# Patient Record
Sex: Female | Born: 1980 | Race: Black or African American | Hispanic: No | Marital: Single | Smoking: Current every day smoker
Health system: Southern US, Community
[De-identification: ages and names within clinical notes are randomized; demographics above are authoritative.]

## PROBLEM LIST (undated history)

## (undated) DIAGNOSIS — I2699 Other pulmonary embolism without acute cor pulmonale: Secondary | ICD-10-CM

## (undated) DIAGNOSIS — E119 Type 2 diabetes mellitus without complications: Secondary | ICD-10-CM

## (undated) DIAGNOSIS — I82409 Acute embolism and thrombosis of unspecified deep veins of unspecified lower extremity: Secondary | ICD-10-CM

## (undated) DIAGNOSIS — G4730 Sleep apnea, unspecified: Secondary | ICD-10-CM

## (undated) DIAGNOSIS — J45909 Unspecified asthma, uncomplicated: Secondary | ICD-10-CM

## (undated) DIAGNOSIS — E079 Disorder of thyroid, unspecified: Secondary | ICD-10-CM

## (undated) HISTORY — PX: OTHER SURGICAL HISTORY: SHX169

## (undated) HISTORY — PX: IVC FILTER PLACEMENT (ARMC HX): HXRAD1551

---

## 2016-01-05 ENCOUNTER — Emergency Department (HOSPITAL_COMMUNITY): Payer: Medicaid - Out of State

## 2016-01-05 ENCOUNTER — Encounter (HOSPITAL_COMMUNITY): Payer: Self-pay | Admitting: Emergency Medicine

## 2016-01-05 ENCOUNTER — Observation Stay (HOSPITAL_COMMUNITY)
Admission: EM | Admit: 2016-01-05 | Discharge: 2016-01-06 | Disposition: A | Discharge status: Home / Self Care | Payer: Medicaid - Out of State | Attending: Internal Medicine | Admitting: Internal Medicine

## 2016-01-05 DIAGNOSIS — Z6841 Body Mass Index (BMI) 40.0 and over, adult: Secondary | ICD-10-CM | POA: Insufficient documentation

## 2016-01-05 DIAGNOSIS — G4730 Sleep apnea, unspecified: Secondary | ICD-10-CM | POA: Diagnosis not present

## 2016-01-05 DIAGNOSIS — I2699 Other pulmonary embolism without acute cor pulmonale: Secondary | ICD-10-CM | POA: Diagnosis present

## 2016-01-05 DIAGNOSIS — J45909 Unspecified asthma, uncomplicated: Secondary | ICD-10-CM | POA: Diagnosis not present

## 2016-01-05 DIAGNOSIS — Z9114 Patient's other noncompliance with medication regimen: Secondary | ICD-10-CM | POA: Diagnosis not present

## 2016-01-05 DIAGNOSIS — M7989 Other specified soft tissue disorders: Secondary | ICD-10-CM | POA: Diagnosis not present

## 2016-01-05 DIAGNOSIS — M79662 Pain in left lower leg: Secondary | ICD-10-CM | POA: Insufficient documentation

## 2016-01-05 DIAGNOSIS — Z86711 Personal history of pulmonary embolism: Secondary | ICD-10-CM | POA: Diagnosis not present

## 2016-01-05 DIAGNOSIS — Z86718 Personal history of other venous thrombosis and embolism: Secondary | ICD-10-CM | POA: Insufficient documentation

## 2016-01-05 DIAGNOSIS — E119 Type 2 diabetes mellitus without complications: Secondary | ICD-10-CM | POA: Diagnosis not present

## 2016-01-05 DIAGNOSIS — Z7901 Long term (current) use of anticoagulants: Secondary | ICD-10-CM | POA: Diagnosis not present

## 2016-01-05 DIAGNOSIS — F17210 Nicotine dependence, cigarettes, uncomplicated: Secondary | ICD-10-CM | POA: Insufficient documentation

## 2016-01-05 HISTORY — DX: Acute embolism and thrombosis of unspecified deep veins of unspecified lower extremity: I82.409

## 2016-01-05 HISTORY — DX: Other pulmonary embolism without acute cor pulmonale: I26.99

## 2016-01-05 HISTORY — DX: Type 2 diabetes mellitus without complications: E11.9

## 2016-01-05 HISTORY — DX: Unspecified asthma, uncomplicated: J45.909

## 2016-01-05 HISTORY — DX: Sleep apnea, unspecified: G47.30

## 2016-01-05 HISTORY — DX: Disorder of thyroid, unspecified: E07.9

## 2016-01-05 LAB — BASIC METABOLIC PANEL
Anion gap: 7 (ref 5–15)
BUN: 13 mg/dL (ref 6–20)
CO2: 29 mmol/L (ref 22–32)
Calcium: 8.9 mg/dL (ref 8.9–10.3)
Chloride: 106 mmol/L (ref 101–111)
Creatinine, Ser: 1 mg/dL (ref 0.44–1.00)
GFR calc Af Amer: >60 mL/min (ref >60)
GFR calc non Af Amer: >60 mL/min (ref >60)
Glucose, Bld: 91 mg/dL (ref 65–99)
Potassium: 4.2 mmol/L (ref 3.5–5.1)
Sodium: 142 mmol/L (ref 135–145)

## 2016-01-05 LAB — GLUCOSE, CAPILLARY: Glucose-Capillary: 96 mg/dL (ref 65–99)

## 2016-01-05 LAB — PROTIME-INR
INR: 1.45 (ref 0.00–1.49)
Prothrombin Time: 17.7 seconds — ABNORMAL HIGH (ref 11.6–15.2)

## 2016-01-05 LAB — CBC WITH DIFFERENTIAL/PLATELET
Basophils Absolute: 0 10*3/uL (ref 0.0–0.1)
Basophils Relative: 0 %
Eosinophils Absolute: 0.1 10*3/uL (ref 0.0–0.7)
Eosinophils Relative: 2 %
HCT: 35.9 % — ABNORMAL LOW (ref 36.0–46.0)
Hemoglobin: 11.4 g/dL — ABNORMAL LOW (ref 12.0–15.0)
Lymphocytes Relative: 30 %
Lymphs Abs: 1.6 10*3/uL (ref 0.7–4.0)
MCH: 25.2 pg — ABNORMAL LOW (ref 26.0–34.0)
MCHC: 31.8 g/dL (ref 30.0–36.0)
MCV: 79.2 fL (ref 78.0–100.0)
Monocytes Absolute: 0.2 10*3/uL (ref 0.1–1.0)
Monocytes Relative: 4 %
Neutro Abs: 3.5 10*3/uL (ref 1.7–7.7)
Neutrophils Relative %: 64 %
Platelets: 362 10*3/uL (ref 150–400)
RBC: 4.53 MIL/uL (ref 3.87–5.11)
RDW: 16.1 % — ABNORMAL HIGH (ref 11.5–15.5)
WBC: 5.4 10*3/uL (ref 4.0–10.5)

## 2016-01-05 LAB — D-DIMER, QUANTITATIVE: D-Dimer, Quant: 1.3 ug/mL-FEU — ABNORMAL HIGH (ref 0.00–0.50)

## 2016-01-05 LAB — HCG, SERUM, QUALITATIVE: Preg, Serum: NEGATIVE

## 2016-01-05 MED ORDER — ENOXAPARIN SODIUM 150 MG/ML ~~LOC~~ SOLN
1.0000 mg/kg | Freq: Two times a day (BID) | SUBCUTANEOUS | Status: DC
  Filled 2016-01-05 (×5): qty 0.86

## 2016-01-05 MED ORDER — MORPHINE SULFATE (PF) 2 MG/ML IV SOLN
2.0000 mg | INTRAVENOUS | Status: DC | PRN
  Administered 2016-01-05: 2 mg via INTRAVENOUS
  Filled 2016-01-05: qty 1

## 2016-01-05 MED ORDER — ONDANSETRON HCL 4 MG PO TABS: 4.0000 mg | ORAL_TABLET | Freq: Four times a day (QID) | ORAL | Status: DC | PRN

## 2016-01-05 MED ORDER — SODIUM CHLORIDE 0.9 % IV SOLN
INTRAVENOUS | Status: DC
  Administered 2016-01-06: 05:00:00 via INTRAVENOUS

## 2016-01-05 MED ORDER — INSULIN ASPART 100 UNIT/ML ~~LOC~~ SOLN
0.0000 [IU] | Freq: Three times a day (TID) | SUBCUTANEOUS | Status: DC
  Administered 2016-01-06: 1 [IU] via SUBCUTANEOUS

## 2016-01-05 MED ORDER — HYDROMORPHONE HCL 1 MG/ML IJ SOLN
2.0000 mg | INTRAMUSCULAR | Status: DC | PRN
  Administered 2016-01-05 – 2016-01-06 (×4): 2 mg via INTRAVENOUS
  Filled 2016-01-05 (×4): qty 2

## 2016-01-05 MED ORDER — ONDANSETRON HCL 4 MG/2ML IJ SOLN: 4.0000 mg | Freq: Four times a day (QID) | INTRAMUSCULAR | Status: DC | PRN

## 2016-01-05 MED ORDER — ONDANSETRON HCL 4 MG/2ML IJ SOLN
4.0000 mg | Freq: Once | INTRAMUSCULAR | Status: AC
  Administered 2016-01-05: 4 mg via INTRAVENOUS
  Filled 2016-01-05: qty 2

## 2016-01-05 MED ORDER — ENOXAPARIN SODIUM 150 MG/ML ~~LOC~~ SOLN
1.0000 mg/kg | Freq: Once | SUBCUTANEOUS | Status: AC
  Administered 2016-01-05: 130 mg via SUBCUTANEOUS
  Filled 2016-01-05: qty 1

## 2016-01-05 MED ORDER — IOHEXOL 350 MG/ML SOLN
100.0000 mL | Freq: Once | INTRAVENOUS | Status: AC | PRN
  Administered 2016-01-05: 100 mL via INTRAVENOUS

## 2016-01-05 MED ORDER — FENTANYL CITRATE (PF) 100 MCG/2ML IJ SOLN
50.0000 ug | INTRAMUSCULAR | Status: DC | PRN
  Administered 2016-01-05: 50 ug via INTRAVENOUS
  Filled 2016-01-05: qty 2

## 2016-01-05 NOTE — ED Notes (Addendum)
Pt reports LT calf pain and edema x 1 week. Pt also reports edema to LT foot. Pt also reports mild SOB. Pt reports chest pain earlier in the week, but reports none today. Pt hx of PE and DVT. Pt on Coumadin.

## 2016-01-05 NOTE — ED Provider Notes (Signed)
CSN: [id number]     Arrival date & time 01/05/16  1708 History   First MD Initiated Contact with Patient 01/05/16 1721     Chief Complaint  Patient presents with  . Leg Pain     HPI  She presents for valuation of leg pain and swelling, chest pain difficult to breathing with exertion. Reports that years ago in [place] she had a pulmonary embolus. She states she thinks this was anywhere from 3-5 years ago but is uncertain. Also was uncertain why, but required IVC filter, which she still has.  Has been on Coumadin since that time. However, she states she frequently forgets to take it on average 3-4 times per week does take her Coumadin. For about a week she's had pain and swelling in her left calf and now shortness of breath and chest pain for the last 2 days, although not today. States she had an ultrasound done by a physician at an urgent care or Hospital in [place] on Thursday and was told that it was negative. She stated "is still getting worse and started having trouble breathing so I came here".  Past Medical History  Diagnosis Date  . Asthma   . DVT (deep venous thrombosis) (HCC)   . Pulmonary emboli (HCC)   . Diabetes mellitus without complication (HCC)   . Thyroid disease   . Sleep apnea    Past Surgical History  Procedure Laterality Date  . Ivc filter placement ([facility] hx)     No family history on file. Social History  Substance Use Topics  . Smoking status: Current Every Day Smoker -- 1.50 packs/day    Types: Cigarettes  . Smokeless tobacco: None  . Alcohol Use: No   OB History    No data available     Review of Systems  Constitutional: Negative for fever, chills, diaphoresis, appetite change and fatigue.  HENT: Negative for mouth sores, sore throat and trouble swallowing.   Eyes: Negative for visual disturbance.  Respiratory: Positive for shortness of breath. Negative for cough, chest tightness and wheezing.   Cardiovascular: Positive for chest pain and leg  swelling.  Gastrointestinal: Negative for nausea, vomiting, abdominal pain, diarrhea and abdominal distention.  Endocrine: Negative for polydipsia, polyphagia and polyuria.  Genitourinary: Negative for dysuria, frequency and hematuria.  Musculoskeletal: Negative for gait problem.  Skin: Negative for color change, pallor and rash.  Neurological: Negative for dizziness, syncope, light-headedness and headaches.  Hematological: Does not bruise/bleed easily.  Psychiatric/Behavioral: Negative for behavioral problems and confusion.      Allergies  Review of patient's allergies indicates no known allergies.  Home Medications   Prior to Admission medications   Medication Sig Start Date End Date Taking? Authorizing Provider  acetaminophen (TYLENOL) 500 MG tablet Take 1,000 mg by mouth every 6 (six) hours as needed for mild pain.   Yes Historical Provider, MD  VENTOLIN HFA 108 (90 Base) MCG/ACT inhaler Inhale 2 puffs into the lungs every 4 (four) hours. 10/29/15  Yes Historical Provider, MD  warfarin (COUMADIN) 10 MG tablet Take 10 mg by mouth daily. 08/26/15  Yes Historical Provider, MD   BP 105/77 mmHg  Pulse 97  Temp(Src) 98.2 F (36.8 C) (Oral)  Resp 20  Ht 5\' 5"  (1.651 m)  Wt 286 lb (129.729 kg)  BMI 47.59 kg/m2  SpO2 93%  LMP 12/29/2015 Physical Exam  Constitutional: She is oriented to person, place, and time. She appears well-developed and well-nourished. No distress.  HENT:  Head: Normocephalic.  Eyes: Conjunctivae are normal. Pupils are equal, round, and reactive to light. No scleral icterus.  Neck: Normal range of motion. Neck supple. No thyromegaly present.  Cardiovascular: Normal rate and regular rhythm.  Exam reveals no gallop and no friction rub.   No murmur heard. Lungs clear. She is not tachypneic or tachycardic.  Pulmonary/Chest: Effort normal and breath sounds normal. No respiratory distress. She has no wheezes. She has no rales.  Abdominal: Soft. Bowel sounds are  normal. She exhibits no distension. There is no tenderness. There is no rebound.  Musculoskeletal: Normal range of motion.  Neurological: She is alert and oriented to person, place, and time.  Skin: Skin is warm and dry. No rash noted.  Increased circumference of left lower leg from the knee and inferiorly. Erythematous. Not warm. Well-perfused.  Psychiatric: She has a normal mood and affect. Her behavior is normal.    ED Course  Procedures (including critical care time) Labs Review Labs Reviewed  CBC WITH DIFFERENTIAL/PLATELET - Abnormal; Notable for the following:    Hemoglobin 11.4 (*)    HCT 35.9 (*)    MCH 25.2 (*)    RDW 16.1 (*)    All other components within normal limits  PROTIME-INR - Abnormal; Notable for the following:    Prothrombin Time 17.7 (*)    All other components within normal limits  D-DIMER, QUANTITATIVE (NOT AT [facility]) - Abnormal; Notable for the following:    D-Dimer, Quant 1.30 (*)    All other components within normal limits  BASIC METABOLIC PANEL  HCG, SERUM, QUALITATIVE    Imaging Review Ct Angio Chest Pe W/cm &/or Wo Cm  01/05/2016  CLINICAL DATA:  Left calf pain and swelling for 1 week. Chest pain and shortness of breath. EXAM: CT ANGIOGRAPHY CHEST WITH CONTRAST TECHNIQUE: Multidetector CT imaging of the chest was performed using the standard protocol during bolus administration of intravenous contrast. Multiplanar CT image reconstructions and MIPs were obtained to evaluate the vascular anatomy. CONTRAST:  OMNIPAQUE IOHEXOL 350 MG/ML SOLN COMPARISON:  None. FINDINGS: Mediastinum/Nodes: No breast masses, supraclavicular or axillary adenopathy. Small scattered lymph nodes are noted bilaterally. The heart is normal in size. No pericardial effusion. The aorta is normal in caliber. No dissection. The branch vessels are patent. Suboptimal opacification of the pulmonary arteries but there are bilateral filling defects consistent with pulmonary embolism  involving second and third order vessels. Small scattered mediastinal and hilar lymph nodes but no mass or adenopathy. Lungs/Pleura: No acute pulmonary findings. No worrisome pulmonary lesions. No pleural effusion. Upper abdomen: The upper abdomen is unremarkable. Musculoskeletal: No significant bony findings. Review of the MIP images confirms the above findings. IMPRESSION: 1. Suboptimal examination but definite findings for bilateral pulmonary emboli. 2. Normal thoracic aorta. 3. No acute pulmonary findings. Electronically Signed   By: Rudie Meyer M.D.   On: 01/05/2016 19:39   I have personally reviewed and evaluated these images and lab results as part of my medical decision-making.   EKG Interpretation   Date/Time:  Sunday Jan 05 2016 17:32:31 EDT Ventricular Rate:  94 PR Interval:  153 QRS Duration: 88 QT Interval:  337 QTC Calculation: 421 R Axis:   55 Text Interpretation:  Sinus rhythm Confirmed by Fayrene Fearing  MD, Jillaine Waren ([id number]) on  01/05/2016 5:39:15 PM      MDM   Final diagnoses:  Other acute pulmonary embolism without acute cor pulmonale (HCC)    Patient is uncertain about her previous embolus. She thinks it was anywhere from  3-5 years ago in [place] urgent. Today on repeat CT does have multiple small segmental pulmonary emboli. Clinically, radiographically, no sign of right heart strain. Hearing is stable. Not hypoxemic or tachycardic.  I discussed the case with Dr. Sharl Ma. Patient given Lovenox. Will be admitted.    Rolland Porter, MD 01/05/16 2032

## 2016-01-05 NOTE — H&P (Signed)
TRH H&P   Patient Demographics:    Donna Douglas, is a 35 y.o. female  MRN: [id number]   DOB - 15-Sep-1980  Admit Date - 01/05/2016  Outpatient Primary MD for the patient is PROVIDER NOT IN SYSTEM  Referring MD/NP/PA:  Dr. Fayrene Fearing Outpatient Specialists: cardiology  Patient coming from: home  Chief Complaint  Patient presents with  . Leg Pain      HPI:    Donna Douglas  is a 35 y.o. female, with history of DVT, embolism, diabetes mellitus who came to the hospital for worsening leg pain and swelling. Patient has a history of pulmonary embolism, DVT, status post IVC filter placed in 2008. She takes Coumadin at home, and says that she has been noncompliant with taking medications. Patient was seen at [facility] 2 days ago, where she underwent ultrasound of the lower extremity which was negative for DVT. She continues to have cramping pain in the left lower extremity, so today she came to the hospital. She also has developed shortness of breath on exertion.  Patient started taking birth control pills norethindrone a month ago, and was told that it is a low risk for developing DVT.  In the ED CTA chest was done which showed bilateral pulmonary embolism. She denies nausea vomiting or diarrhea. Denies chest pain.      Review of systems:    In addition to the HPI above,  No Fever-chills, No Headache, No changes with Vision or hearing, No problems swallowing food or Liquids,  No Abdominal pain, No Nausea or Vommitting, Bowel movements are regular, No Blood in stool or Urine, No dysuria, No new skin rashes or bruises, No new joints pains-aches,  No new weakness, tingling, numbness in any extremity, No recent weight gain or loss, No polyuria, polydypsia or polyphagia, No significant Mental Stressors.  A full 10 point Review of Systems was done, except as stated above, all other Review of  Systems were negative.   With Past History of the following :    Past Medical History  Diagnosis Date  . Asthma   . DVT (deep venous thrombosis) (HCC)   . Pulmonary emboli (HCC)   . Diabetes mellitus without complication (HCC)   . Thyroid disease   . Sleep apnea       Past Surgical History  Procedure Laterality Date  . Ivc filter placement ([facility] hx)        Social History:     Social History  Substance Use Topics  . Smoking status: Current Every Day Smoker -- 1.50 packs/day    Types: Cigarettes  . Smokeless tobacco: Not on file  . Alcohol Use: No     Lives - At home  Mobility - no limitation of mobility     Family History :   No family history of heart problems, no stroke, no history of blood clots in family   Home Medications:   Prior to Admission medications   Medication Sig Start Date End Date Taking? Authorizing Provider  acetaminophen (  TYLENOL) 500 MG tablet Take 1,000 mg by mouth every 6 (six) hours as needed for mild pain.   Yes Historical Provider, MD  VENTOLIN HFA 108 (90 Base) MCG/ACT inhaler Inhale 2 puffs into the lungs every 4 (four) hours. 10/29/15  Yes Historical Provider, MD  warfarin (COUMADIN) 10 MG tablet Take 10 mg by mouth daily. 08/26/15  Yes Historical Provider, MD     Allergies:    No Known Allergies   Physical Exam:   Vitals  Blood pressure 125/84, pulse 94, temperature 98.2 F (36.8 C), temperature source Oral, resp. rate 20, height  (1.651 m), weight 129.729 kg (286 lb), last menstrual period 12/29/2015, SpO2 98 %.   1. General Obese female lying in bed in NAD, cooperative with exam  2. Normal affect and insight, Not Suicidal or Homicidal, Awake Alert, Oriented X 3.  3. No F.N deficits, ALL C.Nerves Intact, Strength 5/5 all 4 extremities, Sensation intact all 4 extremities, Plantars down going.  4. Ears and Eyes appear Normal, Conjunctivae clear, PERRLA. Moist Oral Mucosa.  5. Supple Neck, No JVD, No cervical  lymphadenopathy appriciated, No Carotid Bruits.  6. Symmetrical Chest wall movement, Good air movement bilaterally, CTAB. Bilateral edema of the lower extremities, left more than right. Homans sign positive  7. RRR, No Gallops, Rubs or Murmurs, No Parasternal Heave.  8. Positive Bowel Sounds, Abdomen Soft, No tenderness, No organomegaly appriciated,No rebound -guarding or rigidity.  9.  No Cyanosis, Normal Skin Turgor, No Skin Rash or Bruise.  10. Good muscle tone,  joints appear normal , no effusions, Normal ROM.      Data Review:    CBC  Recent Labs Lab 01/05/16 1742  WBC 5.4  HGB 11.4*  HCT 35.9*  PLT 362  MCV 79.2  MCH 25.2*  MCHC 31.8  RDW 16.1*  LYMPHSABS 1.6  MONOABS 0.2  EOSABS 0.1  BASOSABS 0.0   ------------------------------------------------------------------------------------------------------------------  Chemistries   Recent Labs Lab 01/05/16 1742  NA 142  K 4.2  CL 106  CO2 29  GLUCOSE 91  BUN 13  CREATININE 1.00  CALCIUM 8.9   ------------------------------------------------------------------------------------------------------------------ estimated creatinine clearance is 106.7 mL/min (by C-G formula based on Cr of 1). ------------------------------------------------------------------------------------------------------------------ No results for input(s): TSH, T4TOTAL, T3FREE, THYROIDAB in the last 72 hours.  Invalid input(s): FREET3  Coagulation profile  Recent Labs Lab 01/05/16 1742  INR 1.45   -------------------------------------------------------------------------------------------------------------------  Recent Labs  01/05/16 1742  DDIMER 1.30*   -------------------------------------------------------------------------------------------------------------------  Cardiac Enzymes No results for input(s): CKMB, TROPONINI, MYOGLOBIN in the last 168 hours.  Invalid input(s):  CK ------------------------------------------------------------------------------------------------------------------ No results found for: BNP   ---------------------------------------------------------------------------------------------------------------  Urinalysis No results found for: COLORURINE, APPEARANCEUR, LABSPEC, PHURINE, GLUCOSEU, HGBUR, BILIRUBINUR, KETONESUR, PROTEINUR, UROBILINOGEN, NITRITE, LEUKOCYTESUR  ----------------------------------------------------------------------------------------------------------------   Imaging Results:    Ct Angio Chest Pe W/cm &/or Wo Cm  01/05/2016  CLINICAL DATA:  Left calf pain and swelling for 1 week. Chest pain and shortness of breath. EXAM: CT ANGIOGRAPHY CHEST WITH CONTRAST TECHNIQUE: Multidetector CT imaging of the chest was performed using the standard protocol during bolus administration of intravenous contrast. Multiplanar CT image reconstructions and MIPs were obtained to evaluate the vascular anatomy. CONTRAST:  OMNIPAQUE IOHEXOL 350 MG/ML SOLN COMPARISON:  None. FINDINGS: Mediastinum/Nodes: No breast masses, supraclavicular or axillary adenopathy. Small scattered lymph nodes are noted bilaterally. The heart is normal in size. No pericardial effusion. The aorta is normal in caliber. No dissection. The branch vessels are patent. Suboptimal opacification of the pulmonary  arteries but there are bilateral filling defects consistent with pulmonary embolism involving second and third order vessels. Small scattered mediastinal and hilar lymph nodes but no mass or adenopathy. Lungs/Pleura: No acute pulmonary findings. No worrisome pulmonary lesions. No pleural effusion. Upper abdomen: The upper abdomen is unremarkable. Musculoskeletal: No significant bony findings. Review of the MIP images confirms the above findings. IMPRESSION: 1. Suboptimal examination but definite findings for bilateral pulmonary emboli. 2. Normal thoracic aorta. 3. No  acute pulmonary findings. Electronically Signed   By: Rudie MeyerP.  Gallerani M.D.   On: 01/05/2016 19:39    My personal review of EKG: Rhythm NSR, Rate  *94 /min, QTc 421, no Acute ST changes   Assessment & Plan:    Active Problems:   Pulmonary emboli (HCC)   Pulmonary embolism (HCC)     1. Pulmonary embolism- CT angiogram of the chest showed findings for bilateral pulmonary embolism, will admit the patient and start Lovenox full dose. Hold Coumadin at this time. Patient might need a NOAC at the time of discharge. Also recommended to stop taking Norethindrone birth control pills.  2. Left lower extremity swelling- we'll obtain bilateral venous duplex in a.m. to rule out DVT. Patient already has IVC filter in place. Continue Lovenox as above. 3. Diabetes mellitus- patient was taking Januvia sometime ago, she is currently not taking medication as she has no PCP. Will check hemoglobin A1c and start sliding scale insulin with NovoLog.   DVT Prophylaxis-   Lovenox   AM Labs Ordered, also please review Full Orders  Family Communication: No family present at bedside  Code Status Full code  Admission status: *Observation  Time spent in minutes : 60 minutes   Tanina Barb S M.D on 01/05/2016 at 8:59 PM  Between 7am to 7pm - Pager - [phone]. After 7pm go to [email or web address] - password [email or web address]  [facility] - Office  [phone]

## 2016-01-06 ENCOUNTER — Observation Stay (HOSPITAL_COMMUNITY): Payer: Medicaid - Out of State

## 2016-01-06 ENCOUNTER — Other Ambulatory Visit (HOSPITAL_COMMUNITY): Payer: Medicaid - Out of State

## 2016-01-06 ENCOUNTER — Encounter (HOSPITAL_COMMUNITY): Payer: Self-pay | Admitting: *Deleted

## 2016-01-06 DIAGNOSIS — I2609 Other pulmonary embolism with acute cor pulmonale: Secondary | ICD-10-CM

## 2016-01-06 LAB — GLUCOSE, CAPILLARY
Glucose-Capillary: 107 mg/dL — ABNORMAL HIGH (ref 65–99)
Glucose-Capillary: 131 mg/dL — ABNORMAL HIGH (ref 65–99)
Glucose-Capillary: 145 mg/dL — ABNORMAL HIGH (ref 65–99)
Glucose-Capillary: 83 mg/dL (ref 65–99)

## 2016-01-06 LAB — COMPREHENSIVE METABOLIC PANEL
ALT: 13 U/L — ABNORMAL LOW (ref 14–54)
AST: 11 U/L — ABNORMAL LOW (ref 15–41)
Albumin: 3.3 g/dL — ABNORMAL LOW (ref 3.5–5.0)
Alkaline Phosphatase: 86 U/L (ref 38–126)
Anion gap: 6 (ref 5–15)
BUN: 11 mg/dL (ref 6–20)
CO2: 28 mmol/L (ref 22–32)
Calcium: 8.6 mg/dL — ABNORMAL LOW (ref 8.9–10.3)
Chloride: 104 mmol/L (ref 101–111)
Creatinine, Ser: 0.86 mg/dL (ref 0.44–1.00)
GFR calc Af Amer: >60 mL/min (ref >60)
GFR calc non Af Amer: >60 mL/min (ref >60)
Glucose, Bld: 126 mg/dL — ABNORMAL HIGH (ref 65–99)
Potassium: 4.1 mmol/L (ref 3.5–5.1)
Sodium: 138 mmol/L (ref 135–145)
Total Bilirubin: 0.2 mg/dL — ABNORMAL LOW (ref 0.3–1.2)
Total Protein: 6.7 g/dL (ref 6.5–8.1)

## 2016-01-06 LAB — CBC
HCT: 34.7 % — ABNORMAL LOW (ref 36.0–46.0)
Hemoglobin: 11.1 g/dL — ABNORMAL LOW (ref 12.0–15.0)
MCH: 25.7 pg — ABNORMAL LOW (ref 26.0–34.0)
MCHC: 32 g/dL (ref 30.0–36.0)
MCV: 80.3 fL (ref 78.0–100.0)
Platelets: 332 10*3/uL (ref 150–400)
RBC: 4.32 MIL/uL (ref 3.87–5.11)
RDW: 16.2 % — ABNORMAL HIGH (ref 11.5–15.5)
WBC: 5.7 10*3/uL (ref 4.0–10.5)

## 2016-01-06 MED ORDER — APIXABAN 5 MG PO TABS: 5.0000 mg | ORAL_TABLET | Freq: Two times a day (BID) | ORAL | Status: DC

## 2016-01-06 MED ORDER — DIPHENHYDRAMINE HCL 25 MG PO CAPS
25.0000 mg | ORAL_CAPSULE | Freq: Once | ORAL | Status: AC
  Administered 2016-01-06: 25 mg via ORAL
  Filled 2016-01-06: qty 1

## 2016-01-06 MED ORDER — APIXABAN 5 MG PO TABS: 10.0000 mg | ORAL_TABLET | Freq: Two times a day (BID) | ORAL | Status: DC

## 2016-01-06 MED ORDER — APIXABAN 5 MG PO TABS
10.0000 mg | ORAL_TABLET | Freq: Two times a day (BID) | ORAL | Status: DC
  Administered 2016-01-06: 10 mg via ORAL
  Filled 2016-01-06: qty 2

## 2016-01-06 MED ORDER — IBUPROFEN 100 MG/5ML PO SUSP
400.0000 mg | Freq: Three times a day (TID) | ORAL | Status: DC | PRN
  Administered 2016-01-06: 400 mg via ORAL
  Filled 2016-01-06: qty 20

## 2016-01-06 NOTE — Progress Notes (Signed)
ANTICOAGULATION CONSULT NOTE - Initial Consult  [facility]  No Known Allergies  Patient Measurements: Height: 5\' 5"  (165.1 cm) Weight: 286 lb (129.729 kg) IBW/kg (Calculated) : 57  Vital Signs: Temp: 97.9 F (36.6 C) (05/08 0516) Temp Source: Oral (05/08 0516) BP: 116/72 mmHg (05/08 0516) Pulse Rate: 80 (05/08 0516)  Labs:  Recent Labs  01/05/16 1742 01/06/16 0507  HGB 11.4* 11.1*  HCT 35.9* 34.7*  PLT 362 332  LABPROT 17.7*  --   INR 1.45  --   CREATININE 1.00 0.86   Estimated Creatinine Clearance: 124.1 mL/min (by C-G formula based on Cr of 0.86).  Medical History: Past Medical History  Diagnosis Date  . Asthma   . DVT (deep venous thrombosis) (HCC)   . Pulmonary emboli (HCC)   . Diabetes mellitus without complication (HCC)   . Thyroid disease   . Sleep apnea    Medications:  Prescriptions prior to admission  Medication Sig Dispense Refill Last Dose  . acetaminophen (TYLENOL) 500 MG tablet Take 1,000 mg by mouth every 6 (six) hours as needed for mild pain.   01/05/2016  . VENTOLIN HFA 108 (90 Base) MCG/ACT inhaler Inhale 2 puffs into the lungs every 4 (four) hours.  0 01/05/2016  . warfarin (COUMADIN) 10 MG tablet Take 10 mg by mouth daily.   01/04/2016 at 1930   Assessment: 35yo female with h/o DVT, diabetes, PE who presented for worsening leg pain and swelling.  Pt was started on Lovenox.  Pt was reportedly on Coumadin PTA.  INR was 1.45 yesterday when checked.  CBC appears stable.  Pt received last dose of Lovenox last pm.  Now asked to switch pt to Eliquis for treatment of PE.  Goal of Therapy:  Full dose anticoagulation for PE Monitor platelets by anticoagulation protocol: Yes   Plan:  Eliquis 10mg  PO BID x 7 days then 5mg  PO BID thereafter Provide education Monitor for s/sx of bleeding complications  Valrie HartHall, Chelsye Suhre A 01/06/2016,11:40 AM

## 2016-01-06 NOTE — Discharge Instructions (Signed)
Information on my medicine - ELIQUIS (apixaban)  This medication education was reviewed with me or my healthcare representative as part of my discharge preparation.  The pharmacist that spoke with me during my hospital stay was:  Wayland DenisHall, Donaven Criswell A, [facility] Center[facility]PH  Why was Eliquis prescribed for you? Eliquis was prescribed to treat blood clots that may have been found in the veins of your legs (deep vein thrombosis) or in your lungs (pulmonary embolism) and to reduce the risk of them occurring again.  What do You need to know about Eliquis ? The starting dose is 10 mg (two 5 mg tablets) taken TWICE daily for the FIRST SEVEN (7) DAYS, then on  01/13/16  the dose is reduced to ONE 5 mg tablet taken TWICE daily.  Eliquis may be taken with or without food.   Try to take the dose about the same time in the morning and in the evening. If you have difficulty swallowing the tablet whole please discuss with your pharmacist how to take the medication safely.  Take Eliquis exactly as prescribed and DO NOT stop taking Eliquis without talking to the doctor who prescribed the medication.  Stopping may increase your risk of developing a new blood clot.  Refill your prescription before you run out.  After discharge, you should have regular check-up appointments with your healthcare provider that is prescribing your Eliquis.    What do you do if you miss a dose? If a dose of ELIQUIS is not taken at the scheduled time, take it as soon as possible on the same day and twice-daily administration should be resumed. The dose should not be doubled to make up for a missed dose.  Important Safety Information A possible side effect of Eliquis is bleeding. You should call your healthcare provider right away if you experience any of the following: ? Bleeding from an injury or your nose that does not stop. ? Unusual colored urine (red or dark brown) or unusual colored stools (red or black). ? Unusual bruising for unknown  reasons. ? A serious fall or if you hit your head (even if there is no bleeding).  Some medicines may interact with Eliquis and might increase your risk of bleeding or clotting while on Eliquis. To help avoid this, consult your healthcare provider or pharmacist prior to using any new prescription or non-prescription medications, including herbals, vitamins, non-steroidal anti-inflammatory drugs (NSAIDs) and supplements.  This website has more information on Eliquis (apixaban): http://[email or web address].com/eliquis/home

## 2016-01-06 NOTE — Progress Notes (Signed)
Donna Douglas discharged Home per MD order.  Discharge instructions reviewed and discussed with the patient, all questions and concerns answered. Copy of instructions and scripts given to patient.    Medication List    STOP taking these medications        warfarin 10 MG tablet  Commonly known as:  COUMADIN      TAKE these medications        acetaminophen 500 MG tablet  Commonly known as:  TYLENOL  Take 1,000 mg by mouth every 6 (six) hours as needed for mild pain.     apixaban 5 MG Tabs tablet  Commonly known as:  ELIQUIS  Take 2 tablets (10 mg total) by mouth 2 (two) times daily.  Notes to Patient:  Take 10 mg twice a day for 7 days then start 5mg  twice a day     apixaban 5 MG Tabs tablet  Commonly known as:  ELIQUIS  Take 1 tablet (5 mg total) by mouth 2 (two) times daily.  Start taking on:  01/13/2016     VENTOLIN HFA 108 (90 Base) MCG/ACT inhaler  Generic drug:  albuterol  Inhale 2 puffs into the lungs every 4 (four) hours.        Patients skin is clean, dry and intact, no evidence of skin break down. IV site discontinued and catheter remains intact. Site without signs and symptoms of complications. Dressing and pressure applied.  Patient escorted to car by NT in a wheelchair,  no distress noted upon discharge.  Rica KoyanagiBonnie M Parke Jandreau 01/06/2016 7:29 PM

## 2016-01-06 NOTE — Discharge Summary (Signed)
Physician Discharge Summary  Donna Douglas RUE:[id number]:[id number] DOB: 30-Oct-1980 DOA: 01/05/2016  PCP: PROVIDER NOT IN SYSTEM  Admit date: 01/05/2016 Discharge date: 01/06/2016  Time spent: 45 minutes  Recommendations for Outpatient Follow-up:  -We'll be discharged home today. -Advised to follow-up with primary care provider in 2 weeks.   Discharge Diagnoses:  Active Problems:   Pulmonary emboli (HCC)   Pulmonary embolism (HCC)   Discharge Condition: Stable and improved  Filed Weights   01/05/16 1718  Weight: 129.729 kg (286 lb)    History of present illness:  As per Dr. Sharl MaLama on 5/7: Donna Ihaiffany Billinger is a 35 y.o. female, with history of DVT, embolism, diabetes mellitus who came to the hospital for worsening leg pain and swelling. Patient has a history of pulmonary embolism, DVT, status post IVC filter placed in 2008. She takes Coumadin at home, and says that she has been noncompliant with taking medications. Patient was seen [facility] ago, where she underwent ultrasound of the lower extremity which was negative for DVT. She continues to have cramping pain in the left lower extremity, so today she came to the hospital. She also has developed shortness of breath on exertion.  Patient started taking birth control pills norethindrone a month ago, and was told that it is a low risk for developing DVT.  In the ED CTA chest was done which showed bilateral pulmonary embolism. She denies nausea vomiting or diarrhea. Denies chest pain.   Hospital Course:   Acute pulmonary embolism -This is provoked as she has recently started birth control pills and on top of that is a heavy smoker. She also has a prior history of DVT. -I have advised her to immediately discontinue use of birth control pills and that she is no longer a candidate for any hormonal type of birth control, have also advised on smoking cessation to reduce her risk of thromboembolism.  -She has been started on  Eliquis for treatment of her PE. Given her prior history of DVT I do believe that she would be a lifelong anticoagulation candidate although I would at least treat her for 1 year.  Morbid obesity -Noted  Procedures:  None   Consultations:  None  Discharge Instructions  Discharge Instructions    Diet - low sodium heart healthy    Complete by:  As directed      Increase activity slowly    Complete by:  As directed             Medication List    STOP taking these medications        warfarin 10 MG tablet  Commonly known as:  COUMADIN      TAKE these medications        acetaminophen 500 MG tablet  Commonly known as:  TYLENOL  Take 1,000 mg by mouth every 6 (six) hours as needed for mild pain.     apixaban 5 MG Tabs tablet  Commonly known as:  ELIQUIS  Take 2 tablets (10 mg total) by mouth 2 (two) times daily.     apixaban 5 MG Tabs tablet  Commonly known as:  ELIQUIS  Take 1 tablet (5 mg total) by mouth 2 (two) times daily.  Start taking on:  01/13/2016     VENTOLIN HFA 108 (90 Base) MCG/ACT inhaler  Generic drug:  albuterol  Inhale 2 puffs into the lungs every 4 (four) hours.       No Known Allergies  Follow-up Information    Schedule an appointment as soon as possible for a visit in 2 weeks to follow up.   Why:  with your regular physician       The results of significant diagnostics from this hospitalization (including imaging, microbiology, ancillary and laboratory) are listed below for reference.    Significant Diagnostic Studies: Ct Angio Chest Pe W/cm &/or Wo Cm  01/05/2016  CLINICAL DATA:  Left calf pain and swelling for 1 week. Chest pain and shortness of breath. EXAM: CT ANGIOGRAPHY CHEST WITH CONTRAST TECHNIQUE: Multidetector CT imaging of the chest was performed using the standard protocol during bolus administration of intravenous contrast. Multiplanar CT image reconstructions and MIPs were obtained to evaluate the vascular anatomy.  CONTRAST:  OMNIPAQUE IOHEXOL 350 MG/ML SOLN COMPARISON:  None. FINDINGS: Mediastinum/Nodes: No breast masses, supraclavicular or axillary adenopathy. Small scattered lymph nodes are noted bilaterally. The heart is normal in size. No pericardial effusion. The aorta is normal in caliber. No dissection. The branch vessels are patent. Suboptimal opacification of the pulmonary arteries but there are bilateral filling defects consistent with pulmonary embolism involving second and third order vessels. Small scattered mediastinal and hilar lymph nodes but no mass or adenopathy. Lungs/Pleura: No acute pulmonary findings. No worrisome pulmonary lesions. No pleural effusion. Upper abdomen: The upper abdomen is unremarkable. Musculoskeletal: No significant bony findings. Review of the MIP images confirms the above findings. IMPRESSION: 1. Suboptimal examination but definite findings for bilateral pulmonary emboli. 2. Normal thoracic aorta. 3. No acute pulmonary findings. Electronically Signed   By: Rudie Meyer M.D.   On: 01/05/2016 19:39   US Venous Img Lower Bilateral  01/06/2016  CLINICAL DATA:  Leg swelling for 1 week EXAM: BILATERAL LOWER EXTREMITY VENOUS DUPLEX ULTRASOUND TECHNIQUE: Doppler venous assessment of the bilateral lower extremity deep venous system was performed, including characterization of spectral flow, compressibility, and phasicity. COMPARISON:  None. FINDINGS: There is complete compressibility of the bilateral common femoral, femoral, and popliteal veins. Doppler analysis demonstrates respiratory phasicity and augmentation of flow upon calf compression. No evidence of calf vein thrombosis. IMPRESSION: No evidence of lower extremity DVT. Electronically Signed   By: Jolaine Click M.D.   On: 01/06/2016 13:42    Microbiology: No results found for this or any previous visit (from the past 240 hour(s)).   Labs: Basic Metabolic Panel:  Recent Labs Lab 01/05/16 1742 01/06/16 0507  NA 142  138  K 4.2 4.1  CL 106 104  CO2 29 28  GLUCOSE 91 126*  BUN 13 11  CREATININE 1.00 0.86  CALCIUM 8.9 8.6*   Liver Function Tests:  Recent Labs Lab 01/06/16 0507  AST 11*  ALT 13*  ALKPHOS 86  BILITOT 0.2*  PROT 6.7  ALBUMIN 3.3*   No results for input(s): LIPASE, AMYLASE in the last 168 hours. No results for input(s): AMMONIA in the last 168 hours. CBC:  Recent Labs Lab 01/05/16 1742 01/06/16 0507  WBC 5.4 5.7  NEUTROABS 3.5  --   HGB 11.4* 11.1*  HCT 35.9* 34.7*  MCV 79.2 80.3  PLT 362 332   Cardiac Enzymes: No results for input(s): CKTOTAL, CKMB, CKMBINDEX, TROPONINI in the last 168 hours. BNP: BNP (last 3 results) No results for input(s): BNP in the last 8760 hours.  ProBNP (last 3 results) No results for input(s): PROBNP in the last 8760 hours.  CBG:  Recent Labs Lab 01/05/16 2246 01/06/16 0748 01/06/16 1111 01/06/16 1114  GLUCAP 96 107* 145* 131*  SignedChaya Jan  [facility] Pager: [phone] 01/06/2016, 3:42 PM

## 2016-01-07 LAB — HEMOGLOBIN A1C
Hgb A1c MFr Bld: 6.7 % — ABNORMAL HIGH (ref 4.8–5.6)
Mean Plasma Glucose: 146 mg/dL

## 2016-03-07 ENCOUNTER — Emergency Department (HOSPITAL_COMMUNITY): Payer: Medicaid - Out of State

## 2016-03-07 ENCOUNTER — Encounter (HOSPITAL_COMMUNITY): Payer: Self-pay | Admitting: *Deleted

## 2016-03-07 ENCOUNTER — Emergency Department (HOSPITAL_COMMUNITY)
Admission: EM | Admit: 2016-03-07 | Discharge: 2016-03-07 | Disposition: A | Discharge status: Home / Self Care | Payer: Medicaid - Out of State | Attending: Emergency Medicine | Admitting: Emergency Medicine

## 2016-03-07 DIAGNOSIS — Z7901 Long term (current) use of anticoagulants: Secondary | ICD-10-CM | POA: Diagnosis not present

## 2016-03-07 DIAGNOSIS — F17210 Nicotine dependence, cigarettes, uncomplicated: Secondary | ICD-10-CM | POA: Insufficient documentation

## 2016-03-07 DIAGNOSIS — M79671 Pain in right foot: Secondary | ICD-10-CM | POA: Insufficient documentation

## 2016-03-07 DIAGNOSIS — E119 Type 2 diabetes mellitus without complications: Secondary | ICD-10-CM | POA: Insufficient documentation

## 2016-03-07 DIAGNOSIS — Z79899 Other long term (current) drug therapy: Secondary | ICD-10-CM | POA: Diagnosis not present

## 2016-03-07 DIAGNOSIS — J45909 Unspecified asthma, uncomplicated: Secondary | ICD-10-CM | POA: Insufficient documentation

## 2016-03-07 MED ORDER — KETOROLAC TROMETHAMINE 60 MG/2ML IM SOLN: 60.0000 mg | Freq: Once | INTRAMUSCULAR | Status: DC

## 2016-03-07 MED ORDER — OXYCODONE-ACETAMINOPHEN 7.5-325 MG PO TABS: 1.0000 | ORAL_TABLET | ORAL | Status: DC | PRN

## 2016-03-07 MED ORDER — OXYCODONE-ACETAMINOPHEN 5-325 MG PO TABS
2.0000 | ORAL_TABLET | Freq: Once | ORAL | Status: AC
  Administered 2016-03-07: 2 via ORAL
  Filled 2016-03-07: qty 2

## 2016-03-07 NOTE — Discharge Instructions (Signed)
Use crutches as needed. Stop taking ibuprofen - you should not take any NSAID's while on the blood thinner. Apply ice several times a day.  Plantar Fasciitis Plantar fasciitis is a painful foot condition that affects the heel. It occurs when the band of tissue that connects the toes to the heel bone (plantar fascia) becomes irritated. This can happen after exercising too much or doing other repetitive activities (overuse injury). The pain from plantar fasciitis can range from mild irritation to severe pain that makes it difficult for you to walk or move. The pain is usually worse in the morning or after you have been sitting or lying down for a while. CAUSES This condition may be caused by:  Standing for long periods of time.  Wearing shoes that do not fit.  Doing high-impact activities, including running, aerobics, and ballet.  Being overweight.  Having an abnormal way of walking (gait).  Having tight calf muscles.  Having high arches in your feet.  Starting a new athletic activity. SYMPTOMS The main symptom of this condition is heel pain. Other symptoms include:  Pain that gets worse after activity or exercise.  Pain that is worse in the morning or after resting.  Pain that goes away after you walk for a few minutes. DIAGNOSIS This condition may be diagnosed based on your signs and symptoms. Your health care provider will also do a physical exam to check for:  A tender area on the bottom of your foot.  A high arch in your foot.  Pain when you move your foot.  Difficulty moving your foot. You may also need to have imaging studies to confirm the diagnosis. These can include:  X-rays.  Ultrasound.  MRI. TREATMENT  Treatment for plantar fasciitis depends on the severity of the condition. Your treatment may include:  Rest, ice, and over-the-counter pain medicines to manage your pain.  Exercises to stretch your calves and your plantar fascia.  A splint that holds  your foot in a stretched, upward position while you sleep (night splint).  Physical therapy to relieve symptoms and prevent problems in the future.  Cortisone injections to relieve severe pain.  Extracorporeal shock wave therapy (ESWT) to stimulate damaged plantar fascia with electrical impulses. It is often used as a last resort before surgery.  Surgery, if other treatments have not worked after 12 months. HOME CARE INSTRUCTIONS  Take medicines only as directed by your health care provider.  Avoid activities that cause pain.  Roll the bottom of your foot over a bag of ice or a bottle of cold water. Do this for 20 minutes, 3-4 times a day.  Perform simple stretches as directed by your health care provider.  Try wearing athletic shoes with air-sole or gel-sole cushions or soft shoe inserts.  Wear a night splint while sleeping, if directed by your health care provider.  Keep all follow-up appointments with your health care provider. PREVENTION   Do not perform exercises or activities that cause heel pain.  Consider finding low-impact activities if you continue to have problems.  Lose weight if you need to. The best way to prevent plantar fasciitis is to avoid the activities that aggravate your plantar fascia. SEEK MEDICAL CARE IF:  Your symptoms do not go away after treatment with home care measures.  Your pain gets worse.  Your pain affects your ability to move or do your daily activities.   This information is not intended to replace advice given to you by your health care  provider. Make sure you discuss any questions you have with your health care provider.   Document Released: 05/12/2001 Document Revised: 05/08/2015 Document Reviewed: 06/27/2014 Elsevier Interactive Patient Education 2016 Elsevier Inc.  Acetaminophen; Oxycodone tablets What is this medicine? ACETAMINOPHEN; OXYCODONE (a set a MEE noe fen; ox i KOE done) is a pain reliever. It is used to treat moderate  to severe pain. This medicine may be used for other purposes; ask your health care provider or pharmacist if you have questions. What should I tell my health care provider before I take this medicine? They need to know if you have any of these conditions: -brain tumor -Crohn's disease, inflammatory bowel disease, or ulcerative colitis -drug abuse or addiction -head injury -heart or circulation problems -if you often drink alcohol -kidney disease or problems going to the bathroom -liver disease -lung disease, asthma, or breathing problems -an unusual or allergic reaction to acetaminophen, oxycodone, other opioid analgesics, other medicines, foods, dyes, or preservatives -pregnant or trying to get pregnant -breast-feeding How should I use this medicine? Take this medicine by mouth with a full glass of water. Follow the directions on the prescription label. You can take it with or without food. If it upsets your stomach, take it with food. Take your medicine at regular intervals. Do not take it more often than directed. Talk to your pediatrician regarding the use of this medicine in children. Special care may be needed. Patients over 49 years old may have a stronger reaction and need a smaller dose. Overdosage: If you think you have taken too much of this medicine contact a poison control center or emergency room at once. NOTE: This medicine is only for you. Do not share this medicine with others. What if I miss a dose? If you miss a dose, take it as soon as you can. If it is almost time for your next dose, take only that dose. Do not take double or extra doses. What may interact with this medicine? -alcohol -antihistamines -barbiturates like amobarbital, butalbital, butabarbital, methohexital, pentobarbital, phenobarbital, thiopental, and secobarbital -benztropine -drugs for bladder problems like solifenacin, trospium, oxybutynin, tolterodine, hyoscyamine, and methscopolamine -drugs for  breathing problems like ipratropium and tiotropium -drugs for certain stomach or intestine problems like propantheline, homatropine methylbromide, glycopyrrolate, atropine, belladonna, and dicyclomine -general anesthetics like etomidate, ketamine, nitrous oxide, propofol, desflurane, enflurane, halothane, isoflurane, and sevoflurane -medicines for depression, anxiety, or psychotic disturbances -medicines for sleep -muscle relaxants -naltrexone -narcotic medicines (opiates) for pain -phenothiazines like perphenazine, thioridazine, chlorpromazine, mesoridazine, fluphenazine, prochlorperazine, promazine, and trifluoperazine -scopolamine -tramadol -trihexyphenidyl This list may not describe all possible interactions. Give your health care provider a list of all the medicines, herbs, non-prescription drugs, or dietary supplements you use. Also tell them if you smoke, drink alcohol, or use illegal drugs. Some items may interact with your medicine. What should I watch for while using this medicine? Tell your doctor or health care professional if your pain does not go away, if it gets worse, or if you have new or a different type of pain. You may develop tolerance to the medicine. Tolerance means that you will need a higher dose of the medication for pain relief. Tolerance is normal and is expected if you take this medicine for a long time. Do not suddenly stop taking your medicine because you may develop a severe reaction. Your body becomes used to the medicine. This does NOT mean you are addicted. Addiction is a behavior related to getting and using a drug for a  non-medical reason. If you have pain, you have a medical reason to take pain medicine. Your doctor will tell you how much medicine to take. If your doctor wants you to stop the medicine, the dose will be slowly lowered over time to avoid any side effects. You may get drowsy or dizzy. Do not drive, use machinery, or do anything that needs mental  alertness until you know how this medicine affects you. Do not stand or sit up quickly, especially if you are an older patient. This reduces the risk of dizzy or fainting spells. Alcohol may interfere with the effect of this medicine. Avoid alcoholic drinks. There are different types of narcotic medicines (opiates) for pain. If you take more than one type at the same time, you may have more side effects. Give your health care provider a list of all medicines you use. Your doctor will tell you how much medicine to take. Do not take more medicine than directed. Call emergency for help if you have problems breathing. The medicine will cause constipation. Try to have a bowel movement at least every 2 to 3 days. If you do not have a bowel movement for 3 days, call your doctor or health care professional. Do not take Tylenol (acetaminophen) or medicines that have acetaminophen with this medicine. Too much acetaminophen can be very dangerous. Many nonprescription medicines contain acetaminophen. Always read the labels carefully to avoid taking more acetaminophen. What side effects may I notice from receiving this medicine? Side effects that you should report to your doctor or health care professional as soon as possible: -allergic reactions like skin rash, itching or hives, swelling of the face, lips, or tongue -breathing difficulties, wheezing -confusion -light headedness or fainting spells -severe stomach pain -unusually weak or tired -yellowing of the skin or the whites of the eyes Side effects that usually do not require medical attention (report to your doctor or health care professional if they continue or are bothersome): -dizziness -drowsiness -nausea -vomiting This list may not describe all possible side effects. Call your doctor for medical advice about side effects. You may report side effects to FDA at 1-800-FDA-1088. Where should I keep my medicine? Keep out of the reach of children. This  medicine can be abused. Keep your medicine in a safe place to protect it from theft. Do not share this medicine with anyone. Selling or giving away this medicine is dangerous and against the law. This medicine may cause accidental overdose and death if it taken by other adults, children, or pets. Mix any unused medicine with a substance like cat litter or coffee grounds. Then throw the medicine away in a sealed container like a sealed bag or a coffee can with a lid. Do not use the medicine after the expiration date. Store at room temperature between 20 and 25 degrees C (68 and 77 degrees F). NOTE: This sheet is a summary. It may not cover all possible information. If you have questions about this medicine, talk to your doctor, pharmacist, or health care provider.    2016, Elsevier/Gold Standard. (2014-07-18 15:18:46)

## 2016-03-07 NOTE — ED Provider Notes (Signed)
CSN: [id number]     Arrival date & time 03/07/16  0021 History   First MD Initiated Contact with Patient 03/07/16 0122     Chief Complaint  Patient presents with  . Foot Pain     (Consider location/radiation/quality/duration/timing/severity/associated sxs/prior Treatment) Patient is a 35 y.o. female presenting with lower extremity pain. The history is provided by the patient.  Foot Pain  She woke up 2 days ago with pain in her left heel which radiated to the left midfoot. Pain is severe and she rates at 10/10. Is worse with trying to bear weight but nothing makes it better. She denies any trauma or unusual level of activity. [facility] [place] and was told she had a stress fracture and was prescribed ibuprofen and Lortab 5 mg. She is taking these with no relief.  Past Medical History  Diagnosis Date  . Asthma   . DVT (deep venous thrombosis) (HCC)   . Pulmonary emboli (HCC)   . Diabetes mellitus without complication (HCC)   . Thyroid disease   . Sleep apnea    Past Surgical History  Procedure Laterality Date  . Ivc filter placement ([facility] hx)     History reviewed. No pertinent family history. Social History  Substance Use Topics  . Smoking status: Current Every Day Smoker -- 1.50 packs/day    Types: Cigarettes  . Smokeless tobacco: None  . Alcohol Use: No   OB History    No data available     Review of Systems  All other systems reviewed and are negative.     Allergies  Review of patient's allergies indicates no known allergies.  Home Medications   Prior to Admission medications   Medication Sig Start Date End Date Taking? Authorizing Provider  acetaminophen (TYLENOL) 500 MG tablet Take 1,000 mg by mouth every 6 (six) hours as needed for mild pain.    Historical Provider, MD  apixaban (ELIQUIS) 5 MG TABS tablet Take 2 tablets (10 mg total) by mouth 2 (two) times daily. 01/06/16   Henderson CloudEstela Y Hernandez Acosta, MD  apixaban  (ELIQUIS) 5 MG TABS tablet Take 1 tablet (5 mg total) by mouth 2 (two) times daily. 01/13/16   Henderson CloudEstela Y Hernandez Acosta, MD  VENTOLIN HFA 108 [phone](90 Base) MCG/ACT inhaler Inhale 2 puffs into the lungs every 4 (four) hours. 10/29/15   Historical Provider, MD   BP 112/82 mmHg  Pulse 95  Temp(Src) 98 F (36.7 C) (Oral)  Resp 20  Ht 5\' 5"  (1.651 m)  Wt 285 lb (129.275 kg)  BMI 47.43 kg/m2  SpO2 96%  LMP 02/08/2016 Physical Exam  Nursing note and vitals reviewed.  35 year old female, resting comfortably and in no acute distress. Vital signs are normal96. Oxygen saturation is 96%, which is normal. Head is normocephalic and atraumatic. PERRLA, EOMI. Oropharynx is clear. Neck is nontender and supple without adenopathy or JVD. Back is nontender and there is no CVA tenderness. Lungs are clear without rales, wheezes, or rhonchi. Chest is nontender. Heart has regular rate and rhythm without murmur. Abdomen is soft, flat, nontender without masses or hepatosplenomegaly and peristalsis is normoactive. Extremities have 1+ edema, full range of motion is present. There is mild tenderness to palpation over the left heel and there is some pain when tension is applied to the plantar fascia. Skin is warm and dry without rash. Neurologic: Mental status is normal, cranial nerves are intact, there are no motor or sensory deficits.  ED Course  Procedures (including critical care time)  Imaging Review Dg Foot Complete Left  03/07/2016  CLINICAL DATA:  LEFT hindfoot pain, no injury. History of deep vein thrombosis and diabetes. EXAM: LEFT FOOT - COMPLETE 3+ VIEW COMPARISON:  None. FINDINGS: There is no evidence of fracture or dislocation. Small plantar calcaneal spur. There is no evidence of arthropathy or other focal bone abnormality. Generalized soft tissue swelling without subcutaneous gas or radiopaque foreign bodies. IMPRESSION: Soft tissue swelling without acute osseous process. Electronically Signed   By:  Awilda Metro M.D.   On: 03/07/2016 02:16   I have personally reviewed and evaluated these images and lab results as part of my medical decision-making.   MDM   Final diagnoses:  Right foot pain  Anticoagulant long-term use    Left foot pain suspicious for plantar fasciitis. Unfortunately, I cannot view x-rays from the hospital she went to yesterday. X-rays were obtained of the left foot. She'll be given 2 tablets of oxycodone-acetaminophen.Old records are reviewed and she was hospitalized with pulmonary embolus in 2 months ago and is currently anticoagulated on apixaban, so NSAIDs are contraindicated.  X-rays are unremarkable. She had good relief of pain with higher dose of oxycodone-acetaminophen. She is advised to stop taking ibuprofen and is given a prescription fo oxycodone-acetaminophen 7.5-325. Referred to orthopedics for follow-up. Recommended ice. Given crutches to use as needed.  Dione Booze, MD 03/07/16 [phone]

## 2016-03-07 NOTE — ED Notes (Signed)
Pt reports waking up on Thursday with pain to her left heel/foot. Pt reports difficulty walking. Pt also reports [facility] and was given ibuprofen and Lortab and the pt states these meds are not helping.

## 2016-03-07 NOTE — ED Notes (Signed)
Pt reports [facility] and told has a "stress fracture", was given a brace. Pt not wearing brace, states she doesn't know how to use it, unaware of where the fracture is.

## 2016-03-09 ENCOUNTER — Emergency Department (HOSPITAL_COMMUNITY)
Admission: EM | Admit: 2016-03-09 | Discharge: 2016-03-10 | Disposition: A | Discharge status: Home / Self Care | Payer: Medicaid - Out of State | Attending: Dermatology | Admitting: Dermatology

## 2016-03-09 ENCOUNTER — Encounter (HOSPITAL_COMMUNITY): Payer: Self-pay | Admitting: Emergency Medicine

## 2016-03-09 DIAGNOSIS — F17210 Nicotine dependence, cigarettes, uncomplicated: Secondary | ICD-10-CM | POA: Insufficient documentation

## 2016-03-09 DIAGNOSIS — Z79899 Other long term (current) drug therapy: Secondary | ICD-10-CM | POA: Diagnosis not present

## 2016-03-09 DIAGNOSIS — R2242 Localized swelling, mass and lump, left lower limb: Secondary | ICD-10-CM | POA: Insufficient documentation

## 2016-03-09 DIAGNOSIS — Z5321 Procedure and treatment not carried out due to patient leaving prior to being seen by health care provider: Secondary | ICD-10-CM | POA: Insufficient documentation

## 2016-03-09 DIAGNOSIS — J45909 Unspecified asthma, uncomplicated: Secondary | ICD-10-CM | POA: Diagnosis not present

## 2016-03-09 DIAGNOSIS — E119 Type 2 diabetes mellitus without complications: Secondary | ICD-10-CM | POA: Diagnosis not present

## 2016-03-09 NOTE — ED Notes (Signed)
Pt with L. Foot swelling and pain where she states she "cannot even walk". Pt seen here Saturday for same and released.

## 2016-03-10 NOTE — ED Notes (Signed)
Per registration pt left facility. 

## 2016-03-14 ENCOUNTER — Emergency Department (HOSPITAL_COMMUNITY): Payer: Medicaid - Out of State

## 2016-03-14 ENCOUNTER — Encounter (HOSPITAL_COMMUNITY): Payer: Self-pay

## 2016-03-14 ENCOUNTER — Emergency Department (HOSPITAL_COMMUNITY)
Admission: EM | Admit: 2016-03-14 | Discharge: 2016-03-14 | Disposition: A | Discharge status: Home / Self Care | Payer: Medicaid - Out of State | Attending: Emergency Medicine | Admitting: Emergency Medicine

## 2016-03-14 ENCOUNTER — Other Ambulatory Visit (HOSPITAL_COMMUNITY): Payer: Self-pay | Admitting: Emergency Medicine

## 2016-03-14 ENCOUNTER — Telehealth: Payer: Self-pay | Admitting: *Deleted

## 2016-03-14 ENCOUNTER — Ambulatory Visit (HOSPITAL_COMMUNITY)
Admission: RE | Admit: 2016-03-14 | Discharge: 2016-03-14 | Disposition: A | Discharge status: Home / Self Care | Payer: Medicaid - Out of State | Source: Ambulatory Visit | Attending: Emergency Medicine | Admitting: Emergency Medicine

## 2016-03-14 DIAGNOSIS — Z791 Long term (current) use of non-steroidal anti-inflammatories (NSAID): Secondary | ICD-10-CM | POA: Diagnosis not present

## 2016-03-14 DIAGNOSIS — M79605 Pain in left leg: Secondary | ICD-10-CM | POA: Diagnosis present

## 2016-03-14 DIAGNOSIS — E119 Type 2 diabetes mellitus without complications: Secondary | ICD-10-CM | POA: Diagnosis not present

## 2016-03-14 DIAGNOSIS — I82442 Acute embolism and thrombosis of left tibial vein: Secondary | ICD-10-CM | POA: Insufficient documentation

## 2016-03-14 DIAGNOSIS — I82432 Acute embolism and thrombosis of left popliteal vein: Secondary | ICD-10-CM | POA: Insufficient documentation

## 2016-03-14 DIAGNOSIS — I808 Phlebitis and thrombophlebitis of other sites: Secondary | ICD-10-CM | POA: Insufficient documentation

## 2016-03-14 DIAGNOSIS — Z792 Long term (current) use of antibiotics: Secondary | ICD-10-CM | POA: Diagnosis not present

## 2016-03-14 DIAGNOSIS — F17210 Nicotine dependence, cigarettes, uncomplicated: Secondary | ICD-10-CM | POA: Diagnosis not present

## 2016-03-14 DIAGNOSIS — Z79899 Other long term (current) drug therapy: Secondary | ICD-10-CM | POA: Diagnosis not present

## 2016-03-14 DIAGNOSIS — J45909 Unspecified asthma, uncomplicated: Secondary | ICD-10-CM | POA: Insufficient documentation

## 2016-03-14 DIAGNOSIS — M7989 Other specified soft tissue disorders: Secondary | ICD-10-CM | POA: Diagnosis not present

## 2016-03-14 DIAGNOSIS — M79672 Pain in left foot: Secondary | ICD-10-CM | POA: Diagnosis present

## 2016-03-14 MED ORDER — OXYCODONE-ACETAMINOPHEN 5-325 MG PO TABS
2.0000 | ORAL_TABLET | Freq: Once | ORAL | Status: AC
  Administered 2016-03-14: 2 via ORAL
  Filled 2016-03-14: qty 2

## 2016-03-14 MED ORDER — KETOROLAC TROMETHAMINE 60 MG/2ML IM SOLN
60.0000 mg | Freq: Once | INTRAMUSCULAR | Status: AC
  Administered 2016-03-14: 60 mg via INTRAMUSCULAR
  Filled 2016-03-14: qty 2

## 2016-03-14 MED ORDER — IBUPROFEN 800 MG PO TABS: 800.0000 mg | ORAL_TABLET | Freq: Three times a day (TID) | ORAL | Status: DC

## 2016-03-14 MED ORDER — ENOXAPARIN SODIUM 150 MG/ML ~~LOC~~ SOLN
1.0000 mg/kg | Freq: Once | SUBCUTANEOUS | Status: AC
  Administered 2016-03-14: 130 mg via SUBCUTANEOUS
  Filled 2016-03-14: qty 1

## 2016-03-14 NOTE — ED Provider Notes (Addendum)
Returns for US LLE; results as below. Pt has hx IVC filter. Pt also endorses non-compliance with both eliquis and coumadin "for a while." Believes the LD of either was "a week or more ago."  Pt wishes to re-start on eliquis. Will rx. Pt strongly encouraged to take her meds as prescribed and f/u with her PMD for good continuity of care and control of her chronic medical conditions. Dx and testing d/w pt and family.  Questions answered.  Verb understanding, agreeable to d/c home with outpt f/u.   [place] Venous Img Lower Unilateral Left 03/14/2016  CLINICAL DATA:  35 year old female with a history of left leg pain EXAM: LEFT LOWER EXTREMITY VENOUS DOPPLER ULTRASOUND TECHNIQUE: Gray-scale sonography with graded compression, as well as color Doppler and duplex ultrasound were performed to evaluate the lower extremity deep venous systems from the level of the common femoral vein and including the common femoral, femoral, profunda femoral, popliteal and calf veins including the posterior tibial, peroneal and gastrocnemius veins when visible. The superficial great saphenous vein was also interrogated. Spectral Doppler was utilized to evaluate flow at rest and with distal augmentation maneuvers in the common femoral, femoral and popliteal veins. COMPARISON:  None. FINDINGS: Contralateral Common Femoral Vein: Respiratory phasicity is normal and symmetric with the symptomatic side. No evidence of thrombus. Normal compressibility. Common Femoral Vein: No evidence of thrombus. Normal compressibility, respiratory phasicity and response to augmentation. Saphenofemoral Junction: No evidence of thrombus. Normal compressibility and flow on color Doppler imaging. Profunda Femoral Vein: No evidence of thrombus. Normal compressibility and flow on color Doppler imaging. Femoral Vein: No evidence of thrombus. Normal compressibility, respiratory phasicity and response to augmentation. Popliteal Vein: Occlusive thrombus of the right  popliteal vein extending distally into the posterior tibial vein. Calf Veins: No evidence of thrombus. Normal compressibility and flow on color Doppler imaging. Superficial Great Saphenous Vein: Superficial thrombophlebitis of the distal great saphenous vein at the ankle Other Findings:  None. IMPRESSION: Sonographic survey of the left lower extremity is positive for DVT of the left popliteal vein, which extends distally to the posterior tibial vein. Superficial thrombophlebitis involving the great saphenous vein at the ankle These results will be called to the ordering clinician or representative by the Radiologist Assistant, and communication documented in the PACS or zVision Dashboard. Signed, Yvone NeuJaime S. Loreta AveWagner, DO [facility] Electronically Signed   By: Gilmer MorJaime  Wagner D.O.   On: 03/14/2016 12:09      Samuel JesterKathleen Bentli Llorente, DO 03/14/16 1245

## 2016-03-14 NOTE — ED Provider Notes (Signed)
CSN: [id number]     Arrival date & time 03/14/16  0139 History   First MD Initiated Contact with Patient 03/14/16 0247     Chief Complaint  Patient presents with  . Foot Pain     (Consider location/radiation/quality/duration/timing/severity/associated sxs/prior Treatment) Patient is a 35 y.o. female presenting with lower extremity pain.  Foot Pain The current episode started more than 1 week ago. The problem occurs constantly. Pertinent negatives include no chest pain and no headaches. Nothing aggravates the symptoms. Nothing relieves the symptoms. She has tried nothing for the symptoms.    Past Medical History  Diagnosis Date  . Asthma   . DVT (deep venous thrombosis) (HCC)   . Pulmonary emboli (HCC)   . Diabetes mellitus without complication (HCC)   . Thyroid disease   . Sleep apnea    Past Surgical History  Procedure Laterality Date  . Ivc filter placement ([facility] hx)     No family history on file. Social History  Substance Use Topics  . Smoking status: Current Every Day Smoker -- 1.50 packs/day    Types: Cigarettes  . Smokeless tobacco: None  . Alcohol Use: No   OB History    No data available     Review of Systems  Cardiovascular: Negative for chest pain.  Musculoskeletal: Negative for back pain.       Left foot pain and swelling  Neurological: Negative for headaches.  All other systems reviewed and are negative.     Allergies  Review of patient's allergies indicates no known allergies.  Home Medications   Prior to Admission medications   Medication Sig Start Date End Date Taking? Authorizing Provider  acetaminophen (TYLENOL) 500 MG tablet Take 1,000 mg by mouth every 6 (six) hours as needed for mild pain.   Yes Historical Provider, MD  VENTOLIN HFA 108 (90 Base) MCG/ACT inhaler Inhale 2 puffs into the lungs every 4 (four) hours. 10/29/15  Yes Historical Provider, MD  warfarin (COUMADIN) 10 MG tablet Take 10 mg by mouth daily.   Yes Historical Provider, MD   apixaban (ELIQUIS) 5 MG TABS tablet Take 2 tablets (10 mg total) by mouth 2 (two) times daily. 01/06/16   Henderson CloudEstela Y Hernandez Acosta, MD  apixaban (ELIQUIS) 5 MG TABS tablet Take 1 tablet (5 mg total) by mouth 2 (two) times daily. 01/13/16   Henderson CloudEstela Y Hernandez Acosta, MD  ibuprofen (ADVIL,MOTRIN) 800 MG tablet Take 1 tablet (800 mg total) by mouth 3 (three) times daily. 03/14/16   Marily MemosJason Abimael Zeiter, MD  oxyCODONE-acetaminophen (PERCOCET) 7.5-325 MG tablet Take 1 tablet by mouth every 4 (four) hours as needed for severe pain. 03/07/16   Dione Boozeavid Glick, MD   BP 95/84 mmHg  Pulse 94  Temp(Src) 97.9 F (36.6 C) (Oral)  Resp 18  Ht 5\' 5"  (1.651 m)  Wt 285 lb (129.275 kg)  BMI 47.43 kg/m2  SpO2 97%  LMP 03/13/2016 Physical Exam  Constitutional: She is oriented to person, place, and time. She appears well-developed and well-nourished.  HENT:  Head: Normocephalic and atraumatic.  Neck: Normal range of motion.  Cardiovascular: Normal rate and regular rhythm.   Pulmonary/Chest: No stridor. No respiratory distress.  Abdominal: She exhibits no distension.  Musculoskeletal: She exhibits edema (left foot). She exhibits no tenderness.  Neurological: She is alert and oriented to person, place, and time.  Skin: Skin is warm and dry.  Nursing note and vitals reviewed.   ED Course  Procedures (including critical care time) Labs Review Labs Reviewed -  No data to display  Imaging Review Dg Foot Complete Left  03/14/2016  CLINICAL DATA:  Left foot pain and swelling for 2 days. No injury. History of diabetes and DVT EXAM: LEFT FOOT - COMPLETE 3+ VIEW COMPARISON:  03/07/2016 FINDINGS: Dorsal soft tissue swelling over the left foot similar to prior study. No radiopaque soft tissue foreign bodies or soft tissue gas collections. No focal bone lesion or bone sclerosis. No bone erosion. No evidence of osteomyelitis. No evidence of acute fracture or subluxation. Small plantar calcaneal spur. IMPRESSION: Soft tissue  swelling over the dorsum of the left foot. No acute bony abnormalities. No evidence of osteomyelitis Electronically Signed   By: Burman Nieves M.D.   On: 03/14/2016 03:44   I have personally reviewed and evaluated these images and lab results as part of my medical decision-making.   EKG Interpretation None      MDM   Final diagnoses:  Foot swelling    Chronic foot pain. No e/o healing fx on xr, doubt stress fx (had been told this in the past).  No redness, induration or other e/o cellulitis. H/o dvt, so will check [place] tomorrow AM.   If negative will need pcp follow up for possible MRI to r/o ligamentous injury.   lovenox given.   [place] scheduled.   New Prescriptions: Discharge Medication List as of 03/14/2016  3:49 AM    START taking these medications   Details  ibuprofen (ADVIL,MOTRIN) 800 MG tablet Take 1 tablet (800 mg total) by mouth 3 (three) times daily., Starting 03/14/2016, Until Discontinued, Print         I have personally and contemperaneously reviewed labs and imaging and used in my decision making as above.   A medical screening exam was performed and I feel the patient has had an appropriate workup for their chief complaint at this time and likelihood of emergent condition existing is low and thus workup can continue on an outpatient basis.. Their vital signs are stable. They have been counseled on decision, discharge, follow up and which symptoms necessitate immediate return to the emergency department.  They verbally stated understanding and agreement with plan and discharged in stable condition.      Marily Memos, MD 03/14/16 [phone]

## 2016-03-14 NOTE — ED Notes (Signed)
Pt back from xray, assisted to bathroom by family member

## 2016-03-14 NOTE — ED Notes (Signed)
Pt reports pain to left foot x 1 week, denies injury

## 2016-03-23 ENCOUNTER — Inpatient Hospital Stay (HOSPITAL_COMMUNITY)
Admission: EM | Admit: 2016-03-23 | Discharge: 2016-03-24 | DRG: 299 | Disposition: A | Discharge status: Home / Self Care | Payer: Medicaid - Out of State | Attending: Internal Medicine | Admitting: Internal Medicine

## 2016-03-23 ENCOUNTER — Emergency Department (HOSPITAL_COMMUNITY): Payer: Medicaid - Out of State

## 2016-03-23 ENCOUNTER — Encounter (HOSPITAL_COMMUNITY): Payer: Self-pay | Admitting: Emergency Medicine

## 2016-03-23 DIAGNOSIS — G4730 Sleep apnea, unspecified: Secondary | ICD-10-CM | POA: Diagnosis present

## 2016-03-23 DIAGNOSIS — F17210 Nicotine dependence, cigarettes, uncomplicated: Secondary | ICD-10-CM | POA: Diagnosis present

## 2016-03-23 DIAGNOSIS — E079 Disorder of thyroid, unspecified: Secondary | ICD-10-CM | POA: Diagnosis not present

## 2016-03-23 DIAGNOSIS — I2699 Other pulmonary embolism without acute cor pulmonale: Secondary | ICD-10-CM | POA: Diagnosis present

## 2016-03-23 DIAGNOSIS — Z86711 Personal history of pulmonary embolism: Secondary | ICD-10-CM | POA: Diagnosis not present

## 2016-03-23 DIAGNOSIS — E119 Type 2 diabetes mellitus without complications: Secondary | ICD-10-CM

## 2016-03-23 DIAGNOSIS — Z91120 Patient's intentional underdosing of medication regimen due to financial hardship: Secondary | ICD-10-CM

## 2016-03-23 DIAGNOSIS — Z6841 Body Mass Index (BMI) 40.0 and over, adult: Secondary | ICD-10-CM | POA: Diagnosis not present

## 2016-03-23 DIAGNOSIS — M79605 Pain in left leg: Secondary | ICD-10-CM | POA: Diagnosis not present

## 2016-03-23 DIAGNOSIS — Z86718 Personal history of other venous thrombosis and embolism: Secondary | ICD-10-CM | POA: Diagnosis not present

## 2016-03-23 DIAGNOSIS — E039 Hypothyroidism, unspecified: Secondary | ICD-10-CM | POA: Diagnosis present

## 2016-03-23 DIAGNOSIS — I82402 Acute embolism and thrombosis of unspecified deep veins of left lower extremity: Secondary | ICD-10-CM | POA: Diagnosis present

## 2016-03-23 DIAGNOSIS — J45909 Unspecified asthma, uncomplicated: Secondary | ICD-10-CM | POA: Diagnosis present

## 2016-03-23 DIAGNOSIS — I8002 Phlebitis and thrombophlebitis of superficial vessels of left lower extremity: Secondary | ICD-10-CM

## 2016-03-23 DIAGNOSIS — E0800 Diabetes mellitus due to underlying condition with hyperosmolarity without nonketotic hyperglycemic-hyperosmolar coma (NKHHC): Secondary | ICD-10-CM | POA: Diagnosis not present

## 2016-03-23 DIAGNOSIS — Z9114 Patient's other noncompliance with medication regimen: Secondary | ICD-10-CM | POA: Diagnosis not present

## 2016-03-23 LAB — CBC WITH DIFFERENTIAL/PLATELET
Basophils Absolute: 0 10*3/uL (ref 0.0–0.1)
Basophils Relative: 0 %
Eosinophils Absolute: 0.2 10*3/uL (ref 0.0–0.7)
Eosinophils Relative: 3 %
HCT: 34.6 % — ABNORMAL LOW (ref 36.0–46.0)
Hemoglobin: 11.1 g/dL — ABNORMAL LOW (ref 12.0–15.0)
Lymphocytes Relative: 36 %
Lymphs Abs: 2.2 10*3/uL (ref 0.7–4.0)
MCH: 25 pg — ABNORMAL LOW (ref 26.0–34.0)
MCHC: 32.1 g/dL (ref 30.0–36.0)
MCV: 77.9 fL — ABNORMAL LOW (ref 78.0–100.0)
Monocytes Absolute: 0.4 10*3/uL (ref 0.1–1.0)
Monocytes Relative: 7 %
Neutro Abs: 3.3 10*3/uL (ref 1.7–7.7)
Neutrophils Relative %: 54 %
Platelets: 374 10*3/uL (ref 150–400)
RBC: 4.44 MIL/uL (ref 3.87–5.11)
RDW: 16.1 % — ABNORMAL HIGH (ref 11.5–15.5)
WBC: 6.1 10*3/uL (ref 4.0–10.5)

## 2016-03-23 LAB — HEPARIN LEVEL (UNFRACTIONATED)
Heparin Unfractionated: <0.1 IU/mL — ABNORMAL LOW (ref 0.30–0.70)
Heparin Unfractionated: 0.47 IU/mL (ref 0.30–0.70)

## 2016-03-23 LAB — BASIC METABOLIC PANEL
Anion gap: 6 (ref 5–15)
BUN: 13 mg/dL (ref 6–20)
CO2: 26 mmol/L (ref 22–32)
Calcium: 8.7 mg/dL — ABNORMAL LOW (ref 8.9–10.3)
Chloride: 108 mmol/L (ref 101–111)
Creatinine, Ser: 0.83 mg/dL (ref 0.44–1.00)
GFR calc Af Amer: >60 mL/min (ref >60)
GFR calc non Af Amer: >60 mL/min (ref >60)
Glucose, Bld: 116 mg/dL — ABNORMAL HIGH (ref 65–99)
Potassium: 3.8 mmol/L (ref 3.5–5.1)
Sodium: 140 mmol/L (ref 135–145)

## 2016-03-23 LAB — TROPONIN I: Troponin I: <0.03 ng/mL (ref <0.03)

## 2016-03-23 LAB — PROTIME-INR
INR: 1.14 (ref 0.00–1.49)
Prothrombin Time: 14.8 seconds (ref 11.6–15.2)

## 2016-03-23 LAB — APTT: aPTT: 30 s (ref 24–37)

## 2016-03-23 LAB — GLUCOSE, CAPILLARY
Glucose-Capillary: 112 mg/dL — ABNORMAL HIGH (ref 65–99)
Glucose-Capillary: 101 mg/dL — ABNORMAL HIGH (ref 65–99)

## 2016-03-23 LAB — CBG MONITORING, ED: Glucose-Capillary: 119 mg/dL — ABNORMAL HIGH (ref 65–99)

## 2016-03-23 LAB — TSH: TSH: 1.648 u[IU]/mL (ref 0.350–4.500)

## 2016-03-23 LAB — HCG, QUANTITATIVE, PREGNANCY: hCG, Beta Chain, Quant, S: 1 m[IU]/mL (ref <5)

## 2016-03-23 MED ORDER — ONDANSETRON HCL 4 MG/2ML IJ SOLN
4.0000 mg | Freq: Once | INTRAMUSCULAR | Status: AC
  Administered 2016-03-23: 4 mg via INTRAVENOUS
  Filled 2016-03-23: qty 2

## 2016-03-23 MED ORDER — HEPARIN (PORCINE) IN NACL 100-0.45 UNIT/ML-% IJ SOLN: 1400.0000 [IU]/h | INTRAMUSCULAR | Status: DC

## 2016-03-23 MED ORDER — ENOXAPARIN SODIUM 120 MG/0.8ML ~~LOC~~ SOLN: 120.0000 mg | Freq: Once | SUBCUTANEOUS | Status: DC

## 2016-03-23 MED ORDER — HYDROMORPHONE HCL 1 MG/ML IJ SOLN
1.0000 mg | Freq: Once | INTRAMUSCULAR | Status: AC
  Administered 2016-03-23: 1 mg via INTRAVENOUS
  Filled 2016-03-23: qty 1

## 2016-03-23 MED ORDER — SODIUM CHLORIDE 0.9 % IV BOLUS (SEPSIS)
1000.0000 mL | Freq: Once | INTRAVENOUS | Status: AC
  Administered 2016-03-23: 1000 mL via INTRAVENOUS

## 2016-03-23 MED ORDER — HYDROCODONE-ACETAMINOPHEN 5-325 MG PO TABS
1.5000 | ORAL_TABLET | Freq: Four times a day (QID) | ORAL | Status: DC | PRN
  Administered 2016-03-23 – 2016-03-24 (×4): 1.5 via ORAL
  Filled 2016-03-23 (×4): qty 2

## 2016-03-23 MED ORDER — INSULIN ASPART 100 UNIT/ML ~~LOC~~ SOLN: 0.0000 [IU] | Freq: Every day | SUBCUTANEOUS | Status: DC

## 2016-03-23 MED ORDER — HYDROMORPHONE HCL 1 MG/ML IJ SOLN: 1.0000 mg | INTRAMUSCULAR | Status: DC | PRN

## 2016-03-23 MED ORDER — HEPARIN (PORCINE) IN NACL 100-0.45 UNIT/ML-% IJ SOLN
1400.0000 [IU]/h | INTRAMUSCULAR | Status: DC
  Administered 2016-03-23: 1400 [IU]/h via INTRAVENOUS
  Filled 2016-03-23 (×2): qty 250

## 2016-03-23 MED ORDER — OXYCODONE-ACETAMINOPHEN 7.5-325 MG PO TABS: 1.0000 | ORAL_TABLET | ORAL | Status: DC | PRN

## 2016-03-23 MED ORDER — HEPARIN BOLUS VIA INFUSION: 5000.0000 [IU] | Freq: Once | INTRAVENOUS | Status: DC

## 2016-03-23 MED ORDER — DIPHENHYDRAMINE HCL 25 MG PO CAPS
25.0000 mg | ORAL_CAPSULE | Freq: Four times a day (QID) | ORAL | Status: DC | PRN
  Administered 2016-03-23 – 2016-03-24 (×2): 25 mg via ORAL
  Filled 2016-03-23 (×2): qty 1

## 2016-03-23 MED ORDER — ONDANSETRON HCL 4 MG/2ML IJ SOLN: 4.0000 mg | Freq: Four times a day (QID) | INTRAMUSCULAR | Status: DC | PRN

## 2016-03-23 MED ORDER — ALBUTEROL SULFATE (2.5 MG/3ML) 0.083% IN NEBU
2.5000 mg | INHALATION_SOLUTION | RESPIRATORY_TRACT | Status: DC
  Administered 2016-03-23 (×3): 2.5 mg via RESPIRATORY_TRACT
  Filled 2016-03-23 (×4): qty 3

## 2016-03-23 MED ORDER — ALBUTEROL SULFATE HFA 108 (90 BASE) MCG/ACT IN AERS: 2.0000 | INHALATION_SPRAY | RESPIRATORY_TRACT | Status: DC

## 2016-03-23 MED ORDER — SODIUM CHLORIDE 0.9 % IV SOLN
INTRAVENOUS | Status: AC
  Administered 2016-03-23: 16:00:00 via INTRAVENOUS

## 2016-03-23 MED ORDER — ACETAMINOPHEN 325 MG PO TABS: 650.0000 mg | ORAL_TABLET | Freq: Four times a day (QID) | ORAL | Status: DC | PRN

## 2016-03-23 MED ORDER — INSULIN ASPART 100 UNIT/ML ~~LOC~~ SOLN
0.0000 [IU] | Freq: Three times a day (TID) | SUBCUTANEOUS | Status: DC
  Administered 2016-03-24: 2 [IU] via SUBCUTANEOUS

## 2016-03-23 MED ORDER — MORPHINE SULFATE (PF) 4 MG/ML IV SOLN
4.0000 mg | Freq: Once | INTRAVENOUS | Status: AC
  Administered 2016-03-23: 4 mg via INTRAVENOUS
  Filled 2016-03-23: qty 1

## 2016-03-23 MED ORDER — ALBUTEROL SULFATE (2.5 MG/3ML) 0.083% IN NEBU
2.5000 mg | INHALATION_SOLUTION | Freq: Three times a day (TID) | RESPIRATORY_TRACT | Status: DC
  Administered 2016-03-24: 2.5 mg via RESPIRATORY_TRACT
  Filled 2016-03-23: qty 3

## 2016-03-23 MED ORDER — HEPARIN BOLUS VIA INFUSION
4000.0000 [IU] | Freq: Once | INTRAVENOUS | Status: AC
  Administered 2016-03-23: 4000 [IU] via INTRAVENOUS

## 2016-03-23 MED ORDER — IOPAMIDOL (ISOVUE-370) INJECTION 76%
100.0000 mL | Freq: Once | INTRAVENOUS | Status: AC | PRN
  Administered 2016-03-23: 100 mL via INTRAVENOUS

## 2016-03-23 NOTE — ED Provider Notes (Signed)
TIME SEEN: 3:00 AM  CHIEF COMPLAINT: Right-sided chest pain, shortness of breath, left leg pain  HPI: Pt is a 35 y.o. female with history of asthma, diabetes, PE, DVT, medical noncompliance who presents to the emergency department with complaints of left leg pain. Was seen in the emergency department on July 8 and had a negative x-ray of her foot. Was given 20 Percocet at that time. Came back on July 15 with pain in her calf and had a venous Doppler which showed a left lower extremity DVT from the left popliteal vein into the posterior tibial vein. She also had superficial thrombophlebitis of the greater saphenous vein at the ankle. She had repeat x-rays which were negative. No sign of cellulitis during either visit. Coming back in today saying that she is still having pain and she is out of pain medication. Has tried ibuprofen and Tylenol home without relief. Denies any fever. No history of injury. When asked if she is having pain anywhere else, patient mentions that she has had intermittent right-sided sharp chest pain and shortness of breath. She does have an IVC filter. She reports that she was prescribed Eliquis for her DVT initially and she took this medication per her report and reports compliance but states now her insurance will not cover this medication so her cardiologist put her back on Coumadin. She is scheduled to have her INR checked in 2 weeks. States she is not sure why she continues to have PE, DVTs.  PCP - none Cardiologist in [place]   ROS: See HPI Constitutional: no fever  Eyes: no drainage  ENT: no runny nose   Cardiovascular:   chest pain  Resp:  SOB  GI: no vomiting GU: no dysuria Integumentary: no rash  Allergy: no hives  Musculoskeletal: Left leg swelling  Neurological: no slurred speech ROS otherwise negative  PAST MEDICAL HISTORY/PAST SURGICAL HISTORY:  Past Medical History:  Diagnosis Date  . Asthma   . Diabetes mellitus without complication (HCC)   . DVT  (deep venous thrombosis) (HCC)   . Pulmonary emboli (HCC)   . Sleep apnea   . Thyroid disease     MEDICATIONS:  Prior to Admission medications   Medication Sig Start Date End Date Taking? Authorizing Provider  acetaminophen (TYLENOL) 500 MG tablet Take 1,000 mg by mouth every 6 (six) hours as needed for mild pain.    Historical Provider, MD  apixaban (ELIQUIS) 5 MG TABS tablet Take 2 tablets (10 mg total) by mouth 2 (two) times daily. 01/06/16   Henderson Cloud, MD  apixaban (ELIQUIS) 5 MG TABS tablet Take 1 tablet (5 mg total) by mouth 2 (two) times daily. 01/13/16   Henderson Cloud, MD  ibuprofen (ADVIL,MOTRIN) 800 MG tablet Take 1 tablet (800 mg total) by mouth 3 (three) times daily. 03/14/16   Marily Memos, MD  oxyCODONE-acetaminophen (PERCOCET) 7.5-325 MG tablet Take 1 tablet by mouth every 4 (four) hours as needed for severe pain. 03/07/16   Dione Booze, MD  VENTOLIN HFA 108 (90 Base) MCG/ACT inhaler Inhale 2 puffs into the lungs every 4 (four) hours. 10/29/15   Historical Provider, MD  warfarin (COUMADIN) 10 MG tablet Take 10 mg by mouth daily.    Historical Provider, MD    ALLERGIES:  No Known Allergies  SOCIAL HISTORY:  Social History  Substance Use Topics  . Smoking status: Current Every Day Smoker    Packs/day: 1.50    Types: Cigarettes  . Smokeless tobacco: Not on  file  . Alcohol use No    FAMILY HISTORY: No family history on file.  EXAM: BP 105/88 (BP Location: Left Arm)   Pulse 101   Temp 97.6 F (36.4 C) (Oral)   Resp 18   Ht  (1.651 m)   Wt 270 lb (122.5 kg)   LMP 03/13/2016   SpO2 97%   BMI 44.93 kg/m  CONSTITUTIONAL: Alert and oriented and responds appropriately to questions. Well-appearing; well-nourished, obese, in no distress HEAD: Normocephalic EYES: Conjunctivae clear, PERRL ENT: normal nose; no rhinorrhea; moist mucous membranes NECK: Supple, no meningismus, no LAD  CARD: RRR; S1 and S2 appreciated; no murmurs, no clicks, no  rubs, no gallops RESP: Normal chest excursion without splinting or tachypnea; breath sounds clear and equal bilaterally; no wheezes, no rhonchi, no rales, no hypoxia or respiratory distress, speaking full sentences ABD/GI: Normal bowel sounds; non-distended; soft, non-tender, no rebound, no guarding, no peritoneal signs BACK:  The back appears normal and is non-tender to palpation, there is no CVA tenderness EXT: Normal ROM in all joints; tender to palpation throughout the left posterior calf with associated swelling on the left side greater then right. She has 2+ DP pulses bilaterally. Her compartments are soft; no pitting edema; normal capillary refill; no cyanosis, no joint effusion, no erythema or warmth SKIN: Normal color for age and race; warm; no rash NEURO: Moves all extremities equally, sensation to light touch intact diffusely, cranial nerves II through XII intact PSYCH: The patient's mood and manner are appropriate. Grooming and personal hygiene are appropriate.  MEDICAL DECISION MAKING: Patient here with complaints of left lower shin pain. She has a known DVT in this leg. States she is on Coumadin. Does not have a bridge of Lovenox however. Does briefly mention intermittent right-sided chest pain and shortness of breath. We'll work her up for a pulmonary embolus given her medical noncompliance. 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 No fever, cough to suggest pneumonia. No history of CHF or sign of volume overload on exam. Low suspicion for ACS. EKG shows no ischemic abnormality.  ED PROGRESS: CT scan accidentally ordered as a dissection study. Discussed with radiologist who states he is able to see the patient does have bilateral pulmonary emboli. She reports that she has been compliant with her medications. I will start patient on heparin.  She denies that she has ever had a workup for hypercoagulability. I feel she will need to be admitted to the hospital for bilateral PE, DVT, pain  control. She denies that she has ever had a hypercoagulability workup.   5:30 AM  D/w Dr. Conley Rolls with hospitalist service. He will see the patient in consult and agrees with admission. And family updated with plan. Still complaining of pain after morphine. We'll give dose of Dilaudid. IV heparin has been ordered.     EKG Interpretation  Date/Time:  Monday March 23 2016 04:19:50 EDT Ventricular Rate:  91 PR Interval:    QRS Duration: 86 QT Interval:  354 QTC Calculation: 436 R Axis:   66 Text Interpretation:  Sinus rhythm No significant change since last tracing Confirmed by WARD,  DO, KRISTEN ([id number]) on 03/23/2016 4:24:11 AM        CRITICAL CARE Performed by: Raelyn Number   Total critical care time: 45 minutes  Critical care time was exclusive of separately billable procedures and treating other patients.  Critical care was necessary to treat or prevent imminent or life-threatening deterioration.  Critical care was  time spent personally by me on the following activities: development of treatment plan with patient and/or surrogate as well as nursing, discussions with consultants, evaluation of patient's response to treatment, examination of patient, obtaining history from patient or surrogate, ordering and performing treatments and interventions, ordering and review of laboratory studies, ordering and review of radiographic studies, pulse oximetry and re-evaluation of patient's condition.    Layla Maw Ward, DO 03/23/16 [phone]

## 2016-03-23 NOTE — ED Notes (Signed)
Call from pharmacists x 2 who request when pt last took elequis- pt last took her med on Sat (day before yesterday).

## 2016-03-23 NOTE — ED Notes (Signed)
Dr Conley Rolls in to see pt

## 2016-03-23 NOTE — ED Notes (Signed)
Pt reports to this RN that she informed the physician that she had chest pain yesterday and shortness of breath- she did not report this to this RN. IV established, meds for pain, specimens to lab and monitor/ekg  Pt is enroute to CT

## 2016-03-23 NOTE — ED Triage Notes (Signed)
Seen by several hospitals with DX of DVT here last Sunday tonight pain continues

## 2016-03-23 NOTE — ED Notes (Signed)
Pt continues in CT 

## 2016-03-23 NOTE — ED Notes (Signed)
Family at bedside. 

## 2016-03-23 NOTE — ED Notes (Signed)
Pt talking softly on the cell phone while baby eats crackers and SO at bedside

## 2016-03-23 NOTE — ED Notes (Signed)
Pt seen by her physician this week- states that she was ion eliquis and then told her mediciad did not cover it. (She is from [place]). She reports that her physician told her to take her warfarin instead. She appears upset that her DVT has not gone away in one week and when educated regarding the resolution of the time to decrease the DVT or resolve it she appears taken aback.  She has bilateral pedal pulses and ambulates with a slight limp speaking in complete sentences while walking

## 2016-03-23 NOTE — H&P (Addendum)
[facility] HistMarybel Alcottical  [place] Lobue AVW:[id number] DOB: 13-May-1981    PCP:   PROVIDER NOT IN SYSTEM   Chief Complaint: leg pain.   HPI: Donna Douglas is an 35 y.o. 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  [place] of her leg on March 14, 2016 showed DVT.  CTA done today in the ER showed evidence of bilateral PE.  She was started on IV heparin, and hospitalist was asked to admit her for further evaluation.   Rewiew of Systems:  Constitutional: Negative for malaise, fever and chills. No significant weight loss or weight gain Eyes: Negative for eye pain, redness and discharge, diplopia, visual changes, or flashes of light. ENMT: Negative for ear pain, hoarseness, nasal congestion, sinus pressure and sore throat. No headaches; tinnitus, drooling, or problem swallowing. Cardiovascular: Negative for chest pain, palpitations, diaphoresis, dyspnea and peripheral edema. ; No orthopnea, PND Respiratory: Negative for cough, hemoptysis, wheezing and stridor. No pleuritic chestpain. Gastrointestinal: Negative for nausea, vomiting, diarrhea, constipation, abdominal pain, melena, blood in stool, hematemesis, jaundice and rectal bleeding.    Genitourinary: Negative for frequency, dysuria, incontinence,flank pain and hematuria; Musculoskeletal: Negative for back pain and neck pain. Negative for swelling and trauma.; She tenderness bilaterally.  Skin: . Negative for pruritus, rash, abrasions, bruising and skin lesion.; ulcerations Neuro: Negative for headache, lightheadedness and neck stiffness. Negative for weakness, altered level of consciousness , altered mental status, extremity weakness, burning feet, involuntary movement, seizure and syncope.   Psych: negative for anxiety, depression, insomnia, tearfulness, panic attacks, hallucinations, paranoia, suicidal or homicidal ideation    Past Medical History:  Diagnosis Date  . Asthma   . Diabetes mellitus without complication (HCC)   . DVT (deep venous thrombosis) (HCC)   . Pulmonary emboli (HCC)   . Sleep apnea   . Thyroid disease     Past Surgical History:  Procedure Laterality Date  . IVC FILTER PLACEMENT ([facility] HX)      Medications:  HOME MEDS: Prior to Admission medications   Medication Sig Start Date End Date Taking? Authorizing Provider  acetaminophen (TYLENOL) 500 MG tablet Take 1,000 mg by mouth every 6 (six) hours as needed for mild pain.    Historical Provider, MD  apixaban (ELIQUIS) 5 MG TABS tablet Take 2 tablets (10 mg total) by mouth 2 (two) times daily. 01/06/16   Henderson Cloud, MD  apixaban (ELIQUIS) 5 MG TABS tablet Take 1 tablet (5 mg total) by mouth 2 (two) times daily. 01/13/16   Henderson Cloud, MD  ibuprofen (ADVIL,MOTRIN) 800 MG tablet Take 1 tablet (800 mg total) by mouth 3 (three) times daily. 03/14/16   Marily Memos, MD  oxyCODONE-acetaminophen (PERCOCET) 7.5-325 MG tablet Take 1 tablet by mouth every 4 (four) hours as needed for severe pain. 03/07/16   Dione Booze, MD  VENTOLIN HFA 108 (90 Base) MCG/ACT inhaler Inhale 2 puffs into the lungs every 4 (four) hours. 10/29/15   Historical Provider, MD  warfarin (COUMADIN) 10 MG tablet Take 10 mg by mouth daily.    Historical Provider, MD     Allergies:  No Known Allergies  Social History:   reports that she has been smoking Cigarettes.  She has been smoking about 1.50 packs per day.  She does not have any smokeless tobacco history on file. She reports that she uses drugs, including Marijuana. She reports that she does not drink alcohol.  Family History: No family history on file.   Physical Exam: Vitals:   03/23/16 0246 03/23/16 0300 03/23/16 0331 03/23/16 0530  BP:  97/84 120/67  94/70  Pulse:  95 92 91  Resp:      Temp:      TempSrc:      SpO2: 97% 95% 95% 97%  Weight:      Height:       Blood pressure 94/70, pulse 91, temperature 97.6 F (36.4 C), temperature source Oral, resp. rate 18, height 5\' 5"  (1.651 m), weight 122.5 kg (270 lb), last menstrual period 03/13/2016, SpO2 97 %.  GEN:  Pleasant patient lying in the stretcher in no acute distress; cooperative with exam. PSYCH:  alert and oriented x4; does not appear anxious or depressed; affect is appropriate. HEENT: Mucous membranes pink and anicteric; PERRLA; EOM intact; no cervical lymphadenopathy nor thyromegaly or carotid bruit; no JVD; There were no stridor. Neck is very supple. Breasts:: Not examined CHEST WALL: No tenderness CHEST: Normal respiration, clear to auscultation bilaterally.  HEART: Regular rate and rhythm.  There are no murmur, rub, or gallops.   BACK: No kyphosis or scoliosis; no CVA tenderness ABDOMEN: soft and non-tender; no masses, no organomegaly, normal abdominal bowel sounds; no pannus; no intertriginous candida. There is no rebound and no distention. Rectal Exam: Not done EXTREMITIES: No bone or joint deformity; age-appropriate arthropathy of the hands and knees; no edema; no ulcerations.  There is no calf tenderness. Genitalia: not examined PULSES: 2+ and symmetric SKIN: Normal hydration no rash or ulceration CNS: Cranial nerves 2-12 grossly intact no focal lateralizing neurologic deficit.  Speech is fluent; uvula elevated with phonation, facial symmetry and tongue midline. DTR are normal bilaterally, cerebella exam is intact, barbinski is negative and strengths are equaled bilaterally.  No sensory loss.   Labs on Admission:  Basic Metabolic Panel:  Recent Labs Lab 03/23/16 0400  NA 140  K 3.8  CL 108  CO2 26  GLUCOSE 116*  BUN 13  CREATININE 0.83  CALCIUM 8.7*   CBC:  Recent Labs Lab 03/23/16 0400  WBC 6.1  NEUTROABS 3.3  HGB 11.1*  HCT 34.6*  MCV 77.9*  PLT  374   Cardiac Enzymes:  Recent Labs Lab 03/23/16 0400  TROPONINI <0.03   Radiological Exams on Admission: Ct Angio Chest/abd/pel For Dissection W And/or W/wo  Result Date: 03/23/2016 CLINICAL DATA:  35 year old female with known DVT. Concern for pulmonary embolus. EXAM: CT ANGIOGRAPHY CHEST, ABDOMEN AND PELVIS TECHNIQUE: Multidetector CT imaging through the chest, abdomen and pelvis was performed using the standard protocol during bolus administration of intravenous contrast. Multiplanar reconstructed images and MIPs were obtained and reviewed to evaluate the vascular anatomy. CONTRAST:  100 cc Isovue 370 COMPARISON:  None. FINDINGS: Evaluation is limited due to streak artifact caused by patient's body habitus as well as suboptimal opacification of the vasculature. CTA CHEST FINDINGS The lungs are clear. There is no pleural effusion or pneumothorax. The central airways are patent. The thoracic aorta appears unremarkable. The origins of the great vessels of the aortic arch appear patent. Evaluation of the pulmonary arteries is very limited as this study was performed with aortic dissection protocol. There is however intraluminal filling defects involving the visualized bilateral lower lobe pulmonary artery branches compatible with pulmonary emboli. There is no cardiomegaly or pericardial effusion.  No definite CT evidence of right cardiac straining. No hilar or mediastinal adenopathy. The esophagus is grossly unremarkable. No thyroid nodules identified. There is no axillary adenopathy. The chest wall soft tissues appear unremarkable. The osseous structures are intact. Review of the MIP images confirms the above findings. CTA ABDOMEN AND PELVIS FINDINGS No intra-abdominal free air.  Trace free fluid within the pelvis. The liver, gallbladder, pancreas, spleen, adrenal glands, kidneys, visualized ureters, and urinary bladder appear unremarkable. The uterus is anteverted and grossly unremarkable. The ovaries  are grossly unremarkable. There is no evidence of bowel obstruction or active inflammation. Normal appendix. The abdominal aorta appears unremarkable. The origins of the celiac axis, SMA, IMA appear patent. No portal venous gas identified. An infrarenal IVC filter is noted. An area of non enhancement within the IVC extending from the IVC filter to the suprarenal IVC is not well evaluated and may represent mixing artifact. Thrombus is not excluded. There is no adenopathy. The abdominal wall soft tissues appear unremarkable. The osseous structures are intact. Review of the MIP images confirms the above findings. IMPRESSION: Very limited study for evaluation of the pulmonary artery due to suboptimal opacification and timing of the contrast. Bilateral pulmonary artery emboli involving the lobar and segmental branches. No definite CT evidence of right cardiac straining. No CT evidence of aortic dissection or aneurysm. Infrarenal IVC filter. Hypoenhancing area within the IVC extending from the level of the filter to the suprarenal IVC likely represents mixing artifact and less likely thrombus. These results were called by telephone at 5:15 a.m. on 03/23/2016 to Dr. Rochele Raring , who verbally acknowledged these results. Electronically Signed   By: Elgie Collard M.D.   On: 03/23/2016 05:41   EKG: Independently reviewed.   Assessment/Plan Present on Admission: . Pulmonary emboli (HCC) . DVT (deep venous thrombosis) (HCC) . Thyroid condition  PLAN:  Will admit her for acute DVT and bilateral PE, recurrent, with subTherapeutic INR and due to starting Coumadin without bridging.  She is actually hypercoagulable at this time during the initiating of her Coumadin.  She will need to have bridging with either Lovenox or Coumadin, and preferably 2 days overlap after INR became therapeutic between 2 and 3.  Will admit her for bridging.  Continue Coumadin.  She should take Coumadin for life.  She is hemodynamically  stable.   DM:  Will check CBG and SSI.  Thyroid disease:  Will check TSH.  Tobacco abuse:  She must stop in this setting.    Other plans as per orders. Code Status: FULL Unk Lightning, MD. FACP [facility] Pager [phone] 7pm to 7am.  03/23/2016, 6:06 AM

## 2016-03-23 NOTE — ED Notes (Signed)
MD at bedside. 

## 2016-03-23 NOTE — ED Notes (Signed)
IV reestablished by Christen Bame, RN

## 2016-03-23 NOTE — ED Notes (Signed)
Report to Suzette Battiest, RN

## 2016-03-23 NOTE — ED Notes (Signed)
Patient transported to CT 

## 2016-03-23 NOTE — ED Notes (Signed)
Pt from CT Family to bedside Dr Elesa Massed in to speak with pt regarding results

## 2016-03-23 NOTE — Progress Notes (Signed)
Notified Dr. Leta Speller of patients request for Dilaudid vs her Lortab she has ordered.  Also she request her home inahler of Albuterol 2 puffs Q 4hrs PRN.  He states it is ok to have the inhaler but she should resume the Lortab.  Both of [place] agree that she appears to be comfortable at this time using the Loratab.  I will discuss with the patient.

## 2016-03-23 NOTE — Progress Notes (Signed)
ANTICOAGULATION CONSULT NOTE - follow up  Pharmacy Consult for heparin Indication: pulmonary embolus  No Known Allergies  Patient Measurements: Height: 5\' 5"  (165.1 cm) Weight: 270 lb (122.5 kg) IBW/kg (Calculated) : 57 Heparin Dosing Weight: 86.6  Vital Signs: Temp: 98.2 F (36.8 C) (07/24 1025) Temp Source: Oral (07/24 1014) BP: 114/75 (07/24 1025) Pulse Rate: 80 (07/24 1025)  Labs:  Recent Labs  03/23/16 0400 03/23/16 1229  HGB 11.1*  --   HCT 34.6*  --   PLT 374  --   APTT 30  --   LABPROT 14.8  --   INR 1.14  --   HEPARINUNFRC <0.10* 0.47  CREATININE 0.83  --   TROPONINI <0.03  --    Estimated Creatinine Clearance: 124.3 mL/min (by C-G formula based on SCr of 0.83 mg/dL).  Medical History: Past Medical History:  Diagnosis Date  . Asthma   . Diabetes mellitus without complication (HCC)   . DVT (deep venous thrombosis) (HCC)   . Pulmonary emboli (HCC)   . Sleep apnea   . Thyroid disease    Medications:  See medication history Last dose of eliquis was Sat per RN  Assessment: 35 yo lady to start heparin for PE.  She was on eliquis PTA with plan to start coumadin per MD's note.  She has a h/o noncompliance.  Heparin level is at goal.  Goal of Therapy:  Heparin level 0.3-0.7 units/ml Monitor platelets by anticoagulation protocol: Yes   Plan:  Continue Heparin drip at 1400 units/hr Heparin level and CBC daily Plan to transition to Lovenox and start Warfarin on 7/25 per MD request Monitor for bleeding complications  Valrie Hart A 03/23/2016,1:17 PM

## 2016-03-23 NOTE — Progress Notes (Signed)
PROGRESS NOTE                                                                                                                                                                                                             Patient Demographics:    Donna Douglas, is a 35 y.o. female, DOB - 1981/01/14, ZOX:[id number]  Admit date - 03/23/2016   Admitting Physician Leroy Sea, MD  Outpatient Primary MD for the patient is PROVIDER NOT IN SYSTEM  LOS - 0  Chief Complaint  Patient presents with  . Leg Pain    DVT       Brief Narrative    Donna Douglas is an 35 y.o. 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  She presented with left leg pain, in the ER workup showed bilateral PE and left lower extremity acute DVT.   Subjective:    Donna Douglas today has, No headache, No chest pain, No abdominal pain - No Nausea, No new weakness tingling or numbness, No Cough - Mild shortness of breath on exertion.   Assessment  & Plan :     1.Left lower extremity Acute DVT, bilateral PE. In a patient with history of multiple blood clots, noncompliant with Coumadin, insurance did not cover Eliquis. Counseled extensively on compliance, currently on heparin drip, will transition to Lovenox and Coumadin overlap on 03/24/2016 and discharge home soon. She is currently hemodynamically stable and not short of breath.  Her PE is either native blood clot or bypass through IVC filter. We will have her follow outpatient with hematology as well. Most of her problems are due to noncompliance, morbid obesity, ongoing  smoking with possible underlying genetic component.  2. Morbid obesity. Follow-up with PCP for weight reduction.  3. Ongoing smoking. Counseled to quit.  4. Asthma. Stable. Supportive care.   5. Hypothyroidism. Continue Synthroid.   6. Insulin-dependent DM type II. Currently on sliding scale will monitor.  Lab Results  Component Value Date   HGBA1C 6.7 (H) 01/05/2016  CBG (last 3)   Recent Labs  03/23/16 0722  GLUCAP 119*      Family Communication  :  Husband bedside  Code Status :  Full  Diet : Carbohydrate Modified  Disposition Plan  :  Home in 1-2 days  Consults  :   Case management  Procedures  :    CT angiogram chest confirming bilateral PE, possible IVC filter clot.  Lower extremity venous duplex confirming acute DVT in the left leg  DVT Prophylaxis  :  Heparin  GTT then Lovenox  Lab Results  Component Value Date   PLT 374 03/23/2016    Inpatient Medications  Scheduled Meds: . albuterol  2 puff Inhalation Q4H  . insulin aspart  0-15 Units Subcutaneous TID WC  . insulin aspart  0-5 Units Subcutaneous QHS   Continuous Infusions: . sodium chloride    . heparin 1,400 Units/hr (03/23/16 0631)   PRN Meds:.acetaminophen, HYDROcodone-acetaminophen, ondansetron (ZOFRAN) IV, oxyCODONE-acetaminophen  Antibiotics  :    Anti-infectives    None         Objective:   Vitals:   03/23/16 0610 03/23/16 0630 03/23/16 0719 03/23/16 1014  BP: 111/68 120/80 112/76 105/71  Pulse: 84 81 79 76  Resp: Temp:    98.2 F (36.8 C)  TempSrc:    Oral  SpO2: 99% 97% 91% 100%  Weight:      Height:        Wt Readings from Last 3 Encounters:  03/23/16 122.5 kg (270 lb)  03/14/16 129.3 kg (285 lb)  03/09/16 129.3 kg (285 lb)    No intake or output data in the 24 hours ending 03/23/16 1022   Physical Exam  Awake Alert, Oriented X 3, No new F.N deficits, Normal affect Zena.AT,PERRAL Supple Neck,No JVD, No cervical lymphadenopathy  appriciated.  Symmetrical Chest wall movement, Good air movement bilaterally, CTAB RRR,No Gallops,Rubs or new Murmurs, No Parasternal Heave +ve B.Sounds, Abd Soft, No tenderness, No organomegaly appriciated, No rebound - guarding or rigidity. No Cyanosis, Clubbing or edema, No new Rash or bruise, L leg swollen    Data Review:    CBC  Recent Labs Lab 03/23/16 0400  WBC 6.1  HGB 11.1*  HCT 34.6*  PLT 374  MCV 77.9*  MCH 25.0*  MCHC 32.1  RDW 16.1*  LYMPHSABS 2.2  MONOABS 0.4  EOSABS 0.2  BASOSABS 0.0    Chemistries   Recent Labs Lab 03/23/16 0400  NA 140  K 3.8  CL 108  CO2 26  GLUCOSE 116*  BUN 13  CREATININE 0.83  CALCIUM 8.7*   ------------------------------------------------------------------------------------------------------------------ No results for input(s): CHOL, HDL, LDLCALC, TRIG, CHOLHDL, LDLDIRECT in the last 72 hours.  Lab Results  Component Value Date   HGBA1C 6.7 (H) 01/05/2016   ------------------------------------------------------------------------------------------------------------------  Recent Labs  03/23/16 0400  TSH 1.648   ------------------------------------------------------------------------------------------------------------------ No results for input(s): VITAMINB12, FOLATE, FERRITIN, TIBC, IRON, RETICCTPCT in the last 72 hours.  Coagulation profile  Recent Labs Lab 03/23/16 0400  INR 1.14    No results for input(s): DDIMER in the last 72 hours.  Cardiac Enzymes  Recent Labs Lab 03/23/16 0400  TROPONINI <0.03   ------------------------------------------------------------------------------------------------------------------ No results found for: BNP  Micro Results No results found for this or any previous visit (from the past 240 hour(s)).  Radiology Reports US Venous Img Lower Unilateral Left  Result Date: 03/14/2016 CLINICAL DATA:  35 year old female with a history of left leg pain EXAM: LEFT LOWER  EXTREMITY VENOUS DOPPLER ULTRASOUND TECHNIQUE: Gray-scale sonography with graded compression, as well as color Doppler and duplex ultrasound were performed to evaluate the lower extremity deep venous systems from the level of the common femoral vein and including the common femoral, femoral, profunda femoral, popliteal and calf veins including the posterior tibial, peroneal and gastrocnemius veins when visible. The superficial great saphenous vein was also interrogated. Spectral Doppler was utilized to evaluate flow at rest and with distal augmentation maneuvers in the common femoral, femoral and popliteal veins. COMPARISON:  None. FINDINGS: Contralateral Common Femoral Vein: Respiratory phasicity is normal and symmetric with the symptomatic side. No evidence of thrombus. Normal compressibility. Common Femoral Vein: No evidence of thrombus. Normal compressibility, respiratory phasicity and response to augmentation. Saphenofemoral Junction: No evidence of thrombus. Normal compressibility and flow on color Doppler imaging. Profunda Femoral Vein: No evidence of thrombus. Normal compressibility and flow on color Doppler imaging. Femoral Vein: No evidence of thrombus. Normal compressibility, respiratory phasicity and response to augmentation. Popliteal Vein: Occlusive thrombus of the right popliteal vein extending distally into the posterior tibial vein. Calf Veins: No evidence of thrombus. Normal compressibility and flow on color Doppler imaging. Superficial Great Saphenous Vein: Superficial thrombophlebitis of the distal great saphenous vein at the ankle Other Findings:  None. IMPRESSION: Sonographic survey of the left lower extremity is positive for DVT of the left popliteal vein, which extends distally to the posterior tibial vein. Superficial thrombophlebitis involving the great saphenous vein at the ankle These results will be called to the ordering clinician or representative by the Radiologist Assistant, and  communication documented in the PACS or zVision Dashboard. Signed, Yvone Neu. Loreta Ave, DO [facility] Electronically Signed   By: Gilmer Mor D.O.   On: 03/14/2016 12:09   Dg Foot Complete Left  Result Date: 03/14/2016 CLINICAL DATA:  Left foot pain and swelling for 2 days. No injury. History of diabetes and DVT EXAM: LEFT FOOT - COMPLETE 3+ VIEW COMPARISON:  03/07/2016 FINDINGS: Dorsal soft tissue swelling over the left foot similar to prior study. No radiopaque soft tissue foreign bodies or soft tissue gas collections. No focal bone lesion or bone sclerosis. No bone erosion. No evidence of osteomyelitis. No evidence of acute fracture or subluxation. Small plantar calcaneal spur. IMPRESSION: Soft tissue swelling over the dorsum of the left foot. No acute bony abnormalities. No evidence of osteomyelitis Electronically Signed   By: Burman Nieves M.D.   On: 03/14/2016 03:44   Dg Foot Complete Left  Result Date: 03/07/2016 CLINICAL DATA:  LEFT hindfoot pain, no injury. History of deep vein thrombosis and diabetes. EXAM: LEFT FOOT - COMPLETE 3+ VIEW COMPARISON:  None. FINDINGS: There is no evidence of fracture or dislocation. Small plantar calcaneal spur. There is no evidence of arthropathy or other focal bone abnormality. Generalized soft tissue swelling without subcutaneous gas or radiopaque foreign bodies. IMPRESSION: Soft tissue swelling without acute osseous process. Electronically Signed   By: Awilda Metro M.D.   On: 03/07/2016 02:16   Ct Angio Chest/abd/pel For Dissection W And/or W/wo  Result Date: 03/23/2016 CLINICAL DATA:  35 year old female with known DVT. Concern for pulmonary embolus. EXAM: CT ANGIOGRAPHY CHEST, ABDOMEN AND PELVIS TECHNIQUE: Multidetector CT imaging through the chest, abdomen and pelvis was performed using the standard protocol during bolus administration of intravenous contrast. Multiplanar reconstructed images  and MIPs were obtained and reviewed to evaluate the vascular anatomy. CONTRAST:  100 cc Isovue 370 COMPARISON:  None. FINDINGS: Evaluation is limited  due to streak artifact caused by patient's body habitus as well as suboptimal opacification of the vasculature. CTA CHEST FINDINGS The lungs are clear. There is no pleural effusion or pneumothorax. The central airways are patent. The thoracic aorta appears unremarkable. The origins of the great vessels of the aortic arch appear patent. Evaluation of the pulmonary arteries is very limited as this study was performed with aortic dissection protocol. There is however intraluminal filling defects involving the visualized bilateral lower lobe pulmonary artery branches compatible with pulmonary emboli. There is no cardiomegaly or pericardial effusion. No definite CT evidence of right cardiac straining. No hilar or mediastinal adenopathy. The esophagus is grossly unremarkable. No thyroid nodules identified. There is no axillary adenopathy. The chest wall soft tissues appear unremarkable. The osseous structures are intact. Review of the MIP images confirms the above findings. CTA ABDOMEN AND PELVIS FINDINGS No intra-abdominal free air.  Trace free fluid within the pelvis. The liver, gallbladder, pancreas, spleen, adrenal glands, kidneys, visualized ureters, and urinary bladder appear unremarkable. The uterus is anteverted and grossly unremarkable. The ovaries are grossly unremarkable. There is no evidence of bowel obstruction or active inflammation. Normal appendix. The abdominal aorta appears unremarkable. The origins of the celiac axis, SMA, IMA appear patent. No portal venous gas identified. An infrarenal IVC filter is noted. An area of non enhancement within the IVC extending from the IVC filter to the suprarenal IVC is not well evaluated and may represent mixing artifact. Thrombus is not excluded. There is no adenopathy. The abdominal wall soft tissues appear  unremarkable. The osseous structures are intact. Review of the MIP images confirms the above findings. IMPRESSION: Very limited study for evaluation of the pulmonary artery due to suboptimal opacification and timing of the contrast. Bilateral pulmonary artery emboli involving the lobar and segmental branches. No definite CT evidence of right cardiac straining. No CT evidence of aortic dissection or aneurysm. Infrarenal IVC filter. Hypoenhancing area within the IVC extending from the level of the filter to the suprarenal IVC likely represents mixing artifact and less likely thrombus. These results were called by telephone at 5:15 a.m. on 03/23/2016 to Dr. Rochele Raring , who verbally acknowledged these results. Electronically Signed   By: Elgie Collard M.D.   On: 03/23/2016 05:41   Time Spent in minutes  30   SINGH,PRASHANT K M.D on 03/23/2016 at 10:22 AM  Between 7am to 7pm - Pager - [phone]  After 7pm go to [email or web address] - password [facility]  [facility] -  Office  [phone]

## 2016-03-23 NOTE — Progress Notes (Signed)
ANTICOAGULATION CONSULT NOTE - Initial Consult  Pharmacy Consult for heparin Indication: pulmonary embolus  No Known Allergies  Patient Measurements: Height: 5\' 5"  (165.1 cm) Weight: 270 lb (122.5 kg) IBW/kg (Calculated) : 57 Heparin Dosing Weight: 86.6  Vital Signs: Temp: 97.6 F (36.4 C) (07/24 0242) Temp Source: Oral (07/24 0242) BP: 111/68 (07/24 0610) Pulse Rate: 84 (07/24 0610)  Labs:  Recent Labs  03/23/16 0400  HGB 11.1*  HCT 34.6*  PLT 374  LABPROT 14.8  INR 1.14  CREATININE 0.83  TROPONINI <0.03    Estimated Creatinine Clearance: 124.3 mL/min (by C-G formula based on SCr of 0.83 mg/dL).   Medical History: Past Medical History:  Diagnosis Date  . Asthma   . Diabetes mellitus without complication (HCC)   . DVT (deep venous thrombosis) (HCC)   . Pulmonary emboli (HCC)   . Sleep apnea   . Thyroid disease     Medications:  See medication history Last dose of eliquis was Sat per RN  Assessment: 35 yo lady to start heparin for PE.  She was on eliquis PTA with plan to start coumadin per MD's note.  She has a h/o noncompliance. Goal of Therapy:  Heparin level 0.3-0.7 units/ml Monitor platelets by anticoagulation protocol: Yes   Plan:  Heparin bolus 4000 units and drip at 1400 units/hr Check baseline aPTT and HL. Check HL 6 hours after start of drip Monitor for bleeding complications  Donna Douglas 03/23/2016,6:15 AM

## 2016-03-23 NOTE — ED Notes (Signed)
At return from CT pt IV blown- would not flush- IV removed

## 2016-03-24 DIAGNOSIS — E079 Disorder of thyroid, unspecified: Secondary | ICD-10-CM

## 2016-03-24 DIAGNOSIS — I2699 Other pulmonary embolism without acute cor pulmonale: Secondary | ICD-10-CM

## 2016-03-24 DIAGNOSIS — E0800 Diabetes mellitus due to underlying condition with hyperosmolarity without nonketotic hyperglycemic-hyperosmolar coma (NKHHC): Secondary | ICD-10-CM

## 2016-03-24 DIAGNOSIS — I82402 Acute embolism and thrombosis of unspecified deep veins of left lower extremity: Principal | ICD-10-CM

## 2016-03-24 LAB — CBC
HCT: 30.6 % — ABNORMAL LOW (ref 36.0–46.0)
Hemoglobin: 9.7 g/dL — ABNORMAL LOW (ref 12.0–15.0)
MCH: 25.1 pg — ABNORMAL LOW (ref 26.0–34.0)
MCHC: 31.7 g/dL (ref 30.0–36.0)
MCV: 79.3 fL (ref 78.0–100.0)
Platelets: 338 10*3/uL (ref 150–400)
RBC: 3.86 MIL/uL — ABNORMAL LOW (ref 3.87–5.11)
RDW: 16.5 % — ABNORMAL HIGH (ref 11.5–15.5)
WBC: 5.1 10*3/uL (ref 4.0–10.5)

## 2016-03-24 LAB — GLUCOSE, CAPILLARY
Glucose-Capillary: 122 mg/dL — ABNORMAL HIGH (ref 65–99)
Glucose-Capillary: 99 mg/dL (ref 65–99)

## 2016-03-24 LAB — BASIC METABOLIC PANEL
Anion gap: 3 — ABNORMAL LOW (ref 5–15)
BUN: 11 mg/dL (ref 6–20)
CO2: 24 mmol/L (ref 22–32)
Calcium: 8.2 mg/dL — ABNORMAL LOW (ref 8.9–10.3)
Chloride: 110 mmol/L (ref 101–111)
Creatinine, Ser: 0.69 mg/dL (ref 0.44–1.00)
GFR calc Af Amer: >60 mL/min (ref >60)
GFR calc non Af Amer: >60 mL/min (ref >60)
Glucose, Bld: 113 mg/dL — ABNORMAL HIGH (ref 65–99)
Potassium: 4 mmol/L (ref 3.5–5.1)
Sodium: 137 mmol/L (ref 135–145)

## 2016-03-24 LAB — HEPARIN LEVEL (UNFRACTIONATED): Heparin Unfractionated: 0.4 IU/mL (ref 0.30–0.70)

## 2016-03-24 LAB — PROTIME-INR
INR: 1.21 (ref 0.00–1.49)
Prothrombin Time: 15.4 seconds — ABNORMAL HIGH (ref 11.6–15.2)

## 2016-03-24 MED ORDER — ENOXAPARIN SODIUM 120 MG/0.8ML ~~LOC~~ SOLN: 120.0000 mg | Freq: Two times a day (BID) | SUBCUTANEOUS | 0 refills | Status: DC

## 2016-03-24 MED ORDER — VENTOLIN HFA 108 (90 BASE) MCG/ACT IN AERS: 2.0000 | INHALATION_SPRAY | RESPIRATORY_TRACT | 0 refills | Status: DC

## 2016-03-24 MED ORDER — ENOXAPARIN SODIUM 120 MG/0.8ML ~~LOC~~ SOLN
120.0000 mg | Freq: Two times a day (BID) | SUBCUTANEOUS | Status: DC
  Administered 2016-03-24: 120 mg via SUBCUTANEOUS
  Filled 2016-03-24 (×3): qty 0.8

## 2016-03-24 MED ORDER — WARFARIN SODIUM 5 MG PO TABS
10.0000 mg | ORAL_TABLET | Freq: Once | ORAL | Status: AC
  Administered 2016-03-24: 10 mg via ORAL
  Filled 2016-03-24: qty 2

## 2016-03-24 MED ORDER — WARFARIN - PHARMACIST DOSING INPATIENT: Status: DC

## 2016-03-24 NOTE — Discharge Instructions (Signed)
Follow with Primary MD  in 2-3 days   Get CBC, CMP, INR, by Primary MD 2-3 days ( we routinely change or add medications that can affect your baseline labs and fluid status, therefore we recommend that you get the mentioned basic workup next visit with your PCP, your PCP may decide not to get them or add new tests based on their clinical decision)  Clarify with your Nurse about Lovenox and Coumadin education prior to discharge if you have any questions.  Stop Lovenox shots when INR reaches  2 , Your INR goal is 2 to 3    Get INR checked by PCP in 2-3  Days and get Coumadin dose adjusted, you should have your INR and Coumadin dose monitored by your Prim.MD initially twice/week for 4 weeks and then every 2 weeks as long as you take Coumadin.  Obeserve full fall precautions.   Activity: As tolerated with Full fall precautions use walker/cane & assistance as needed   Disposition Home     Diet:   Heart Healthy Low Carb .  For Heart failure patients - Check your Weight same time everyday, if you gain over 2 pounds, or you develop in leg swelling, experience more shortness of breath or chest pain, call your Primary MD immediately. Follow Cardiac Low Salt Diet and 1.5 lit/day fluid restriction.   On your next visit with your primary care physician please Get Medicines reviewed and adjusted.   Please request your Prim.MD to go over all Hospital Tests and Procedure/Radiological results at the follow up, please get all Hospital records sent to your Prim MD by signing hospital release before you go home.   If you experience worsening of your admission symptoms, develop shortness of breath, life threatening emergency, suicidal or homicidal thoughts you must seek medical attention immediately by calling 911 or calling your MD immediately  if symptoms less severe.  You Must read complete instructions/literature along with all the possible adverse reactions/side effects for all the Medicines you take  and that have been prescribed to you. Take any new Medicines after you have completely understood and accpet all the possible adverse reactions/side effects.   Do not drive, operate heavy machinery, perform activities at heights, swimming or participation in water activities or provide baby sitting services if your were admitted for syncope or siezures until you have seen by Primary MD or a Neurologist and advised to do so again.  Do not drive when taking Pain medications.    Do not take more than prescribed Pain, Sleep and Anxiety Medications  Special Instructions: If you have smoked or chewed Tobacco  in the last 2 yrs please stop smoking, stop any regular Alcohol  and or any Recreational drug use.  Wear Seat belts while driving.   Please note  You were cared for by a hospitalist during your hospital stay. If you have any questions about your discharge medications or the care you received while you were in the hospital after you are discharged, you can call the unit and asked to speak with the hospitalist on call if the hospitalist that took care of you is not available. Once you are discharged, your primary care physician will handle any further medical issues. Please note that NO REFILLS for any discharge medications will be authorized once you are discharged, as it is imperative that you return to your primary care physician (or establish a relationship with a primary care physician if you do not have one) for your aftercare needs  so that they can reassess your need for medications and monitor your lab values.

## 2016-03-24 NOTE — Progress Notes (Signed)
Patient's IV removed.  Site WNL.  Prescriptions for inhaler and Lovenox given to patient.  Patient verbalized understanding of Lovenox and able to return demonstrate.  Patient verbalized giving herself Lovenox during her pregnancy.  Patient verbalized understanding of discharge instructions, physician follow-up, medications.  Patient reports belongings intact and in possession at time of discharge.  Patient transported by NT via w/c to main entrance for discharge.  Patient stable at time of discharge.

## 2016-03-24 NOTE — Discharge Summary (Signed)
Donna Douglas WUJ:[id number] DOB: 06-23-1981 DOA: 03/23/2016  PCP: PROVIDER NOT IN SYSTEM  Admit date: 03/23/2016  Discharge date: 03/24/2016  Admitted From: Home   Disposition:  Home   Recommendations for Outpatient Follow-up:   Follow up with PCP in 1-2 weeks  PCP Please obtain BMP/CBC, 2 view CXR in 1week,  (see Discharge instructions)   PCP Please follow up on the following pending results: Monitor INR closely, stop Lovenox once INR is therapeutic for at least 2 days   Home Health: None   Equipment/Devices: None  Consultations: None Discharge Condition: Stable   CODE STATUS: Full   Diet Recommendation: Heart healthy - Low Carb   Chief Complaint  Patient presents with  . 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  She presented with left leg pain, in the ER workup showed bilateral PE and left lower extremity acute DVT.  Hospital issues addressed    1.Left lower extremity Acute DVT, bilateral PE. In a patient with history of multiple blood clots, noncompliant with Coumadin, insurance did not cover Eliquis. Counseled extensively on compliance, She was kept on heparin drip for 24 hours then transitioned to Lovenox and Coumadin overlap, she is completely symptom free sitting comfortably in bed, will be discharged on Lovenox Coumadin  overlap with close follow-up with PCP. Request PCP to monitor INR closely and can consider outpatient hematology follow-up one time. Patient has been counseled to stop smoking, not to use birth control pills. Also advised to lose weight.  Her PE is either native blood clot or bypass through IVC filter. Most of her problems are due to noncompliance, she was started on Coumadin per patient few weeks ago but she was noncompliant with it and was taking it intermittently only, morbid obesity, ongoing smoking with possible underlying genetic component.   2. Morbid obesity. Follow-up with PCP for weight reduction.  3. Ongoing smoking. Counseled to quit.  4. Asthma. Stable. Supportive care.   5. Hypothyroidism. Continue Synthroid.   6. Non Insulin-dependent DM type II. Diet controlled.  Lab Results  Component Value Date   HGBA1C 6.7 (H) 01/05/2016   CBG (last 3)   Recent Labs  03/23/16 1612 03/23/16 2206 03/24/16 0800  GLUCAP 112* 101* 122*      Discharge diagnosis     Principal Problem:   Pulmonary emboli (HCC) Active Problems:   Deep vein thrombosis (DVT) of left lower extremity (HCC)   DM (diabetes mellitus) (HCC)   Thyroid condition    Discharge instructions    Discharge Instructions  Diet - low sodium heart healthy    Complete by:  As directed   Discharge instructions    Complete by:  As directed   Follow with Primary MD  in 2-3 days   Get CBC, CMP, INR, by Primary MD 2-3 days ( we routinely change or add medications that can affect your baseline labs and fluid status, therefore we recommend that you get the mentioned basic workup next visit with your PCP, your PCP may decide not to get them or add new tests based on their clinical decision)  Clarify with your Nurse about Lovenox and Coumadin education prior to discharge if you have any questions.  Stop Lovenox shots when INR reaches  2 , Your INR goal is 2 to 3    Get INR checked by PCP in 2-3  Days and  get Coumadin dose adjusted, you should have your INR and Coumadin dose monitored by your Prim.MD initially twice/week for 4 weeks and then every 2 weeks as long as you take Coumadin.  Obeserve full fall precautions.   Activity: As tolerated with Full fall precautions use walker/cane & assistance as needed   Disposition Home     Diet:   Heart Healthy .  For Heart failure patients - Check your Weight same time everyday, if you gain over 2 pounds, or you develop in leg swelling, experience more shortness of breath or chest pain, call your Primary MD immediately. Follow Cardiac Low Salt Diet and 1.5 lit/day fluid restriction.   On your next visit with your primary care physician please Get Medicines reviewed and adjusted.   Please request your Prim.MD to go over all Hospital Tests and Procedure/Radiological results at the follow up, please get all Hospital records sent to your Prim MD by signing hospital release before you go home.   If you experience worsening of your admission symptoms, develop shortness of breath, life threatening emergency, suicidal or homicidal thoughts you must seek medical attention immediately by calling 911 or calling your MD immediately  if symptoms less severe.  You Must read complete instructions/literature along with all the possible adverse reactions/side effects for all the Medicines you take and that have been prescribed to you. Take any new Medicines after you have completely understood and accpet all the possible adverse reactions/side effects.   Do not drive, operate heavy machinery, perform activities at heights, swimming or participation in water activities or provide baby sitting services if your were admitted for syncope or siezures until you have seen by Primary MD or a Neurologist and advised to do so again.  Do not drive when taking Pain medications.    Do not take more than prescribed Pain, Sleep and Anxiety Medications  Special Instructions:  If you have smoked or chewed Tobacco  in the last 2 yrs please stop smoking, stop any regular Alcohol  and or any Recreational drug use.  Wear Seat belts while driving.   Please note  You were cared for by a hospitalist during your hospital stay. If you have any questions about your discharge medications or the care you received while you were in the hospital after you are discharged, you can call the unit and asked to speak with the hospitalist on call if the hospitalist that took care of you is not available. Once you are discharged, your primary care physician will handle any further medical issues. Please note that NO REFILLS for any discharge medications will be authorized once you are discharged, as it is imperative  that you return to your primary care physician (or establish a relationship with a primary care physician if you do not have one) for your aftercare needs so that they can reassess your need for medications and monitor your lab values.   Increase activity slowly    Complete by:  As directed      Discharge Medications     Medication List    TAKE these medications   acetaminophen 500 MG tablet Commonly known as:  TYLENOL Take 1,000 mg by mouth every 6 (six) hours as needed for mild pain.   enoxaparin 120 MG/0.8ML injection Commonly known as:  LOVENOX Inject 0.8 mLs (120 mg total) into the skin every 12 (twelve) hours. Dispense one week supply as needed, no refills   ibuprofen 800 MG tablet Commonly known as:  ADVIL,MOTRIN Take 1 tablet (800 mg total) by mouth 3 (three) times daily.   VENTOLIN HFA 108 (90 Base) MCG/ACT inhaler Generic drug:  albuterol Inhale 2 puffs into the lungs every 4 (four) hours.   warfarin 10 MG tablet Commonly known as:  COUMADIN Take 10 mg by mouth daily.       No Known Allergies  Follow-up Information    Your PCP. Schedule an appointment as soon as possible for a visit in 2 day(s).           Major procedures and Radiology  Reports - PLEASE review detailed and final reports thoroughly  -       US Venous Img Lower Unilateral Left  Result Date: 03/14/2016 CLINICAL DATA:  35 year old female with a history of left leg pain EXAM: LEFT LOWER EXTREMITY VENOUS DOPPLER ULTRASOUND TECHNIQUE: Gray-scale sonography with graded compression, as well as color Doppler and duplex ultrasound were performed to evaluate the lower extremity deep venous systems from the level of the common femoral vein and including the common femoral, femoral, profunda femoral, popliteal and calf veins including the posterior tibial, peroneal and gastrocnemius veins when visible. The superficial great saphenous vein was also interrogated. Spectral Doppler was utilized to evaluate flow at rest and with distal augmentation maneuvers in the common femoral, femoral and popliteal veins. COMPARISON:  None. FINDINGS: Contralateral Common Femoral Vein: Respiratory phasicity is normal and symmetric with the symptomatic side. No evidence of thrombus. Normal compressibility. Common Femoral Vein: No evidence of thrombus. Normal compressibility, respiratory phasicity and response to augmentation. Saphenofemoral Junction: No evidence of thrombus. Normal compressibility and flow on color Doppler imaging. Profunda Femoral Vein: No evidence of thrombus. Normal compressibility and flow on color Doppler imaging. Femoral Vein: No evidence of thrombus. Normal compressibility, respiratory phasicity and response to augmentation. Popliteal Vein: Occlusive thrombus of the right popliteal vein extending distally into the posterior tibial vein. Calf Veins: No evidence of thrombus. Normal compressibility and flow on color Doppler imaging. Superficial Great Saphenous Vein: Superficial thrombophlebitis of the distal great saphenous vein at the ankle Other Findings:  None. IMPRESSION: Sonographic survey of the left lower extremity is positive for DVT of the left popliteal vein, which extends  distally to the posterior tibial vein. Superficial thrombophlebitis involving the great saphenous vein at the ankle These results will be called to the ordering clinician or representative by the Radiologist Assistant, and communication documented in the PACS or zVision Dashboard. Signed, Yvone Neu. Loreta Ave, DO [facility] Radiology Electronically Signed   By: Gilmer Mor D.O.   On: 03/14/2016 12:09      Ct Angio Chest/abd/pel For Dissection W And/or W/wo  Result  Date: 03/23/2016 CLINICAL DATA:  35 year old female with known DVT. Concern for pulmonary embolus. EXAM: CT ANGIOGRAPHY CHEST, ABDOMEN AND PELVIS TECHNIQUE: Multidetector CT imaging through the chest, abdomen and pelvis was performed using the standard protocol during bolus administration of intravenous contrast. Multiplanar reconstructed images and MIPs were obtained and reviewed to evaluate the vascular anatomy. CONTRAST:  100 cc Isovue 370 COMPARISON:  None. FINDINGS: Evaluation is limited due to streak artifact caused by patient's body habitus as well as suboptimal opacification of the vasculature. CTA CHEST FINDINGS The lungs are clear. There is no pleural effusion or pneumothorax. The central airways are patent. The thoracic aorta appears unremarkable. The origins of the great vessels of the aortic arch appear patent. Evaluation of the pulmonary arteries is very limited as this study was performed with aortic dissection protocol. There is however intraluminal filling defects involving the visualized bilateral lower lobe pulmonary artery branches compatible with pulmonary emboli. There is no cardiomegaly or pericardial effusion. No definite CT evidence of right cardiac straining. No hilar or mediastinal adenopathy. The esophagus is grossly unremarkable. No thyroid nodules identified. There is no axillary adenopathy. The chest wall soft tissues appear unremarkable. The osseous structures are intact.  Review of the MIP images confirms the above findings. CTA ABDOMEN AND PELVIS FINDINGS No intra-abdominal free air.  Trace free fluid within the pelvis. The liver, gallbladder, pancreas, spleen, adrenal glands, kidneys, visualized ureters, and urinary bladder appear unremarkable. The uterus is anteverted and grossly unremarkable. The ovaries are grossly unremarkable. There is no evidence of bowel obstruction or active inflammation. Normal appendix. The abdominal aorta appears unremarkable. The origins of the celiac axis, SMA, IMA appear patent. No portal venous gas identified. An infrarenal IVC filter is noted. An area of non enhancement within the IVC extending from the IVC filter to the suprarenal IVC is not well evaluated and may represent mixing artifact. Thrombus is not excluded. There is no adenopathy. The abdominal wall soft tissues appear unremarkable. The osseous structures are intact. Review of the MIP images confirms the above findings. IMPRESSION: Very limited study for evaluation of the pulmonary artery due to suboptimal opacification and timing of the contrast. Bilateral pulmonary artery emboli involving the lobar and segmental branches. No definite CT evidence of right cardiac straining. No CT evidence of aortic dissection or aneurysm. Infrarenal IVC filter. Hypoenhancing area within the IVC extending from the level of the filter to the suprarenal IVC likely represents mixing artifact and less likely thrombus. These results were called by telephone at 5:15 a.m. on 03/23/2016 to Dr. Rochele Raring , who verbally acknowledged these results. Electronically Signed   By: Elgie Collard M.D.   On: 03/23/2016 05:41   Micro Results     No results found for this or any previous visit (from the past 240 hour(s)).  Today   Subjective    Donna Douglas today has no headache,no chest abdominal pain,no new weakness tingling or numbness, feels much better wants to go home today.     Objective   Blood  pressure (!) 100/48, pulse (!) 56, temperature 98 F (36.7 C), temperature source Oral, resp. rate 20, height 5\' 5"  (1.651 m), weight 122.5 kg (270 lb), last menstrual period 03/13/2016, SpO2 97 %.   Intake/Output Summary (Last 24 hours) at 03/24/16 0807 Last data filed at 03/23/16 1800  Gross per 24 hour  Intake          1330.77 ml  Output  0 ml  Net          1330.77 ml    Exam Awake Alert, Oriented x 3, No new F.N deficits, Normal affect [place].AT,PERRAL Supple Neck,No JVD, No cervical lymphadenopathy appriciated.  Symmetrical Chest wall movement, Good air movement bilaterally, CTAB RRR,No Gallops,Rubs or new Murmurs, No Parasternal Heave +ve B.Sounds, Abd Soft, Non tender, No organomegaly appriciated, No rebound -guarding or rigidity. No Cyanosis, Clubbing or edema, No new Rash or bruise, L leg mildly swollen   Data Review   CBC w Diff: Lab Results  Component Value Date   WBC 5.1 03/24/2016   HGB 9.7 (L) 03/24/2016   HCT 30.6 (L) 03/24/2016   PLT 338 03/24/2016   LYMPHOPCT 36 03/23/2016   MONOPCT 7 03/23/2016   EOSPCT 3 03/23/2016   BASOPCT 0 03/23/2016    CMP: Lab Results  Component Value Date   NA 137 03/24/2016   K 4.0 03/24/2016   CL 110 03/24/2016   CO2 24 03/24/2016   BUN 11 03/24/2016   CREATININE 0.69 03/24/2016   PROT 6.7 01/06/2016   ALBUMIN 3.3 (L) 01/06/2016   BILITOT 0.2 (L) 01/06/2016   ALKPHOS 86 01/06/2016   AST 11 (L) 01/06/2016   ALT 13 (L) 01/06/2016  .   Total Time in preparing paper work, data evaluation and todays exam - 35 minutes  Leroy Sea M.D on 03/24/2016 at 8:07 AM  [facility]   Office  [phone]

## 2016-03-24 NOTE — Care Management Note (Signed)
Case Management Note  Patient Details  Name: Rorie Yuen MRN: [id number] Date of Birth: 15-Apr-1981  Subjective/Objective:   Patient from home, adm with PE. Has [place] medicaid. Will need PCP for follow up labs and xrays.                  Action/Plan: Called [facility] [place], who states patient is currently active with them and patient will need to come by and update her information and then can schedule an appt. With them for follow up.    Expected Discharge Date:                  Expected Discharge Plan:  Home/Self Care  In-House Referral:  NA  Discharge planning Services  CM Consult  Post Acute Care Choice:  NA Choice offered to:  NA  DME Arranged:    DME Agency:     HH Arranged:    HH Agency:     Status of Service:  Completed, signed off  If discussed at [employer] of Stay Meetings, dates discussed:    Additional Comments:  Flemon Kelty, Chrystine Oiler, RN 03/24/2016, 10:37 AM

## 2016-03-24 NOTE — Progress Notes (Signed)
ANTICOAGULATION CONSULT NOTE - follow up  Pharmacy Consult for heparin >> Lovenox and Warfarin Indication: pulmonary embolus  No Known Allergies  Patient Measurements: Height: 5\' 5"  (165.1 cm) Weight: 270 lb (122.5 kg) IBW/kg (Calculated) : 57 Heparin Dosing Weight: 86.6  Vital Signs: Temp: 98 F (36.7 C) (07/25 0631) Temp Source: Oral (07/25 0631) BP: 100/48 (07/25 0631) Pulse Rate: 56 (07/25 0631)  Labs:  Recent Labs  03/23/16 0400 03/23/16 1229 03/24/16 0431  HGB 11.1*  --  9.7*  HCT 34.6*  --  30.6*  PLT 374  --  338  APTT 30  --   --   LABPROT 14.8  --  15.4*  INR 1.14  --  1.21  HEPARINUNFRC <0.10* 0.47 0.40  CREATININE 0.83  --  0.69  TROPONINI <0.03  --   --    Estimated Creatinine Clearance: 128.9 mL/min (by C-G formula based on SCr of 0.8 mg/dL).  Medical History: Past Medical History:  Diagnosis Date  . Asthma   . Diabetes mellitus without complication (HCC)   . DVT (deep venous thrombosis) (HCC)   . Pulmonary emboli (HCC)   . Sleep apnea   . Thyroid disease    Assessment: 35 yo lady has been on heparin for PE.  Reportedly, she was on eliquis PTA with plan to start coumadin per MD's note.  PTA med list shows Coumadin dose of 10mg  daily.  She has a h/o noncompliance.  Heparin level is at goal.  Per MD request plan to transition to Lovenox and Warfarin, d/c Lovenox when INR at goal  Goal of Therapy:  INR 2-3 Monitor platelets by anticoagulation protocol: Yes   Plan:  Lovenox 1mg /Kg SQ q12hrs until INR >/= 2 Coumadin 10mg  po today x 1 INR daily Monitor for bleeding complications, CBC  Valrie Hart, PharmD Clinical Pharmacist Pager:  [phone] 03/24/2016

## 2016-12-19 ENCOUNTER — Encounter (HOSPITAL_COMMUNITY): Payer: Self-pay | Admitting: Emergency Medicine

## 2016-12-19 ENCOUNTER — Emergency Department (HOSPITAL_COMMUNITY)
Admission: EM | Admit: 2016-12-19 | Discharge: 2016-12-19 | Disposition: A | Discharge status: Home / Self Care | Payer: Medicaid - Out of State | Attending: Emergency Medicine | Admitting: Emergency Medicine

## 2016-12-19 ENCOUNTER — Emergency Department (HOSPITAL_COMMUNITY): Payer: Medicaid - Out of State

## 2016-12-19 DIAGNOSIS — R0602 Shortness of breath: Secondary | ICD-10-CM | POA: Insufficient documentation

## 2016-12-19 DIAGNOSIS — E119 Type 2 diabetes mellitus without complications: Secondary | ICD-10-CM | POA: Diagnosis not present

## 2016-12-19 DIAGNOSIS — J45909 Unspecified asthma, uncomplicated: Secondary | ICD-10-CM | POA: Diagnosis not present

## 2016-12-19 DIAGNOSIS — R072 Precordial pain: Secondary | ICD-10-CM | POA: Insufficient documentation

## 2016-12-19 DIAGNOSIS — F17210 Nicotine dependence, cigarettes, uncomplicated: Secondary | ICD-10-CM | POA: Insufficient documentation

## 2016-12-19 DIAGNOSIS — Z79899 Other long term (current) drug therapy: Secondary | ICD-10-CM | POA: Insufficient documentation

## 2016-12-19 DIAGNOSIS — Z7901 Long term (current) use of anticoagulants: Secondary | ICD-10-CM | POA: Insufficient documentation

## 2016-12-19 LAB — CBC
HCT: 35 % — ABNORMAL LOW (ref 36.0–46.0)
Hemoglobin: 11.2 g/dL — ABNORMAL LOW (ref 12.0–15.0)
MCH: 24.4 pg — ABNORMAL LOW (ref 26.0–34.0)
MCHC: 32 g/dL (ref 30.0–36.0)
MCV: 76.3 fL — ABNORMAL LOW (ref 78.0–100.0)
Platelets: 393 10*3/uL (ref 150–400)
RBC: 4.59 MIL/uL (ref 3.87–5.11)
RDW: 16.3 % — ABNORMAL HIGH (ref 11.5–15.5)
WBC: 5.2 10*3/uL (ref 4.0–10.5)

## 2016-12-19 LAB — BASIC METABOLIC PANEL
Anion gap: 5 (ref 5–15)
BUN: 11 mg/dL (ref 6–20)
CO2: 27 mmol/L (ref 22–32)
Calcium: 8.7 mg/dL — ABNORMAL LOW (ref 8.9–10.3)
Chloride: 105 mmol/L (ref 101–111)
Creatinine, Ser: 0.72 mg/dL (ref 0.44–1.00)
GFR calc Af Amer: >60 mL/min (ref >60)
GFR calc non Af Amer: >60 mL/min (ref >60)
Glucose, Bld: 106 mg/dL — ABNORMAL HIGH (ref 65–99)
Potassium: 3.5 mmol/L (ref 3.5–5.1)
Sodium: 137 mmol/L (ref 135–145)

## 2016-12-19 LAB — PROTIME-INR
INR: 2.12
Prothrombin Time: 24.1 seconds — ABNORMAL HIGH (ref 11.4–15.2)

## 2016-12-19 LAB — I-STAT BETA HCG BLOOD, ED (MC, WL, AP ONLY): I-stat hCG, quantitative: <5 m[IU]/mL (ref <5)

## 2016-12-19 LAB — TROPONIN I
Troponin I: <0.03 ng/mL (ref <0.03)
Troponin I: <0.03 ng/mL (ref <0.03)

## 2016-12-19 MED ORDER — IOPAMIDOL (ISOVUE-300) INJECTION 61%: 100.0000 mL | Freq: Once | INTRAVENOUS | Status: DC | PRN

## 2016-12-19 MED ORDER — KETOROLAC TROMETHAMINE 30 MG/ML IJ SOLN
30.0000 mg | Freq: Once | INTRAMUSCULAR | Status: AC
  Administered 2016-12-19: 30 mg via INTRAVENOUS
  Filled 2016-12-19: qty 1

## 2016-12-19 MED ORDER — IOPAMIDOL (ISOVUE-370) INJECTION 76%
100.0000 mL | Freq: Once | INTRAVENOUS | Status: AC | PRN
  Administered 2016-12-19: 100 mL via INTRAVENOUS

## 2016-12-19 MED ORDER — ONDANSETRON HCL 4 MG/2ML IJ SOLN
4.0000 mg | Freq: Once | INTRAMUSCULAR | Status: AC
  Administered 2016-12-19: 4 mg via INTRAVENOUS
  Filled 2016-12-19: qty 2

## 2016-12-19 MED ORDER — FENTANYL CITRATE (PF) 100 MCG/2ML IJ SOLN
25.0000 ug | Freq: Once | INTRAMUSCULAR | Status: AC
  Administered 2016-12-19: 25 ug via INTRAVENOUS
  Filled 2016-12-19: qty 2

## 2016-12-19 MED ORDER — FENTANYL CITRATE (PF) 100 MCG/2ML IJ SOLN
100.0000 ug | Freq: Once | INTRAMUSCULAR | Status: AC
  Administered 2016-12-19: 100 ug via INTRAVENOUS
  Filled 2016-12-19: qty 2

## 2016-12-19 NOTE — Discharge Instructions (Signed)

## 2016-12-19 NOTE — ED Triage Notes (Signed)
Pt states she has been having chest pain and sob for 3 days.  Has history of PE, concerned about another PE

## 2016-12-19 NOTE — ED Notes (Signed)
Pt alert and oriented x 4. Stable gait. Pt given discharge papers/prescriptions. Pt told to stop by registration to complete any additional paperwork. Pt left the department with no further questions. 

## 2016-12-19 NOTE — ED Provider Notes (Signed)
[facility]-EMER[facility]ENCY DEPT Provider Note   CSN: [id number] Arrival date & time: 12/19/16  0256     History   Chief Complaint Chief Complaint  Patient presents with  . Chest Pain  . Shortness of Breath    HPI Donna Douglas is a 36 y.o. female.  The history is provided by the patient.  Chest Pain   This is a new problem. The current episode started more than 2 days ago. The problem occurs daily. The problem has been gradually worsening. The pain is associated with exertion. The pain is present in the substernal region. The pain is moderate. The quality of the pain is described as sharp. The pain does not radiate. Episode Length: "a few minutes" Associated symptoms include shortness of breath. Pertinent negatives include no abdominal pain, no back pain, no cough, no diaphoresis, no hemoptysis, no lower extremity edema, no syncope and no vomiting. She has tried nothing for the symptoms.  Her past medical history is significant for diabetes and PE.  Pertinent negatives for past medical history include no CAD.  Pertinent negatives for family medical history include: no CAD.  Shortness of Breath  Associated symptoms include chest pain. Pertinent negatives include no cough, no hemoptysis, no syncope, no vomiting and no abdominal pain. Associated medical issues include PE. Associated medical issues do not include CAD.  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 Asthma   . Diabetes mellitus without complication (HCC)   . DVT (deep venous thrombosis) (HCC)   . Pulmonary emboli (HCC)   . Sleep apnea   . Thyroid disease      Patient Active Problem List   Diagnosis Date Noted  . 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 DM (diabetes mellitus) (HCC) 03/23/2016  . Thyroid condition 03/23/2016  . Pulmonary emboli (HCC) 01/05/2016  . Bilateral pulmonary embolism (HCC) 01/05/2016    Past Surgical History:  Procedure Laterality Date  . IVC FILTER PLACEMENT ([facility] HX)      OB History    No data available       Home Medications    Prior to Admission medications   Medication Sig Start Date End Date Taking? Authorizing Provider  acetaminophen (TYLENOL) 500 MG tablet Take 1,000 mg by mouth every 6 (six) hours as needed for mild pain.   Yes Historical Provider, MD  ibuprofen (ADVIL,MOTRIN) 800 MG tablet Take 1 tablet (800 mg total) by mouth 3 (three) times daily. 03/14/16  Yes Marily Memos, MD  VENTOLIN HFA 108 (90 Base) MCG/ACT inhaler Inhale 2 puffs into the lungs every 4 (four) hours. 03/24/16  Yes Leroy Sea, MD  warfarin (COUMADIN) 10 MG tablet Take 10 mg by mouth daily.   Yes Historical Provider, MD  enoxaparin (LOVENOX) 120 MG/0.8ML injection Inject 0.8 mLs (120 mg total) into the skin every 12 (twelve) hours. Dispense one week supply as needed, no refills 03/24/16   Leroy Sea, MD    Family History History reviewed. No pertinent family history.  Social History Social History  Substance Use Topics  . Smoking  status: Current Every Day Smoker    Packs/day: 1.50    Types: Cigarettes  . Smokeless tobacco: Current User  . Alcohol use No     Allergies   Patient has no known allergies.   Review of Systems Review of Systems  Constitutional: Negative for diaphoresis.  Respiratory: Positive for shortness of breath. Negative for cough and hemoptysis.   Cardiovascular: Positive for chest pain. Negative for syncope.  Gastrointestinal: Negative for abdominal pain and vomiting.  Musculoskeletal: Negative for back pain.  Neurological: Negative for syncope.   All other systems reviewed and are negative.    Physical Exam Updated Vital Signs BP 100/61 (BP Location: Right Arm)   Pulse 80   Temp 98.1 F (36.7 C) (Oral)   Resp 18   Ht  (1.651 m)   Wt 125.2 kg   LMP 11/18/2016 (Approximate)   SpO2 98%   BMI 45.93 kg/m   Physical Exam  CONSTITUTIONAL: Well developed/well nourished, obese HEAD: Normocephalic/atraumatic EYES: EOMI/PERRL ENMT: Mucous membranes moist NECK: supple no meningeal signs SPINE/BACK:entire spine nontender CV: S1/S2 noted, no murmurs/rubs/gallops noted LUNGS: Lungs are clear to auscultation bilaterally, no apparent distress ABDOMEN: soft, nontender, no rebound or guarding, bowel sounds noted throughout abdomen GU:no cva tenderness NEURO: Pt is awake/alert/appropriate, moves all extremitiesx4.  No facial droop.   EXTREMITIES: pulses normal/equal, full ROM, no calf tenderness, no assymetric edema to legs SKIN: warm, color normal PSYCH: no abnormalities of mood noted, alert and oriented to situation  ED Treatments / Results  Labs (all labs ordered are listed, but only abnormal results are displayed) Labs Reviewed  BASIC METABOLIC PANEL - Abnormal; Notable for the following:       Result Value   Glucose, Bld 106 (*)    Calcium 8.7 (*)    All other components within normal limits  CBC - Abnormal; Notable for the following:    Hemoglobin 11.2 (*)    HCT 35.0 (*)    MCV 76.3 (*)    MCH 24.4 (*)    RDW 16.3 (*)    All other components within normal limits  PROTIME-INR - Abnormal; Notable for the following:    Prothrombin Time 24.1 (*)    All other components within normal limits  TROPONIN I  TROPONIN I  I-STAT BETA HCG BLOOD, ED ([facility], [facility], [facility] ONLY)    EKG  EKG Interpretation  Date/Time:  Saturday December 19 2016 03:05:53 EDT Ventricular Rate:  86 PR Interval:    QRS Duration: 84 QT Interval:  355 QTC Calculation: 425 R Axis:   63 Text Interpretation:  Sinus rhythm Abnormal R-wave progression,  early transition No significant change since last tracing Confirmed by Bebe Shaggy  MD, Salisa Broz ([id number]) on 12/19/2016 3:12:30 AM       Radiology Dg Chest 2 View  Result Date: 12/19/2016 CLINICAL DATA:  Chest pain and shortness of breath for 3 days EXAM: CHEST  2 VIEW COMPARISON:  CT chest 01/05/2016 FINDINGS: The heart size and mediastinal contours are within normal limits. Both lungs are clear. The visualized skeletal structures are unremarkable. IMPRESSION: No active cardiopulmonary disease. Electronically Signed   By: Burman Nieves M.D.   On: 12/19/2016 04:05   Ct Angio Chest Pe W And/or Wo Contrast  Result Date: 12/19/2016 CLINICAL DATA:  Intermittent chest pain and pressure for 3 days. EXAM: CT ANGIOGRAPHY CHEST WITH CONTRAST TECHNIQUE: Multidetector CT imaging of the chest was performed using the standard protocol during bolus administration of intravenous contrast. Multiplanar CT image reconstructions  and MIPs were obtained to evaluate the vascular anatomy. CONTRAST:  100 ml  ISOVUE-300 IOPAMIDOL (ISOVUE-300) INJECTION 61% COMPARISON:  01/05/2016 FINDINGS: Cardiovascular: Satisfactory opacification of the pulmonary arteries to the segmental level. No evidence of pulmonary embolism. Normal heart size. No pericardial effusion. Mediastinum/Nodes: No enlarged mediastinal, hilar, or axillary lymph nodes. Thyroid gland, trachea, and esophagus demonstrate no significant findings. Lungs/Pleura: Lungs are clear. No pleural effusion or pneumothorax. Upper Abdomen: No acute abnormality. Musculoskeletal: No chest wall abnormality. No acute or significant osseous findings. Review of the MIP images confirms the above findings. IMPRESSION: No evidence of significant pulmonary embolus. No evidence of active pulmonary disease. Electronically Signed   By: Burman Nieves M.D.   On: 12/19/2016 05:33    Procedures Procedures (including critical care time)  Medications Ordered in ED Medications  iopamidol  (ISOVUE-300) 61 % injection 100 mL ( Intravenous Canceled Entry 12/19/16 0452)  fentaNYL (SUBLIMAZE) injection 100 mcg (100 mcg Intravenous Given 12/19/16 0337)  ondansetron (ZOFRAN) injection 4 mg (4 mg Intravenous Given 12/19/16 0338)  fentaNYL (SUBLIMAZE) injection 25 mcg (25 mcg Intravenous Given 12/19/16 0423)  iopamidol (ISOVUE-370) 76 % injection 100 mL (100 mLs Intravenous Contrast Given 12/19/16 0452)  ketorolac (TORADOL) 30 MG/ML injection 30 mg (30 mg Intravenous Given 12/19/16 0604)     Initial Impression / Assessment and Plan / ED Course  I have reviewed the triage vital signs and the nursing notes.  Pertinent labs & imaging results that were available during my care of the patient were reviewed by me and considered in my medical decision making (see chart for details).     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 She is on coumadin and reports compliance but this has been issue in the past She is a smoker HEAR score = 2 Labs/imaging pending 4:12 AM Initial labs/imaging unrevealing Pt with complicated h/o recurrent PE, despite coumadin/IVC I feel CT imaging warranted to evaluate for any large clot burden or Right heart strain Pt agreeable 5:48 AM CT chest negative Pt still reporting CP Will give toradol Will perform repeat troponin If negative, pt will be discharged 6:50 AM Pt resting comfortably Repeat troponin negative BP 101/60   Pulse 83   Temp 98.1 F (36.7 C) (Oral)   Resp 15   Ht  (1.651 m)   Wt 125.2 kg   LMP 11/18/2016 (Approximate)   SpO2 95%   BMI 45.93 kg/m  I feel she is appropriate for d/c home We discussed return precautions Encouraged to continue coumadin She can f/u with her physicians as outpatient We discussed strict ER return precautions  Final Clinical Impressions(s) / ED Diagnoses   Final diagnoses:  Precordial pain    New Prescriptions New Prescriptions   No medications on file     Zadie Rhine,  MD 12/19/16 [phone]

## 2017-04-15 ENCOUNTER — Encounter (HOSPITAL_COMMUNITY): Payer: Self-pay

## 2017-04-15 ENCOUNTER — Observation Stay (HOSPITAL_COMMUNITY)
Admission: EM | Admit: 2017-04-15 | Discharge: 2017-04-17 | Disposition: A | Discharge status: Home / Self Care | Payer: Medicaid - Out of State | Attending: Internal Medicine | Admitting: Internal Medicine

## 2017-04-15 ENCOUNTER — Emergency Department (HOSPITAL_COMMUNITY): Payer: Medicaid - Out of State

## 2017-04-15 DIAGNOSIS — J45909 Unspecified asthma, uncomplicated: Secondary | ICD-10-CM | POA: Diagnosis not present

## 2017-04-15 DIAGNOSIS — I82402 Acute embolism and thrombosis of unspecified deep veins of left lower extremity: Secondary | ICD-10-CM | POA: Diagnosis present

## 2017-04-15 DIAGNOSIS — I2699 Other pulmonary embolism without acute cor pulmonale: Secondary | ICD-10-CM | POA: Diagnosis present

## 2017-04-15 DIAGNOSIS — R079 Chest pain, unspecified: Secondary | ICD-10-CM | POA: Diagnosis present

## 2017-04-15 DIAGNOSIS — D6859 Other primary thrombophilia: Secondary | ICD-10-CM

## 2017-04-15 DIAGNOSIS — E119 Type 2 diabetes mellitus without complications: Secondary | ICD-10-CM | POA: Diagnosis not present

## 2017-04-15 DIAGNOSIS — Z7901 Long term (current) use of anticoagulants: Secondary | ICD-10-CM | POA: Insufficient documentation

## 2017-04-15 DIAGNOSIS — Z3A10 10 weeks gestation of pregnancy: Secondary | ICD-10-CM | POA: Diagnosis not present

## 2017-04-15 DIAGNOSIS — F17210 Nicotine dependence, cigarettes, uncomplicated: Secondary | ICD-10-CM | POA: Diagnosis not present

## 2017-04-15 DIAGNOSIS — R091 Pleurisy: Secondary | ICD-10-CM | POA: Diagnosis not present

## 2017-04-15 DIAGNOSIS — Z3490 Encounter for supervision of normal pregnancy, unspecified, unspecified trimester: Secondary | ICD-10-CM

## 2017-04-15 DIAGNOSIS — E079 Disorder of thyroid, unspecified: Secondary | ICD-10-CM | POA: Diagnosis not present

## 2017-04-15 LAB — CBC
HCT: 33.4 % — ABNORMAL LOW (ref 36.0–46.0)
Hemoglobin: 10.9 g/dL — ABNORMAL LOW (ref 12.0–15.0)
MCH: 24.4 pg — ABNORMAL LOW (ref 26.0–34.0)
MCHC: 32.6 g/dL (ref 30.0–36.0)
MCV: 74.7 fL — ABNORMAL LOW (ref 78.0–100.0)
Platelets: 362 10*3/uL (ref 150–400)
RBC: 4.47 MIL/uL (ref 3.87–5.11)
RDW: 18.2 % — ABNORMAL HIGH (ref 11.5–15.5)
WBC: 6.4 10*3/uL (ref 4.0–10.5)

## 2017-04-15 LAB — BASIC METABOLIC PANEL
Anion gap: 6 (ref 5–15)
BUN: 9 mg/dL (ref 6–20)
CO2: 24 mmol/L (ref 22–32)
Calcium: 9 mg/dL (ref 8.9–10.3)
Chloride: 104 mmol/L (ref 101–111)
Creatinine, Ser: 0.78 mg/dL (ref 0.44–1.00)
GFR calc Af Amer: >60 mL/min (ref >60)
GFR calc non Af Amer: >60 mL/min (ref >60)
Glucose, Bld: 111 mg/dL — ABNORMAL HIGH (ref 65–99)
Potassium: 3.5 mmol/L (ref 3.5–5.1)
Sodium: 134 mmol/L — ABNORMAL LOW (ref 135–145)

## 2017-04-15 LAB — I-STAT TROPONIN, ED: Troponin i, poc: 0 ng/mL (ref 0.00–0.08)

## 2017-04-15 LAB — ABO/RH: ABO/RH(D): A POS

## 2017-04-15 NOTE — ED Triage Notes (Signed)
Pt reports chest pain and sob x 2-3 days with some dizziness as well.  Pt states she is approx 2 months pregnant and is to see her OB next week.

## 2017-04-16 ENCOUNTER — Emergency Department (HOSPITAL_COMMUNITY): Payer: Medicaid - Out of State

## 2017-04-16 ENCOUNTER — Observation Stay (HOSPITAL_COMMUNITY): Payer: Medicaid - Out of State

## 2017-04-16 DIAGNOSIS — R091 Pleurisy: Secondary | ICD-10-CM | POA: Diagnosis not present

## 2017-04-16 DIAGNOSIS — Z3490 Encounter for supervision of normal pregnancy, unspecified, unspecified trimester: Secondary | ICD-10-CM

## 2017-04-16 DIAGNOSIS — R072 Precordial pain: Secondary | ICD-10-CM

## 2017-04-16 DIAGNOSIS — R079 Chest pain, unspecified: Secondary | ICD-10-CM | POA: Diagnosis present

## 2017-04-16 DIAGNOSIS — I2699 Other pulmonary embolism without acute cor pulmonale: Secondary | ICD-10-CM

## 2017-04-16 DIAGNOSIS — I82402 Acute embolism and thrombosis of unspecified deep veins of left lower extremity: Secondary | ICD-10-CM | POA: Diagnosis not present

## 2017-04-16 DIAGNOSIS — D6859 Other primary thrombophilia: Secondary | ICD-10-CM | POA: Diagnosis not present

## 2017-04-16 DIAGNOSIS — Z3A10 10 weeks gestation of pregnancy: Secondary | ICD-10-CM | POA: Diagnosis not present

## 2017-04-16 LAB — GLUCOSE, CAPILLARY: Glucose-Capillary: 105 mg/dL — ABNORMAL HIGH (ref 65–99)

## 2017-04-16 LAB — PROTIME-INR
INR: 1.02
Prothrombin Time: 13.4 seconds (ref 11.4–15.2)

## 2017-04-16 LAB — HCG, QUANTITATIVE, PREGNANCY: hCG, Beta Chain, Quant, S: 153681 m[IU]/mL — ABNORMAL HIGH (ref <5)

## 2017-04-16 LAB — TSH: TSH: 1.554 u[IU]/mL (ref 0.350–4.500)

## 2017-04-16 LAB — APTT: aPTT: 30 seconds (ref 24–36)

## 2017-04-16 LAB — TROPONIN I
Troponin I: <0.03 ng/mL (ref <0.03)
Troponin I: <0.03 ng/mL (ref <0.03)

## 2017-04-16 LAB — T4, FREE: Free T4: 0.9 ng/dL (ref 0.61–1.12)

## 2017-04-16 MED ORDER — ALBUTEROL SULFATE (2.5 MG/3ML) 0.083% IN NEBU
5.0000 mg | INHALATION_SOLUTION | Freq: Once | RESPIRATORY_TRACT | Status: AC
  Administered 2017-04-16: 5 mg via RESPIRATORY_TRACT
  Filled 2017-04-16: qty 6

## 2017-04-16 MED ORDER — SODIUM CHLORIDE 0.9 % IV BOLUS (SEPSIS)
1000.0000 mL | Freq: Once | INTRAVENOUS | Status: AC
  Administered 2017-04-16: 1000 mL via INTRAVENOUS

## 2017-04-16 MED ORDER — ALBUTEROL SULFATE (2.5 MG/3ML) 0.083% IN NEBU
2.5000 mg | INHALATION_SOLUTION | RESPIRATORY_TRACT | Status: DC
  Administered 2017-04-16 – 2017-04-17 (×6): 2.5 mg via RESPIRATORY_TRACT
  Filled 2017-04-16 (×6): qty 3

## 2017-04-16 MED ORDER — SODIUM CHLORIDE 0.9% FLUSH
3.0000 mL | Freq: Two times a day (BID) | INTRAVENOUS | Status: DC
  Administered 2017-04-16 – 2017-04-17 (×4): 3 mL via INTRAVENOUS

## 2017-04-16 MED ORDER — ALBUTEROL SULFATE HFA 108 (90 BASE) MCG/ACT IN AERS
2.0000 | INHALATION_SPRAY | RESPIRATORY_TRACT | Status: DC
  Administered 2017-04-16 (×3): 2 via RESPIRATORY_TRACT
  Filled 2017-04-16: qty 6.7

## 2017-04-16 MED ORDER — HYDROCODONE-ACETAMINOPHEN 5-325 MG PO TABS
1.0000 | ORAL_TABLET | Freq: Once | ORAL | Status: AC
  Administered 2017-04-16: 1 via ORAL
  Filled 2017-04-16: qty 1

## 2017-04-16 MED ORDER — ENOXAPARIN SODIUM 150 MG/ML ~~LOC~~ SOLN
1.0000 mg/kg | Freq: Two times a day (BID) | SUBCUTANEOUS | Status: DC
  Administered 2017-04-16 – 2017-04-17 (×3): 130 mg via SUBCUTANEOUS
  Filled 2017-04-16: qty 1
  Filled 2017-04-16 (×6): qty 0.85

## 2017-04-16 MED ORDER — FENTANYL CITRATE (PF) 100 MCG/2ML IJ SOLN
25.0000 ug | INTRAMUSCULAR | Status: DC | PRN
  Administered 2017-04-16 – 2017-04-17 (×7): 25 ug via INTRAVENOUS
  Filled 2017-04-16 (×7): qty 2

## 2017-04-16 NOTE — Progress Notes (Signed)
PROGRESS NOTE  Donna Douglas KDX:[id number] DOB: Jan 10, 1981 DOA: 04/15/2017 PCP: System, Provider Not In  Brief History:  36 year old female with a history of protein C and protein S deficiency, recurrent PE with IVC filter, tobacco abuse presented with 2-3 day history of chest pain and shortness of breath. The patient states that she was diagnosed with an intrauterine pregnancy when she went to urgent care at the end of July 2018. The patient had been previously on warfarin. She stated that she restarted her Lovenox from a leftover supply that she had at home after she was told she was pregnant. She assures me that the expiration date on her Lovenox was sometime in 2019.  Nevertheless, the patient states that she has not been very compliant with her Lovenox. She states that she only uses it 2-3 days per week. She denies any fevers, chills, headache, neck pain, nausea, vomiting, diarrhea. The first day of her last menstrual cycle was 02/05/2017. In the emergency room, the patient was afebrile and on stable saturating 97% on room air. Because of high suspicion for pulmonary embolus, the patient was made for observation.  Assessment/Plan: Chest pain with history of pulmonary embolus -Concern about/high suspicion for recurrent PE in the setting of poor compliance with enoxaparin -I have discussed the risks, benefits, and alternatives of repeat CT angiogram of the chest in the setting of intrauterine pregnancy--we mutually agreed to avoid further radiographic studies at this time as this would not change her course of therapy -Cycle troponins -Echocardiogram -Personally reviewed EKG--sinus rhythm, no ST-T wave changes -continue enoxaparin -discussed importance of compliance -03/23/2016 CT angiogram chest--bilateral PE in the lumbar and segmental pulmonary arteries -12/19/2016 CT angiogram chest--negative pulmonary embolus  Intrauterine pregnancy -The patient has OB/GYN appointment at  [facility] on 04/22/17 -Dr. Emelda Fear consulted  Protein C and protein S deficiency -Interestingly, the patient states that she has never followed up with a hematologist -noted low levels from blood work at [facility] on 03/07/06  Hyperglycemia -check A1C  Tobacco abuse -Patient quit smoking 2 weeks prior to admission   Disposition Plan:   Home 8/18 if stable Family Communication:   Spouse updated at bedside 8/17--Total time spent 35 minutes.  Greater than 50% spent face to face counseling and coordinating care. 9767 to 0840   Consultants:  GYN--Dr. Emelda Fear by EDP  Code Status:  FULL  DVT Prophylaxis:  [place] Lovenox   Procedures: As Listed in Progress Note Above  Antibiotics: None    Subjective: The patient feels that her chest pain is improving. She denies any shortness of breath. Denies any fevers, chills, nausea, vomiting, diarrhea, abdominal pain. No dysuria or hematuria. No rashes.  Objective: Vitals:   04/16/17 0500 04/16/17 0530 04/16/17 0600 04/16/17 0728  BP: 120/85 116/68 100/62 104/70  Pulse: 74 76 79 95  Resp: (!) 21 14 (!) 23 (!) 23  Temp:      TempSrc:      SpO2: 100% 95% 97% 97%  Weight:      Height:        Intake/Output Summary (Last 24 hours) at 04/16/17 0828 Last data filed at 04/16/17 0412  Gross per 24 hour  Intake             1000 ml  Output                0 ml  Net  1000 ml   Weight change:  Exam:   General:  Pt is alert, follows commands appropriately, not in acute distress  HEENT: No icterus, No thrush, No neck mass, [place]/AT  Cardiovascular: RRR, S1/S2, no rubs, no gallops  Respiratory: Fine bibasilar crackles. No wheeze. Good air movement.  Abdomen: Soft/+BS, non tender, non distended, no guarding  Extremities: No edema, No lymphangitis, No petechiae, No rashes, no synovitis   Data Reviewed: I have personally reviewed following labs and imaging studies Basic Metabolic Panel:  Recent Labs Lab 04/15/17 2331  NA  134*  K 3.5  CL 104  CO2 24  GLUCOSE 111*  BUN 9  CREATININE 0.78  CALCIUM 9.0   Liver Function Tests: No results for input(s): AST, ALT, ALKPHOS, BILITOT, PROT, ALBUMIN in the last 168 hours. No results for input(s): LIPASE, AMYLASE in the last 168 hours. No results for input(s): AMMONIA in the last 168 hours. Coagulation Profile:  Recent Labs Lab 04/15/17 2331  INR 1.02   CBC:  Recent Labs Lab 04/15/17 2331  WBC 6.4  HGB 10.9*  HCT 33.4*  MCV 74.7*  PLT 362   Cardiac Enzymes: No results for input(s): CKTOTAL, CKMB, CKMBINDEX, TROPONINI in the last 168 hours. BNP: Invalid input(s): POCBNP CBG: No results for input(s): GLUCAP in the last 168 hours. HbA1C: No results for input(s): HGBA1C in the last 72 hours. Urine analysis: No results found for: COLORURINE, APPEARANCEUR, LABSPEC, PHURINE, GLUCOSEU, HGBUR, BILIRUBINUR, KETONESUR, PROTEINUR, UROBILINOGEN, NITRITE, LEUKOCYTESUR Sepsis Labs: @LABRCNTIP (procalcitonin:4,lacticidven:4) )No results found for this or any previous visit (from the past 240 hour(s)).   Scheduled Meds: . albuterol  2 puff Inhalation Q4H  . enoxaparin (LOVENOX) injection  1 mg/kg Subcutaneous Q12H  . sodium chloride flush  3 mL Intravenous Q12H   Continuous Infusions:  Procedures/Studies: Dg Chest 1 View  Result Date: 04/16/2017 CLINICAL DATA:  Mid chest pain and shortness of breath for few days. History of asthma, pulmonary embolism. EXAM: CHEST 1 VIEW COMPARISON:  CT chest December 19, 2016 FINDINGS: Cardiomediastinal silhouette is unremarkable for this low inspiratory examination with crowded vasculature markings. The lungs are clear without pleural effusions or focal consolidations. Mild bronchitic changes. Trachea projects midline and there is no pneumothorax. Included soft tissue planes and osseous structures are non-suspicious. IMPRESSION: Mild bronchitic changes, no focal consolidation. Electronically Signed   By: Awilda Metro M.D.    On: 04/16/2017 04:06    Barry Faircloth, DO  [facility] Pager [phone]  If 7PM-7AM, please contact night-coverage [email or web address] Password [email or web address] 04/16/2017, 8:28 AM   LOS: 0 days

## 2017-04-16 NOTE — ED Notes (Signed)
Patient ambulated to restroom, patient states that she felt very weak and dizzy. Heart rate increased to 130, respirations 30-32. Patient had to be returned to room via wheelchair. EDP Dr Bebe Shaggy is in room with patient, made him aware of patients status.

## 2017-04-16 NOTE — ED Notes (Signed)
Hospitalist at bedside 

## 2017-04-16 NOTE — ED Notes (Signed)
Pt given orange juice.

## 2017-04-16 NOTE — ED Provider Notes (Signed)
[facility] DEPT Provider Note   CSN: [id number] Arrival date & time: 04/15/17  2257     History   Chief Complaint Chief Complaint  Patient presents with  . Chest Pain    HPI Donna Douglas is a 36 y.o. female.  The history is provided by the patient.  Chest Pain   This is a new problem. The current episode started 2 days ago. The problem occurs daily. The problem has not changed since onset.The pain is present in the substernal region. The pain is moderate. The quality of the pain is described as sharp and pleuritic. The pain does not radiate. The symptoms are aggravated by deep breathing. Associated symptoms include dizziness and shortness of breath. Pertinent negatives include no abdominal pain, no back pain, no cough, no fever, no headaches, no hemoptysis, no syncope and no vomiting.  Her past medical history is significant for PE.  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 Asthma   . Diabetes mellitus without complication (HCC)   . DVT (deep venous thrombosis) (HCC)   . Pulmonary emboli (HCC)   . Sleep apnea   . Thyroid disease     Patient Active Problem List   Diagnosis Date Noted  . 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 DM (diabetes mellitus) (HCC) 03/23/2016  . Thyroid condition 03/23/2016  . Pulmonary emboli (HCC) 01/05/2016  . Bilateral pulmonary embolism (HCC) 01/05/2016    Past Surgical History:    Procedure Laterality Date  . IVC FILTER PLACEMENT ([facility] HX)      OB History    Gravida Para Term Preterm AB Living   1             SAB TAB Ectopic Multiple Live Births                   Home Medications    Prior to Admission medications   Medication Sig Start Date End Date Taking? Authorizing Provider  acetaminophen (TYLENOL) 500 MG tablet Take 1,000 mg by mouth every 6 (six) hours as needed for mild pain.    [provider]  ibuprofen (ADVIL,MOTRIN) 800 MG tablet Take 1 tablet (800 mg total) by mouth 3 (three) times daily. 03/14/16   Mesner, Barbara Cower, MD  VENTOLIN HFA 108 (90 Base) MCG/ACT inhaler Inhale 2 puffs into the lungs every 4 (four) hours. 03/24/16   Leroy Sea, MD  warfarin (COUMADIN) 10 MG tablet Take 10 mg by mouth daily.    [provider]    Family History No family history on file.  Social History Social History  Substance Use Topics  . Smoking status: Current Every Day Smoker    Packs/day: 1.50    Types: Cigarettes  . Smokeless tobacco: Current User  . Alcohol use No     Allergies   Patient has no known allergies.   Review of Systems Review of Systems  Constitutional: Negative for fever.  Respiratory: Positive for shortness of breath. Negative for cough and hemoptysis.   Cardiovascular: Positive for chest pain. Negative for syncope.  Gastrointestinal: Negative for abdominal pain and vomiting.  Genitourinary: Negative for vaginal bleeding.  Musculoskeletal: Negative for back pain.  Neurological: Positive for dizziness. Negative for syncope and headaches.  All other systems reviewed and are negative.    Physical Exam Updated Vital Signs BP 106/62   Pulse 95   Temp 98.7 F (37.1 C) (Oral)   Resp 19   Ht 1.651 m (5\' 5" )   Wt 127 kg (280 lb)   LMP 02/05/2017   SpO2 98%   BMI 46.59 kg/m   Physical Exam  CONSTITUTIONAL: Well developed/well nourished HEAD: Normocephalic/atraumatic EYES: EOMI/PERRL ENMT: Mucous  membranes moist NECK: supple no meningeal signs SPINE/BACK:entire spine nontender CV: S1/S2 noted, no murmurs/rubs/gallops noted Chest - mild tenderness to left upper chest LUNGS: Lungs are clear to auscultation bilaterally, no apparent distress ABDOMEN: soft, nontender, no rebound or guarding, bowel sounds noted throughout abdomen GU:no cva tenderness NEURO: Pt is awake/alert/appropriate, moves all extremitiesx4.  No facial droop.   EXTREMITIES: pulses normal/equal, full ROM, no LE edema or tenderness SKIN: warm, color normal PSYCH: no abnormalities of mood noted, alert and oriented to situation  ED Treatments / Results  Labs (all labs ordered are listed, but only abnormal results are displayed) Labs Reviewed  BASIC METABOLIC PANEL - Abnormal; Notable for the following:       Result Value   Sodium 134 (*)    Glucose, Bld 111 (*)    All other components within normal limits  CBC - Abnormal; Notable for the following:    Hemoglobin 10.9 (*)    HCT 33.4 (*)    MCV 74.7 (*)    MCH 24.4 (*)    RDW 18.2 (*)    All other components within normal limits  HCG, QUANTITATIVE, PREGNANCY - Abnormal; Notable for the following:    hCG, Beta Chain, Quant, [place] 098,119 (*)    All other components within normal limits  PROTIME-INR  APTT  HIV ANTIBODY (ROUTINE TESTING)  I-STAT TROPONIN, ED  ABO/RH    EKG  EKG Interpretation  Date/Time:  Thursday April 15 2017 23:06:18 EDT Ventricular Rate:  92 PR Interval:    QRS Duration: 80 QT Interval:  325 QTC Calculation: 402 R Axis:   45 Text Interpretation:  Sinus rhythm Baseline wander in lead(s) V2 No significant change since last tracing Confirmed by Zadie Rhine ([id number]) on 04/15/2017 11:37:48 PM       Radiology Dg Chest 1 View  Result Date: 04/16/2017 CLINICAL DATA:  Mid chest pain and shortness of breath for few days. History of asthma, pulmonary embolism. EXAM: CHEST 1 VIEW COMPARISON:  CT chest December 19, 2016 FINDINGS:  Cardiomediastinal silhouette is unremarkable for this low inspiratory examination with crowded vasculature markings. The lungs are clear without pleural effusions or focal consolidations. Mild bronchitic changes. Trachea projects midline and there is no pneumothorax. Included soft tissue planes and osseous structures are non-suspicious. IMPRESSION: Mild bronchitic changes, no focal consolidation. Electronically Signed   By: Awilda Metro M.D.   On: 04/16/2017 04:06    Procedures Procedures (including critical care time)  Medications Ordered in ED Medications  albuterol (PROVENTIL HFA;VENTOLIN HFA) 108 (90 Base) MCG/ACT inhaler 2 puff (not administered)  sodium chloride flush (NS) 0.9 % injection 3 mL (not administered)  fentaNYL (SUBLIMAZE) injection 25 mcg (not administered)  enoxaparin (LOVENOX) injection 130 mg (  not administered)  albuterol (PROVENTIL) (2.5 MG/3ML) 0.083% nebulizer solution 5 mg (5 mg Nebulization Given 04/16/17 0059)  HYDROcodone-acetaminophen (NORCO/VICODIN) 5-325 MG per tablet 1 tablet (1 tablet Oral Given 04/16/17 0144)  sodium chloride 0.9 % bolus 1,000 mL (0 mLs Intravenous Stopped 04/16/17 0412)  HYDROcodone-acetaminophen (NORCO/VICODIN) 5-325 MG per tablet 1 tablet (1 tablet Oral Given 04/16/17 0427)     Initial Impression / Assessment and Plan / ED Course  I have reviewed the triage vital signs and the nursing notes.  Pertinent labs & imaging results that were available during my care of the patient were reviewed by me and considered in my medical decision making (see chart for details).     2:32 AM Pt with previous h/o VTE, has filter in place, is supposed to be on lifelong anticoagulation She thinks she has recurrent PE However she is not compliant with meds She is well appearing and no distress Doubt ACS/Dissection Defer CXR as no added lung sounds but she requested albuterol She has had multiple CT chest  Scans in past, most recently April 2018 that  was negative 5:20 AM Pt had tachycardia/tachypnea on ambulation CXR ordered High concern for acute PE Due to pregnancy and multiple CT scans in past, I feel that admission, treatment for PE and monitoring is best rather than obtaining CT in the ED D/w dr Houston Siren, he will admit patient She will need formal OB consult and 1st trimester [place] in the AM However she has no abd pain/vag bleeding at this time suggest ectopic  Pt stabilized in the ER   Final Clinical Impressions(s) / ED Diagnoses   Final diagnoses:  Pleurisy  [redacted] weeks gestation of pregnancy    New Prescriptions New Prescriptions   No medications on file     Zadie Rhine, MD 04/16/17 [phone]

## 2017-04-16 NOTE — Progress Notes (Signed)
Patient c/o chest pain B/P 117/63,hr 65,respiration 18. Dr Tat notified. Will continue to monitor patient.

## 2017-04-16 NOTE — H&P (Signed)
History and Physical    Donna Douglas TWS:[id number] DOB: Mar 18, 1981 DOA: 04/15/2017  PCP: System, Provider Not In  Patient coming from: Home.    Chief Complaint:  Chest pain and SOB for three days.   HPI: Donna Douglas is an 36 y.o. 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 Protein C deficiency, s/p IVC filter placement several years ago,  on life time anticoagulation, previously with Warfarin until 2 months ago, converted to Lovenox as she became pregnant, being non compliant with it, presents to the ER with acute onset of suprasternal chest pain, and SOB.  She denied fever, chills, nausea or vomiting.  CXR showed brochitic changes only, but no PNA, negative troponins.  She was stable at rest, with 97 percent Sat, but get SOB and tachycardia with ambulation.  Hospitalist was asked to admit her for further management.   ED Course:  See above.  Rewiew of Systems:  Constitutional: Negative for malaise, fever and chills. No significant weight loss or weight gain Eyes: Negative for eye pain, redness and discharge, diplopia, visual changes, or flashes of light. ENMT: Negative for ear pain, hoarseness, nasal congestion, sinus pressure and sore throat. No headaches; tinnitus, drooling, or problem swallowing. Cardiovascular: Negative for palpitations, diaphoresis, and peripheral edema. ; No orthopnea, PND Respiratory: Negative for cough, hemoptysis, wheezing and stridor. No pleuritic chestpain. Gastrointestinal: Negative for diarrhea, constipation,  melena, blood in stool, hematemesis, jaundice and rectal bleeding.    Genitourinary: Negative for frequency, dysuria, incontinence,flank pain and hematuria; Musculoskeletal: Negative for back pain and neck pain. Negative for swelling and trauma.;  Skin: . Negative for pruritus, rash, abrasions, bruising and skin lesion.; ulcerations Neuro: Negative for headache, lightheadedness and neck stiffness. Negative for weakness, altered level  of consciousness , altered mental status, extremity weakness, burning feet, involuntary movement, seizure and syncope.  Psych: negative for anxiety, depression, insomnia, tearfulness, panic attacks, hallucinations, paranoia, suicidal or homicidal ideation    Past Medical History:  Diagnosis Date  . Asthma   . Diabetes mellitus without complication (HCC)   . DVT (deep venous thrombosis) (HCC)   . Pulmonary emboli (HCC)   . Sleep apnea   . Thyroid disease     Past Surgical History:  Procedure Laterality Date  . IVC FILTER PLACEMENT ([facility] HX)       reports that she has been smoking Cigarettes.  She has been smoking about 1.50 packs per day. She uses smokeless tobacco. She reports that she uses drugs, including Marijuana. She reports that she does not drink alcohol.  No Known Allergies  No family history on file.   Prior to Admission medications   Medication Sig Start Date End Date Taking? Authorizing Provider  acetaminophen (TYLENOL) 500 MG tablet Take 1,000 mg by mouth every 6 (six) hours as needed for mild pain.    [provider]  ibuprofen (ADVIL,MOTRIN) 800 MG tablet Take 1 tablet (800 mg total) by mouth 3 (three) times daily. 03/14/16   Mesner, Barbara Cower, MD  VENTOLIN HFA 108 (90 Base) MCG/ACT inhaler Inhale 2 puffs into the lungs every 4 (four) hours. 03/24/16   Leroy Sea, MD  warfarin (COUMADIN) 10 MG tablet Take 10 mg by mouth daily.    [provider]    Physical Exam: Vitals:   04/16/17 0230 04/16/17 0300 04/16/17 0430 04/16/17 0500  BP: (!) 123/56 115/71 105/70 120/85  Pulse: 89 86 78 74  Resp: 17 10 (!) 27 (!) 21  Temp:  TempSrc:      SpO2: 97% 98% 97% 100%  Weight:      Height:        Constitutional: NAD, calm, comfortable Vitals:   04/16/17 0230 04/16/17 0300 04/16/17 0430 04/16/17 0500  BP: (!) 123/56 115/71 105/70 120/85  Pulse: 89 86 78 74  Resp: 17 10 (!) 27 (!) 21  Temp:      TempSrc:      SpO2: 97% 98% 97% 100%  Weight:       Height:       Eyes: PERRL, lids and conjunctivae normal ENMT: Mucous membranes are moist. Posterior pharynx clear of any exudate or lesions.Normal dentition.  Neck: normal, supple, no masses, no thyromegaly Respiratory: clear to auscultation bilaterally, no wheezing, no crackles. Normal respiratory effort. No accessory muscle use.  Cardiovascular: Regular rate and rhythm, no murmurs / rubs / gallops. No extremity edema. 2+ pedal pulses. No carotid bruits.  Abdomen: no tenderness, no masses palpated. No hepatosplenomegaly. Bowel sounds positive.  Musculoskeletal: no clubbing / cyanosis. No joint deformity upper and lower extremities. Good ROM, no contractures. Normal muscle tone.  Skin: no rashes, lesions, ulcers. No induration Neurologic: CN 2-12 grossly intact. Sensation intact, DTR normal. Strength 5/5 in all 4.  Psychiatric: Normal judgment and insight. Alert and oriented x 3. Normal mood.    Labs on Admission: I have personally reviewed following labs and imaging studies  CBC:  Recent Labs Lab 04/15/17 2331  WBC 6.4  HGB 10.9*  HCT 33.4*  MCV 74.7*  PLT 362   Basic Metabolic Panel:  Recent Labs Lab 04/15/17 2331  NA 134*  K 3.5  CL 104  CO2 24  GLUCOSE 111*  BUN 9  CREATININE 0.78  CALCIUM 9.0   GFR: Coagulation Profile:  Recent Labs Lab 04/15/17 2331  INR 1.02   Radiological Exams on Admission: Dg Chest 1 View  Result Date: 04/16/2017 CLINICAL DATA:  Mid chest pain and shortness of breath for few days. History of asthma, pulmonary embolism. EXAM: CHEST 1 VIEW COMPARISON:  CT chest December 19, 2016 FINDINGS: Cardiomediastinal silhouette is unremarkable for this low inspiratory examination with crowded vasculature markings. The lungs are clear without pleural effusions or focal consolidations. Mild bronchitic changes. Trachea projects midline and there is no pneumothorax. Included soft tissue planes and osseous structures are non-suspicious. IMPRESSION: Mild  bronchitic changes, no focal consolidation. Electronically Signed   By: Awilda Metro M.D.   On: 04/16/2017 04:06    EKG: Independently reviewed.   Assessment/Plan Principal Problem:   Chest pain Active Problems:   Pulmonary emboli (HCC)   Deep vein thrombosis (DVT) of left lower extremity (HCC)   DM (diabetes mellitus) (HCC)   Thyroid condition    PLAN:   Chest pain:  She was given the option of getting imaging with CTA, but being in her first trimester pregnancy, the radiation exposure of CTA would be equivalent to about 200 chest xrays.  Given no change in therapy, she chose to not getting the CTA.  Will resume her Lovenox at 1mg  per kg BID SQ, and I encouraged her to be compliant with this.  Note she has an IVC filter placed already several years ago.  Negative troponin is an excellent prognostic indicator for her PE.   Will give low dose Fentanyl.   First Trimester pregnancy:  She is estimated to be about 2 months gestation.  Will consult inpatient OB GYN.    Hx of hypothyroidism:  Will check TSH.  She was noted not to be on supplement.   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  She was told that the daughter needs thrombophilic work up and probably should avoid oral contraceptives, or their risk of getting thromboembolism would be enormous.    DVT prophylaxis: Lovenox.  Code Status: FULL CODE.  Family Communication: Son at bedside.  Disposition Plan: Home.  Consults called: GYN Dr Emelda Fear by EDP.  Admission status: OBS.    Aleatha Taite MD FACP. [facility]  If 7PM-7AM, please contact night-coverage [email or web address] Password [email or web address]  04/16/2017, 5:13 AM

## 2017-04-16 NOTE — ED Notes (Signed)
Pt ambulatory to bathroom with assist.  No distress and tol well.  Pt requesting Benadryl for itching.  Primary RN made aware.

## 2017-04-17 ENCOUNTER — Observation Stay (HOSPITAL_BASED_OUTPATIENT_CLINIC_OR_DEPARTMENT_OTHER): Payer: Medicaid - Out of State

## 2017-04-17 DIAGNOSIS — R072 Precordial pain: Secondary | ICD-10-CM | POA: Diagnosis not present

## 2017-04-17 DIAGNOSIS — Z3A10 10 weeks gestation of pregnancy: Secondary | ICD-10-CM

## 2017-04-17 DIAGNOSIS — R091 Pleurisy: Secondary | ICD-10-CM

## 2017-04-17 DIAGNOSIS — D6859 Other primary thrombophilia: Secondary | ICD-10-CM | POA: Diagnosis not present

## 2017-04-17 LAB — GLUCOSE, CAPILLARY
Glucose-Capillary: 140 mg/dL — ABNORMAL HIGH (ref 65–99)
Glucose-Capillary: 102 mg/dL — ABNORMAL HIGH (ref 65–99)

## 2017-04-17 LAB — HIV ANTIBODY (ROUTINE TESTING W REFLEX): HIV Screen 4th Generation wRfx: NONREACTIVE

## 2017-04-17 LAB — CBC
HCT: 31.7 % — ABNORMAL LOW (ref 36.0–46.0)
Hemoglobin: 10.3 g/dL — ABNORMAL LOW (ref 12.0–15.0)
MCH: 24.6 pg — ABNORMAL LOW (ref 26.0–34.0)
MCHC: 32.5 g/dL (ref 30.0–36.0)
MCV: 75.7 fL — ABNORMAL LOW (ref 78.0–100.0)
Platelets: 335 10*3/uL (ref 150–400)
RBC: 4.19 MIL/uL (ref 3.87–5.11)
RDW: 18.6 % — ABNORMAL HIGH (ref 11.5–15.5)
WBC: 5.9 10*3/uL (ref 4.0–10.5)

## 2017-04-17 LAB — ECHOCARDIOGRAM COMPLETE
Height: 65 in
Weight: 4480 oz

## 2017-04-17 LAB — HEMOGLOBIN A1C
Hgb A1c MFr Bld: 5.6 % (ref 4.8–5.6)
Mean Plasma Glucose: 114.02 mg/dL

## 2017-04-17 MED ORDER — ENOXAPARIN SODIUM 120 MG/0.8ML ~~LOC~~ SOLN
120.0000 mg | Freq: Two times a day (BID) | SUBCUTANEOUS | Status: DC
  Filled 2017-04-17 (×4): qty 0.8

## 2017-04-17 MED ORDER — ALBUTEROL SULFATE (2.5 MG/3ML) 0.083% IN NEBU: 2.5000 mg | INHALATION_SOLUTION | RESPIRATORY_TRACT | Status: DC | PRN

## 2017-04-17 MED ORDER — HYDROCODONE-ACETAMINOPHEN 5-325 MG PO TABS: 1.0000 | ORAL_TABLET | ORAL | 0 refills | Status: DC | PRN

## 2017-04-17 MED ORDER — ENOXAPARIN SODIUM 120 MG/0.8ML ~~LOC~~ SOLN: 120.0000 mg | Freq: Two times a day (BID) | SUBCUTANEOUS | 0 refills | Status: DC

## 2017-04-17 MED ORDER — ENOXAPARIN (LOVENOX) PATIENT EDUCATION KIT
PACK | Freq: Once | Status: DC
  Filled 2017-04-17: qty 1

## 2017-04-17 MED ORDER — ALBUTEROL SULFATE HFA 108 (90 BASE) MCG/ACT IN AERS: 2.0000 | INHALATION_SPRAY | Freq: Four times a day (QID) | RESPIRATORY_TRACT | 0 refills | Status: DC | PRN

## 2017-04-17 NOTE — Progress Notes (Signed)
*  PRELIMINARY RESULTS* Echocardiogram 2D Echocardiogram has been performed.  Donna Douglas 04/17/2017, 10:12 AM

## 2017-04-17 NOTE — Progress Notes (Signed)
IV discontinued catheter intact.Discharge instructions given on medications and follow up visits,patient verbalized understanding .Precsriptions sent with patient. Staff accompanied patient to an awaiting vehicle.

## 2017-04-17 NOTE — Discharge Summary (Addendum)
Physician Discharge Summary  Donna Douglas JXB:[id number] DOB: 1980-12-20 DOA: 04/15/2017  PCP: System, Provider Not In  Admit date: 04/15/2017 Discharge date: 04/17/2017  Admitted From: Home Disposition:  Home   Recommendations for Outpatient Follow-up:  1. Follow up with PCP in 1-2 weeks 2. Please obtain BMP/CBC in one week 3. Follow up with Dr. Tilda Burrow in one week     Discharge Condition: Stable CODE STATUS:FULL Diet recommendation: Heart Healthy   Brief/Interim Summary: 36 year old female with a history of protein C and protein S deficiency, recurrent PE with IVC filter, tobacco abuse presented with 2-3 day history of chest pain and shortness of breath. The patient states that she was diagnosed with an intrauterine pregnancy when she went to urgent care at the end of July 2018. The patient had been previously on warfarin. She stated that she restarted her Lovenox from a leftover supply that she had at home after she was told she was pregnant. She assures me that the expiration date on her Lovenox was sometime in 2019.  Nevertheless, the patient states that she has not been very compliant with her Lovenox. She states that she only uses it 2-3 days per week. She denies any fevers, chills, headache, neck pain, nausea, vomiting, diarrhea. The first day of her last menstrual cycle was 02/05/2017. In the emergency room, the patient was afebrile and on stable saturating 97% on room air. Because of high suspicion for pulmonary embolus, the patient was made for observation. Throughout the entire hospitalization, the patient remained afebrile and hemodynamically stable without any hypoxemia or tachycardia.  Discharge Diagnoses:   Chest pain with history of pulmonary embolus -Concern about/high suspicion for recurrent PE in the setting of poor compliance with enoxaparin -I have discussed the risks, benefits, and alternatives of repeat CT angiogram of the chest in the setting of  intrauterine pregnancy--we mutually agreed to avoid further radiographic studies at this time as this would not change her course of therapy -Cycle troponins--neg x 3 -Personally reviewed EKG--sinus rhythm, no ST-T wave changes -04/17/17-Echocardiogram--EF 55-60 percent, no WMA, trivial TR, normal RV contractility  -Personally reviewed EKG--sinus rhythm, no ST-T wave changes -continue enoxaparin  -discussed importance of compliance -03/23/2016 CT angiogram chest--bilateral PE in the lumbar and segmental pulmonary arteries -12/19/2016 CT angiogram chest--negative pulmonary embolus -Rx Norco 5/325, #20, no RF---The [place] Controlled Substance Reporting System has been queried for this patient for the past 12 months prior to prescribing any opioids.  Intrauterine pregnancy -The patient states she has OB/GYN appointment at [facility] on 04/22/17 -Dr. Rondell Reams discussed with him--he graciously agree to see her as a patient in clinic if patient desires -04/16/2018 -Abd US--9 week 3 day IUP, viable; small subchorionic hemorrhage -04/17/2017--case discussed with Dr. Emelda Fear who felt pt could be discharged from [facility] standpoint to continue enoxaparin BID  Protein C and protein S deficiency -Interestingly, the patient states that she has never followed up with a hematologist -noted low levels from blood work at [facility] on 03/07/06  Hyperglycemia -check A1C--pending at time of discharge  Tobacco abuse -Patient quit smoking 2 weeks prior to admission  Discharge Instructions   Allergies as of 04/17/2017   No Known Allergies     Medication List    STOP taking these medications   enoxaparin 30 MG/0.3ML injection Commonly known as:  LOVENOX Replaced by:  enoxaparin 120 MG/0.8ML injection     TAKE these medications   acetaminophen 500 MG tablet Commonly known as:  TYLENOL Take 1,000 mg  by mouth every 6 (six) hours as needed for mild pain.   albuterol 108 (90 Base)  MCG/ACT inhaler Commonly known as:  VENTOLIN HFA Inhale 2 puffs into the lungs every 6 (six) hours as needed for wheezing or shortness of breath. What changed:  when to take this  reasons to take this   enoxaparin 120 MG/0.8ML injection Commonly known as:  LOVENOX Inject 0.8 mLs (120 mg total) into the skin every 12 (twelve) hours. Replaces:  enoxaparin 30 MG/0.3ML injection   HYDROcodone-acetaminophen 5-325 MG tablet Commonly known as:  NORCO Take 1 tablet by mouth every 4 (four) hours as needed for moderate pain.      Follow-up Information    Tilda Burrow, MD Follow up in 1 week(s).   Specialties:  Obstetrics and Gynecology, Radiology Contact information: [address] [place] [address] [phone]          No Known Allergies  Consultations:  OBGYN--[place]   Procedures/Studies: Dg Chest 1 View  Result Date: 04/16/2017 CLINICAL DATA:  Mid chest pain and shortness of breath for few days. History of asthma, pulmonary embolism. EXAM: CHEST 1 VIEW COMPARISON:  CT chest December 19, 2016 FINDINGS: Cardiomediastinal silhouette is unremarkable for this low inspiratory examination with crowded vasculature markings. The lungs are clear without pleural effusions or focal consolidations. Mild bronchitic changes. Trachea projects midline and there is no pneumothorax. Included soft tissue planes and osseous structures are non-suspicious. IMPRESSION: Mild bronchitic changes, no focal consolidation. Electronically Signed   By: Awilda Metro M.D.   On: 04/16/2017 04:06   US Ob Comp Less 14 Wks  Result Date: 04/16/2017 CLINICAL DATA:  Pain with positive pregnancy test EXAM: OBSTETRIC <14 WK ULTRASOUND TECHNIQUE: Transabdominal ultrasound was performed for evaluation of the gestation as well as the maternal uterus and adnexal regions. Patient declined transvaginal study. COMPARISON:  None. FINDINGS: Intrauterine gestational sac: Visualized Yolk sac:  Visualized Embryo:   Visualized Cardiac Activity: Visualized Heart Rate: 149 bpm CRL:   26  mm   9 w 3 d                  [place] [facility]: November 16, 2017 Subchorionic hemorrhage: There is a 1.5 x 1.2 cm subchorionic hemorrhage. Maternal uterus/adnexae: Cervical os closed. No extrauterine pelvic or adnexal masses are appreciable. Neither ovary is well seen by transabdominal only study. No free pelvic fluid. IMPRESSION: Single live intrauterine gestation with estimated gestational age of 9+ weeks. Small subchorionic hemorrhage noted. Neither ovary well-visualized on transabdominal only study. Note that patient declined transvaginal study. Electronically Signed   By: Bretta Bang III M.D.   On: 04/16/2017 18:55        Discharge Exam: Vitals:   04/17/17 0610 04/17/17 1202  BP:    Pulse:    Resp:    Temp:    SpO2: 96% 98%   Vitals:   04/16/17 2356 04/17/17 0500 04/17/17 0610 04/17/17 1202  BP:  126/62    Pulse:  79    Resp:  19    Temp:  98.9 F (37.2 C)    TempSrc:  Oral    SpO2: 97% 97% 96% 98%  Weight:      Height:        General: Pt is alert, awake, not in acute distress Cardiovascular: RRR, S1/S2 +, no rubs, no gallops Respiratory: CTA bilaterally, no wheezing, no rhonchi Abdominal: Soft, NT, ND, bowel sounds + Extremities: no edema, no cyanosis   The results of  significant diagnostics from this hospitalization (including imaging, microbiology, ancillary and laboratory) are listed below for reference.    Significant Diagnostic Studies: Dg Chest 1 View  Result Date: 04/16/2017 CLINICAL DATA:  Mid chest pain and shortness of breath for few days. History of asthma, pulmonary embolism. EXAM: CHEST 1 VIEW COMPARISON:  CT chest December 19, 2016 FINDINGS: Cardiomediastinal silhouette is unremarkable for this low inspiratory examination with crowded vasculature markings. The lungs are clear without pleural effusions or focal consolidations. Mild bronchitic changes. Trachea projects midline and there is no  pneumothorax. Included soft tissue planes and osseous structures are non-suspicious. IMPRESSION: Mild bronchitic changes, no focal consolidation. Electronically Signed   By: Awilda Metro M.D.   On: 04/16/2017 04:06   US Ob Comp Less 14 Wks  Result Date: 04/16/2017 CLINICAL DATA:  Pain with positive pregnancy test EXAM: OBSTETRIC <14 WK ULTRASOUND TECHNIQUE: Transabdominal ultrasound was performed for evaluation of the gestation as well as the maternal uterus and adnexal regions. Patient declined transvaginal study. COMPARISON:  None. FINDINGS: Intrauterine gestational sac: Visualized Yolk sac:  Visualized Embryo:  Visualized Cardiac Activity: Visualized Heart Rate: 149 bpm CRL:   26  mm   9 w 3 d                  [place] [facility]: November 16, 2017 Subchorionic hemorrhage: There is a 1.5 x 1.2 cm subchorionic hemorrhage. Maternal uterus/adnexae: Cervical os closed. No extrauterine pelvic or adnexal masses are appreciable. Neither ovary is well seen by transabdominal only study. No free pelvic fluid. IMPRESSION: Single live intrauterine gestation with estimated gestational age of 9+ weeks. Small subchorionic hemorrhage noted. Neither ovary well-visualized on transabdominal only study. Note that patient declined transvaginal study. Electronically Signed   By: Bretta Bang III M.D.   On: 04/16/2017 18:55     Microbiology: No results found for this or any previous visit (from the past 240 hour(s)).   Labs: Basic Metabolic Panel:  Recent Labs Lab 04/15/17 2331  NA 134*  K 3.5  CL 104  CO2 24  GLUCOSE 111*  BUN 9  CREATININE 0.78  CALCIUM 9.0   Liver Function Tests: No results for input(s): AST, ALT, ALKPHOS, BILITOT, PROT, ALBUMIN in the last 168 hours. No results for input(s): LIPASE, AMYLASE in the last 168 hours. No results for input(s): AMMONIA in the last 168 hours. CBC:  Recent Labs Lab 04/15/17 2331 04/17/17 0537  WBC 6.4 5.9  HGB 10.9* 10.3*  HCT 33.4* 31.7*  MCV 74.7* 75.7*    PLT 362 335   Cardiac Enzymes:  Recent Labs Lab 04/16/17 0929 04/16/17 1433  TROPONINI <0.03 <0.03   BNP: Invalid input(s): POCBNP CBG:  Recent Labs Lab 04/16/17 1631 04/17/17 0744 04/17/17 1130  GLUCAP 105* 140* 102*    Time coordinating discharge:  Greater than 30 minutes  Signed:  Braelynn Benning, DO [facility] Pager: [phone] 04/17/2017, 12:14 PM

## 2018-03-01 ENCOUNTER — Encounter (HOSPITAL_COMMUNITY)
Admission: EM | Disposition: A | Discharge status: Home / Self Care | Payer: Self-pay | Source: Home / Self Care | Attending: Emergency Medicine

## 2018-03-01 ENCOUNTER — Emergency Department (HOSPITAL_COMMUNITY): Payer: Medicaid - Out of State | Admitting: Anesthesiology

## 2018-03-01 ENCOUNTER — Other Ambulatory Visit: Payer: Self-pay

## 2018-03-01 ENCOUNTER — Observation Stay (HOSPITAL_COMMUNITY)
Admission: EM | Admit: 2018-03-01 | Discharge: 2018-03-02 | Disposition: A | Discharge status: Home / Self Care | Payer: Medicaid - Out of State | Attending: Obstetrics & Gynecology | Admitting: Obstetrics & Gynecology

## 2018-03-01 ENCOUNTER — Emergency Department (HOSPITAL_COMMUNITY): Payer: Medicaid - Out of State

## 2018-03-01 ENCOUNTER — Encounter (HOSPITAL_COMMUNITY): Payer: Self-pay | Admitting: Emergency Medicine

## 2018-03-01 DIAGNOSIS — N83511 Torsion of right ovary and ovarian pedicle: Secondary | ICD-10-CM

## 2018-03-01 DIAGNOSIS — Z7901 Long term (current) use of anticoagulants: Secondary | ICD-10-CM | POA: Diagnosis not present

## 2018-03-01 DIAGNOSIS — K661 Hemoperitoneum: Secondary | ICD-10-CM | POA: Insufficient documentation

## 2018-03-01 DIAGNOSIS — Z6841 Body Mass Index (BMI) 40.0 and over, adult: Secondary | ICD-10-CM | POA: Diagnosis not present

## 2018-03-01 DIAGNOSIS — G4730 Sleep apnea, unspecified: Secondary | ICD-10-CM | POA: Insufficient documentation

## 2018-03-01 DIAGNOSIS — Z86718 Personal history of other venous thrombosis and embolism: Secondary | ICD-10-CM | POA: Insufficient documentation

## 2018-03-01 DIAGNOSIS — F17210 Nicotine dependence, cigarettes, uncomplicated: Secondary | ICD-10-CM | POA: Insufficient documentation

## 2018-03-01 DIAGNOSIS — R102 Pelvic and perineal pain: Secondary | ICD-10-CM

## 2018-03-01 DIAGNOSIS — J45909 Unspecified asthma, uncomplicated: Secondary | ICD-10-CM | POA: Diagnosis not present

## 2018-03-01 DIAGNOSIS — N8311 Corpus luteum cyst of right ovary: Secondary | ICD-10-CM | POA: Diagnosis not present

## 2018-03-01 DIAGNOSIS — N838 Other noninflammatory disorders of ovary, fallopian tube and broad ligament: Secondary | ICD-10-CM | POA: Insufficient documentation

## 2018-03-01 DIAGNOSIS — Z79899 Other long term (current) drug therapy: Secondary | ICD-10-CM | POA: Insufficient documentation

## 2018-03-01 DIAGNOSIS — E079 Disorder of thyroid, unspecified: Secondary | ICD-10-CM | POA: Insufficient documentation

## 2018-03-01 DIAGNOSIS — Z86711 Personal history of pulmonary embolism: Secondary | ICD-10-CM | POA: Diagnosis not present

## 2018-03-01 DIAGNOSIS — Z90721 Acquired absence of ovaries, unilateral: Secondary | ICD-10-CM

## 2018-03-01 DIAGNOSIS — N8353 Torsion of ovary, ovarian pedicle and fallopian tube: Secondary | ICD-10-CM

## 2018-03-01 HISTORY — PX: SALPINGOOPHORECTOMY: SHX82

## 2018-03-01 LAB — CBC WITH DIFFERENTIAL/PLATELET
Basophils Absolute: 0 10*3/uL (ref 0.0–0.1)
Basophils Relative: 0 %
Eosinophils Absolute: 0.2 10*3/uL (ref 0.0–0.7)
Eosinophils Relative: 3 %
HCT: 30.3 % — ABNORMAL LOW (ref 36.0–46.0)
Hemoglobin: 9.3 g/dL — ABNORMAL LOW (ref 12.0–15.0)
Lymphocytes Relative: 25 %
Lymphs Abs: 1.4 10*3/uL (ref 0.7–4.0)
MCH: 23 pg — ABNORMAL LOW (ref 26.0–34.0)
MCHC: 30.7 g/dL (ref 30.0–36.0)
MCV: 74.8 fL — ABNORMAL LOW (ref 78.0–100.0)
Monocytes Absolute: 0.4 10*3/uL (ref 0.1–1.0)
Monocytes Relative: 8 %
Neutro Abs: 3.5 10*3/uL (ref 1.7–7.7)
Neutrophils Relative %: 64 %
Platelets: 413 10*3/uL — ABNORMAL HIGH (ref 150–400)
RBC: 4.05 MIL/uL (ref 3.87–5.11)
RDW: 17.9 % — ABNORMAL HIGH (ref 11.5–15.5)
WBC: 5.5 10*3/uL (ref 4.0–10.5)

## 2018-03-01 LAB — BASIC METABOLIC PANEL
Anion gap: 6 (ref 5–15)
BUN: 15 mg/dL (ref 6–20)
CO2: 25 mmol/L (ref 22–32)
Calcium: 8.6 mg/dL — ABNORMAL LOW (ref 8.9–10.3)
Chloride: 108 mmol/L (ref 98–111)
Creatinine, Ser: 0.75 mg/dL (ref 0.44–1.00)
GFR calc Af Amer: >60 mL/min (ref >60)
GFR calc non Af Amer: >60 mL/min (ref >60)
Glucose, Bld: 118 mg/dL — ABNORMAL HIGH (ref 70–99)
Potassium: 3.5 mmol/L (ref 3.5–5.1)
Sodium: 139 mmol/L (ref 135–145)

## 2018-03-01 LAB — TYPE AND SCREEN
ABO/RH(D): A POS
Antibody Screen: NEGATIVE

## 2018-03-01 LAB — URINALYSIS, ROUTINE W REFLEX MICROSCOPIC
Bilirubin Urine: NEGATIVE
Glucose, UA: NEGATIVE mg/dL
Hgb urine dipstick: NEGATIVE
Ketones, ur: NEGATIVE mg/dL
Leukocytes, UA: NEGATIVE
Nitrite: NEGATIVE
Protein, ur: NEGATIVE mg/dL
Specific Gravity, Urine: >1.03 — ABNORMAL HIGH (ref 1.005–1.030)
pH: 6 (ref 5.0–8.0)

## 2018-03-01 LAB — WET PREP, GENITAL
Clue Cells Wet Prep HPF POC: NONE SEEN
Sperm: NONE SEEN
Trich, Wet Prep: NONE SEEN
Yeast Wet Prep HPF POC: NONE SEEN

## 2018-03-01 LAB — PREGNANCY, URINE: Preg Test, Ur: NEGATIVE

## 2018-03-01 LAB — GLUCOSE, CAPILLARY: Glucose-Capillary: 107 mg/dL — ABNORMAL HIGH (ref 70–99)

## 2018-03-01 LAB — CBG MONITORING, ED: Glucose-Capillary: 147 mg/dL — ABNORMAL HIGH (ref 70–99)

## 2018-03-01 SURGERY — SALPINGO-OOPHORECTOMY, OPEN
Anesthesia: General | Laterality: Right

## 2018-03-01 MED ORDER — FENTANYL CITRATE (PF) 100 MCG/2ML IJ SOLN
INTRAMUSCULAR | Status: DC | PRN
  Administered 2018-03-01 (×2): 25 ug via INTRAVENOUS
  Administered 2018-03-01 (×2): 100 ug via INTRAVENOUS

## 2018-03-01 MED ORDER — ZOLPIDEM TARTRATE 5 MG PO TABS: 5.0000 mg | ORAL_TABLET | Freq: Every evening | ORAL | Status: DC | PRN

## 2018-03-01 MED ORDER — ONDANSETRON HCL 4 MG/2ML IJ SOLN
INTRAMUSCULAR | Status: DC | PRN
  Administered 2018-03-01: 4 mg via INTRAVENOUS

## 2018-03-01 MED ORDER — AZELASTINE HCL 0.1 % NA SOLN
2.0000 | Freq: Two times a day (BID) | NASAL | Status: DC
  Administered 2018-03-02: 2 via NASAL
  Filled 2018-03-01 (×2): qty 30

## 2018-03-01 MED ORDER — SODIUM CHLORIDE 0.9 % IR SOLN
Status: DC | PRN
  Administered 2018-03-01 (×3): 1000 mL

## 2018-03-01 MED ORDER — PROPOFOL 10 MG/ML IV BOLUS
INTRAVENOUS | Status: AC
  Filled 2018-03-01: qty 20

## 2018-03-01 MED ORDER — HYDROMORPHONE HCL 1 MG/ML IJ SOLN
0.2500 mg | INTRAMUSCULAR | Status: DC | PRN
  Administered 2018-03-01: 0.5 mg via INTRAVENOUS
  Filled 2018-03-01: qty 0.5

## 2018-03-01 MED ORDER — ROCURONIUM BROMIDE 100 MG/10ML IV SOLN
INTRAVENOUS | Status: DC | PRN
  Administered 2018-03-01: 30 mg via INTRAVENOUS

## 2018-03-01 MED ORDER — SENNOSIDES-DOCUSATE SODIUM 8.6-50 MG PO TABS: 1.0000 | ORAL_TABLET | Freq: Every evening | ORAL | Status: DC | PRN

## 2018-03-01 MED ORDER — GLYCOPYRROLATE 0.2 MG/ML IJ SOLN
INTRAMUSCULAR | Status: AC
Start: 2018-03-01 — End: ?
  Filled 2018-03-01: qty 1

## 2018-03-01 MED ORDER — GLYCOPYRROLATE 0.2 MG/ML IJ SOLN
INTRAMUSCULAR | Status: DC | PRN
  Administered 2018-03-01: 0.6 mg via INTRAVENOUS
  Administered 2018-03-01: 0.2 mg via INTRAVENOUS

## 2018-03-01 MED ORDER — MIDAZOLAM HCL 2 MG/2ML IJ SOLN
INTRAMUSCULAR | Status: AC
  Filled 2018-03-01: qty 2

## 2018-03-01 MED ORDER — KETOROLAC TROMETHAMINE 60 MG/2ML IM SOLN
60.0000 mg | Freq: Once | INTRAMUSCULAR | Status: DC
  Filled 2018-03-01: qty 2

## 2018-03-01 MED ORDER — METOCLOPRAMIDE HCL 5 MG/ML IJ SOLN
INTRAMUSCULAR | Status: DC | PRN
  Administered 2018-03-01: 20 mg via INTRAVENOUS

## 2018-03-01 MED ORDER — ROCURONIUM BROMIDE 50 MG/5ML IV SOLN
INTRAVENOUS | Status: AC
  Filled 2018-03-01: qty 1

## 2018-03-01 MED ORDER — DOCUSATE SODIUM 100 MG PO CAPS
100.0000 mg | ORAL_CAPSULE | Freq: Two times a day (BID) | ORAL | Status: DC
  Administered 2018-03-01 – 2018-03-02 (×2): 100 mg via ORAL
  Filled 2018-03-01 (×2): qty 1

## 2018-03-01 MED ORDER — NEOSTIGMINE METHYLSULFATE 10 MG/10ML IV SOLN
INTRAVENOUS | Status: DC | PRN
  Administered 2018-03-01: 3 mg via INTRAVENOUS

## 2018-03-01 MED ORDER — KETOROLAC TROMETHAMINE 30 MG/ML IJ SOLN
30.0000 mg | Freq: Four times a day (QID) | INTRAMUSCULAR | Status: AC
  Administered 2018-03-02 (×2): 30 mg via INTRAVENOUS
  Filled 2018-03-01 (×2): qty 1

## 2018-03-01 MED ORDER — HYDROCODONE-ACETAMINOPHEN 5-325 MG PO TABS
1.0000 | ORAL_TABLET | Freq: Once | ORAL | Status: AC
  Administered 2018-03-01: 1 via ORAL
  Filled 2018-03-01: qty 1

## 2018-03-01 MED ORDER — FENTANYL CITRATE (PF) 250 MCG/5ML IJ SOLN
INTRAMUSCULAR | Status: AC
Start: 2018-03-01 — End: ?
  Filled 2018-03-01: qty 5

## 2018-03-01 MED ORDER — HEMOSTATIC AGENTS (NO CHARGE) OPTIME
TOPICAL | Status: DC | PRN
  Administered 2018-03-01: 1 via TOPICAL

## 2018-03-01 MED ORDER — LACTATED RINGERS IV SOLN
INTRAVENOUS | Status: DC
  Administered 2018-03-01: 19:00:00 via INTRAVENOUS

## 2018-03-01 MED ORDER — NEOSTIGMINE METHYLSULFATE 10 MG/10ML IV SOLN
INTRAVENOUS | Status: AC
Start: 2018-03-01 — End: ?
  Filled 2018-03-01: qty 1

## 2018-03-01 MED ORDER — PHENYLEPHRINE 40 MCG/ML (10ML) SYRINGE FOR IV PUSH (FOR BLOOD PRESSURE SUPPORT)
PREFILLED_SYRINGE | INTRAVENOUS | Status: AC
  Filled 2018-03-01: qty 10

## 2018-03-01 MED ORDER — ARTIFICIAL TEARS OPHTHALMIC OINT
TOPICAL_OINTMENT | OPHTHALMIC | Status: AC
  Filled 2018-03-01: qty 3.5

## 2018-03-01 MED ORDER — BISACODYL 10 MG RE SUPP: 10.0000 mg | Freq: Every day | RECTAL | Status: DC | PRN

## 2018-03-01 MED ORDER — FERUMOXYTOL INJECTION 510 MG/17 ML
INTRAVENOUS | Status: AC
  Filled 2018-03-01: qty 17

## 2018-03-01 MED ORDER — ONDANSETRON HCL 4 MG/2ML IJ SOLN
INTRAMUSCULAR | Status: AC
  Filled 2018-03-01: qty 2

## 2018-03-01 MED ORDER — DIPHENHYDRAMINE HCL 25 MG PO CAPS: 25.0000 mg | ORAL_CAPSULE | Freq: Four times a day (QID) | ORAL | Status: DC | PRN

## 2018-03-01 MED ORDER — MEPERIDINE HCL 50 MG/ML IJ SOLN: 6.2500 mg | INTRAMUSCULAR | Status: DC | PRN

## 2018-03-01 MED ORDER — KETOROLAC TROMETHAMINE 30 MG/ML IJ SOLN
INTRAMUSCULAR | Status: AC
  Filled 2018-03-01: qty 1

## 2018-03-01 MED ORDER — PROPOFOL 10 MG/ML IV BOLUS
INTRAVENOUS | Status: DC | PRN
  Administered 2018-03-01: 200 mg via INTRAVENOUS

## 2018-03-01 MED ORDER — METOCLOPRAMIDE HCL 5 MG/ML IJ SOLN
INTRAMUSCULAR | Status: AC
  Filled 2018-03-01: qty 4

## 2018-03-01 MED ORDER — ALUM & MAG HYDROXIDE-SIMETH 200-200-20 MG/5ML PO SUSP: 30.0000 mL | ORAL | Status: DC | PRN

## 2018-03-01 MED ORDER — FAMOTIDINE 20 MG PO TABS
20.0000 mg | ORAL_TABLET | Freq: Every day | ORAL | Status: DC
  Administered 2018-03-01 – 2018-03-02 (×2): 20 mg via ORAL
  Filled 2018-03-01 (×2): qty 1

## 2018-03-01 MED ORDER — MIDAZOLAM HCL 2 MG/2ML IJ SOLN
INTRAMUSCULAR | Status: DC | PRN
  Administered 2018-03-01: 2 mg via INTRAVENOUS

## 2018-03-01 MED ORDER — LACTATED RINGERS IV SOLN: INTRAVENOUS | Status: DC

## 2018-03-01 MED ORDER — KETOROLAC TROMETHAMINE 30 MG/ML IJ SOLN
30.0000 mg | Freq: Once | INTRAMUSCULAR | Status: AC
  Administered 2018-03-01: 30 mg via INTRAVENOUS
  Filled 2018-03-01: qty 1

## 2018-03-01 MED ORDER — SODIUM CHLORIDE 0.9 % IV SOLN
510.0000 mg | Freq: Once | INTRAVENOUS | Status: AC
  Administered 2018-03-01: 510 mg via INTRAVENOUS
  Filled 2018-03-01: qty 17

## 2018-03-01 MED ORDER — SUCCINYLCHOLINE CHLORIDE 20 MG/ML IJ SOLN
INTRAMUSCULAR | Status: DC | PRN
  Administered 2018-03-01: 120 mg via INTRAVENOUS

## 2018-03-01 MED ORDER — BUPIVACAINE LIPOSOME 1.3 % IJ SUSP
INTRAMUSCULAR | Status: AC
  Filled 2018-03-01: qty 20

## 2018-03-01 MED ORDER — HYDROMORPHONE HCL 1 MG/ML IJ SOLN
1.0000 mg | Freq: Once | INTRAMUSCULAR | Status: AC
  Administered 2018-03-01: 1 mg via INTRAMUSCULAR
  Filled 2018-03-01: qty 1

## 2018-03-01 MED ORDER — PROMETHAZINE HCL 25 MG/ML IJ SOLN: 25.0000 mg | Freq: Four times a day (QID) | INTRAMUSCULAR | Status: DC | PRN

## 2018-03-01 MED ORDER — BUPIVACAINE LIPOSOME 1.3 % IJ SUSP
INTRAMUSCULAR | Status: DC | PRN
  Administered 2018-03-01: 20 mL

## 2018-03-01 MED ORDER — DEXTROSE 5 % IV SOLN
3.0000 g | INTRAVENOUS | Status: DC
  Filled 2018-03-01: qty 3000

## 2018-03-01 MED ORDER — LOSARTAN POTASSIUM 50 MG PO TABS
50.0000 mg | ORAL_TABLET | Freq: Every day | ORAL | Status: DC
  Administered 2018-03-01 – 2018-03-02 (×2): 50 mg via ORAL
  Filled 2018-03-01 (×2): qty 1

## 2018-03-01 MED ORDER — HYDROCODONE-ACETAMINOPHEN 7.5-325 MG PO TABS: 1.0000 | ORAL_TABLET | Freq: Once | ORAL | Status: DC | PRN

## 2018-03-01 MED ORDER — OXYCODONE-ACETAMINOPHEN 5-325 MG PO TABS
1.0000 | ORAL_TABLET | ORAL | Status: DC | PRN
  Administered 2018-03-02: 1 via ORAL
  Filled 2018-03-01: qty 1

## 2018-03-01 MED ORDER — PROMETHAZINE HCL 25 MG/ML IJ SOLN: 6.2500 mg | INTRAMUSCULAR | Status: DC | PRN

## 2018-03-01 MED ORDER — KCL IN DEXTROSE-NACL 40-5-0.45 MEQ/L-%-% IV SOLN
INTRAVENOUS | Status: DC
  Administered 2018-03-01: 23:00:00 via INTRAVENOUS

## 2018-03-01 MED ORDER — LORATADINE 10 MG PO TABS
10.0000 mg | ORAL_TABLET | Freq: Every day | ORAL | Status: DC
  Administered 2018-03-01 – 2018-03-02 (×2): 10 mg via ORAL
  Filled 2018-03-01 (×2): qty 1

## 2018-03-01 MED ORDER — ALBUTEROL SULFATE (2.5 MG/3ML) 0.083% IN NEBU: 3.0000 mL | INHALATION_SOLUTION | Freq: Four times a day (QID) | RESPIRATORY_TRACT | Status: DC | PRN

## 2018-03-01 MED ORDER — HYDROMORPHONE HCL 1 MG/ML IJ SOLN
1.0000 mg | INTRAMUSCULAR | Status: DC | PRN
  Administered 2018-03-01 – 2018-03-02 (×3): 1 mg via INTRAVENOUS
  Filled 2018-03-01 (×3): qty 1

## 2018-03-01 SURGICAL SUPPLY — 40 items
APPLIER CLIP 13 LRG OPEN (CLIP)
CLIP APPLIE 13 LRG OPEN (CLIP) IMPLANT
CLOTH BEACON ORANGE TIMEOUT ST (SAFETY) ×2 IMPLANT
COVER LIGHT HANDLE STERIS (MISCELLANEOUS) ×4 IMPLANT
DERMABOND ADVANCED (GAUZE/BANDAGES/DRESSINGS) ×1
DERMABOND ADVANCED .7 DNX12 (GAUZE/BANDAGES/DRESSINGS) ×1 IMPLANT
DRAPE LAPAROTOMY TRNSV 102X78 (DRAPE) ×2 IMPLANT
DRAPE WARM FLUID 44X44 (DRAPE) ×2 IMPLANT
DRSG OPSITE POSTOP 4X10 (GAUZE/BANDAGES/DRESSINGS) ×4 IMPLANT
ELECT REM PT RETURN 9FT ADLT (ELECTROSURGICAL) ×2
ELECTRODE REM PT RTRN 9FT ADLT (ELECTROSURGICAL) ×1 IMPLANT
GLOVE BIOGEL PI IND STRL 7.0 (GLOVE) ×1 IMPLANT
GLOVE BIOGEL PI IND STRL 8 (GLOVE) ×1 IMPLANT
GLOVE BIOGEL PI INDICATOR 7.0 (GLOVE) ×1
GLOVE BIOGEL PI INDICATOR 8 (GLOVE) ×1
GLOVE ECLIPSE 8.0 STRL XLNG CF (GLOVE) ×2 IMPLANT
GOWN STRL REUS W/TWL LRG LVL3 (GOWN DISPOSABLE) ×4 IMPLANT
GOWN STRL REUS W/TWL XL LVL3 (GOWN DISPOSABLE) ×2 IMPLANT
HANDLE SUCTION POOLE (INSTRUMENTS) ×1 IMPLANT
HEMOSTAT ARISTA ABSORB 3G PWDR (MISCELLANEOUS) ×2 IMPLANT
INST SET MAJOR GENERAL (KITS) ×2 IMPLANT
KIT TURNOVER KIT A (KITS) ×2 IMPLANT
MANIFOLD NEPTUNE II (INSTRUMENTS) ×2 IMPLANT
NS IRRIG 1000ML POUR BTL (IV SOLUTION) ×6 IMPLANT
PACK ABDOMINAL MAJOR (CUSTOM PROCEDURE TRAY) ×2 IMPLANT
PAD ARMBOARD 7.5X6 YLW CONV (MISCELLANEOUS) ×2 IMPLANT
SET BASIN LINEN APH (SET/KITS/TRAYS/PACK) ×2 IMPLANT
SPONGE LAP 18X18 X RAY DECT (DISPOSABLE) ×6 IMPLANT
STAPLER VISISTAT (STAPLE) ×2 IMPLANT
SUCTION POOLE HANDLE (INSTRUMENTS) ×2
SUT CHROMIC 0 CT 1 (SUTURE) ×6 IMPLANT
SUT MON AB 3-0 SH 27 (SUTURE) IMPLANT
SUT VIC AB 0 CT1 27 (SUTURE) ×2
SUT VIC AB 0 CT1 27XBRD ANTBC (SUTURE) ×1 IMPLANT
SUT VIC AB 0 CT1 27XCR 8 STRN (SUTURE) ×1 IMPLANT
SUT VIC AB 0 CTX 36 (SUTURE) ×2
SUT VIC AB 0 CTX36XBRD ANTBCTR (SUTURE) ×2 IMPLANT
SUT VICRYL 3 0 (SUTURE) ×2 IMPLANT
SYR BULB IRRIGATION 50ML (SYRINGE) ×2 IMPLANT
TRAY FOLEY MTR SLVR 16FR STAT (SET/KITS/TRAYS/PACK) ×2 IMPLANT

## 2018-03-01 NOTE — Transfer of Care (Signed)
Immediate Anesthesia Transfer of Care Note  Patient: Donna Douglas  Procedure(s) Performed: LAPARATOMY WITH SALPINGO OOPHORECTOMY RIGHT SIDE (Right )  Patient Location: PACU  Anesthesia Type:General  Level of Consciousness: awake, drowsy and patient cooperative  Airway & Oxygen Therapy: Patient Spontanous Breathing and Patient connected to face mask oxygen  Post-op Assessment: Report given to RN  Post vital signs: Reviewed and stable  Last Vitals:  Vitals Value Taken Time  BP 119/61   Temp 98.2   Pulse 48 03/01/2018  8:07 PM  Resp    SpO2 100 03/01/2018  8:07 PM  Vitals shown include unvalidated device data.  Last Pain:  Vitals:   03/01/18 1823  TempSrc: Oral  PainSc: 8       Patients Stated Pain Goal: 5 (03/01/18 1823)  Complications: No apparent anesthesia complications

## 2018-03-01 NOTE — ED Provider Notes (Signed)
[facility] EMERGENCY DEPARTMENT Provider Note   CSN: [id number] Arrival date & time: 03/01/18  1102     History   Chief Complaint Chief Complaint  Patient presents with  . Abdominal Pain    HPI Donna Douglas is a 37 y.o. female.  HPI  Pt was seen at 1330. Per pt, c/o gradual onset and persistence of constant pelvic "pain" for the past 4 days. Pt states the pain began after "rough intercourse." Pt describes the pain as located in her suprapubic area, "sharp," "cramping." Pt states she "knows it's my IUD" and wants it removed. Pt has OB/GYN appt in 3 days. Pt had several episodes of loose stools 4 days ago, but that has resolved. Denies hematuria/dysuria, no vaginal bleeding/discharge, no N/V, no black or blood in stools, no back/flank pain, no CP/SOB, no fevers, no rash.   Past Medical History:  Diagnosis Date  . Asthma   . Diabetes mellitus without complication (HCC)   . DVT (deep venous thrombosis) (HCC)   . Pulmonary emboli (HCC)   . Sleep apnea   . Thyroid disease     Patient Active Problem List   Diagnosis Date Noted  . [redacted] weeks gestation of pregnancy   . Pleurisy   . Chest pain 04/16/2017  . Intrauterine pregnancy 04/16/2017  . 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 DM (diabetes mellitus) (HCC) 03/23/2016  . Thyroid condition 03/23/2016  . Pulmonary emboli (HCC) 01/05/2016  . Bilateral pulmonary embolism (HCC) 01/05/2016    Past Surgical History:  Procedure Laterality Date  . anxiety    . IVC FILTER PLACEMENT ([facility] HX)       OB History    Gravida  8   Para  8   Term      Preterm      AB      Living  8     SAB      TAB      Ectopic      Multiple      Live Births               Home Medications    Prior to Admission medications   Medication Sig Start Date End Date Taking? Authorizing Provider  citalopram (CELEXA)  20 MG tablet Take 1 tablet by mouth daily. 09/27/17  Yes [provider]  docusate sodium (COLACE) 100 MG capsule Take 1 capsule by mouth 2 (two) times daily. 07/13/17  Yes [provider]  ferrous sulfate 325 (65 FE) MG tablet Take 1 tablet by mouth 3 (three) times daily. 09/27/17  Yes [provider]  folic acid (FOLVITE) 1 MG tablet Take 1 tablet by mouth daily. 09/27/17  Yes [provider]  ranitidine (ZANTAC) 150 MG tablet Take 1 tablet by mouth 2 (two) times daily. 07/08/17  Yes [provider]  sitaGLIPtin (JANUVIA) 50 MG tablet Take 1 tablet by mouth daily. 10/29/17  Yes [provider]  acetaminophen (TYLENOL) 500 MG tablet Take 1,000 mg by mouth every 6 (six) hours as needed for mild pain.    [provider]  albuterol (VENTOLIN HFA) 108 (90 Base) MCG/ACT inhaler Inhale 2 puffs into the lungs every 6 (six) hours as needed for wheezing or shortness of breath. 04/17/17   Catarina Hartshorn, MD  azelastine (ASTELIN) 0.1 % nasal spray Place 2 sprays into both nostrils 2 (two) times daily.  01/19/18   [provider]  cetirizine (ZYRTEC) 10 MG tablet Take 1 tablet by mouth daily as needed. 12/10/17   [provider]  cyclobenzaprine (FLEXERIL) 10 MG tablet Take 1 tablet by mouth at bedtime as needed. 12/19/17   [provider]  enoxaparin (LOVENOX) 120 MG/0.8ML injection Inject 0.8 mLs (120 mg total) into the skin every 12 (twelve) hours. 04/17/17   Catarina Hartshornat, David, MD  HYDROcodone-acetaminophen (NORCO) 5-325 MG tablet Take 1 tablet by mouth every 4 (four) hours as needed for moderate pain. 04/17/17   Catarina Hartshornat, David, MD  ibuprofen (ADVIL,MOTRIN) 600 MG tablet Take 1 tablet by mouth every 6 (six) hours as needed. 02/14/18   [provider]  losartan (COZAAR) 50 MG tablet Take 50 mg by mouth daily. 01/19/18   [provider]  SUMAtriptan (IMITREX) 50 MG tablet Take 1 tablet by mouth as directed. Take 1 tablet by mouth as  needed at the onset of migraine; may repeat dose in 2 hours if needed - do not exceed 200mg  in 24 hours 12/07/17   [provider]  warfarin (COUMADIN) 10 MG tablet Take 10 mg by mouth daily. as directed 01/19/18   [provider]    Family History History reviewed. No pertinent family history.  Social History Social History   Tobacco Use  . Smoking status: Current Every Day Smoker    Packs/day: 1.50    Types: Cigarettes  . Smokeless tobacco: Current User  Substance Use Topics  . Alcohol use: No  . Drug use: Yes    Types: Marijuana    Comment: every now and then     Allergies   Patient has no known allergies.   Review of Systems Review of Systems ROS: Statement: All systems negative except as marked or noted in the HPI; Constitutional: Negative for fever and chills. ; ; Eyes: Negative for eye pain, redness and discharge. ; ; ENMT: Negative for ear pain, hoarseness, nasal congestion, sinus pressure and sore throat. ; ; Cardiovascular: Negative for chest pain, palpitations, diaphoresis, dyspnea and peripheral edema. ; ; Respiratory: Negative for cough, wheezing and stridor. ; ; Gastrointestinal: Negative for nausea, vomiting, diarrhea, abdominal pain, blood in stool, hematemesis, jaundice and rectal bleeding. . ; ; Genitourinary: Negative for dysuria, flank pain and hematuria. ; ; GYN:  +pelvic pain, no vaginal bleeding, no vaginal discharge, no vulvar pain. ;; Musculoskeletal: Negative for back pain and neck pain. Negative for swelling and trauma.; ; Skin: Negative for pruritus, rash, abrasions, blisters, bruising and skin lesion.; ; Neuro: Negative for headache, lightheadedness and neck stiffness. Negative for weakness, altered level of consciousness, altered mental status, extremity weakness, paresthesias, involuntary movement, seizure and syncope.       Physical Exam Updated Vital Signs BP (!) 162/90 (BP Location: Right Wrist)   Pulse 99   Temp 98.9 F (37.2 C)  (Oral)   Resp 19   LMP 02/07/2018   SpO2 98%    Patient Vitals for the past 24 hrs:  BP Temp Temp src Pulse Resp SpO2 Height Weight  03/01/18 1823 132/74 97.8 F (36.6 C) Oral 71 20 99 % 5\' 5"  (1.651 m) 121.6 kg (268 lb)  03/01/18 1800 115/84 - - 73 - 100 % - -  03/01/18 1740 (!) 170/72 - - 83 18 - - -  03/01/18 1740 - - - 88 - 100 % - -  03/01/18 1739 (!) 170/72 - - - - - - -  03/01/18 1111 (!) 162/90 98.9 F (  37.2 C) Oral 99 19 98 % - -     Physical Exam 1335: Physical examination:  Nursing notes reviewed; Vital signs and O2 SAT reviewed;  Constitutional: Well developed, Well nourished, Well hydrated, In no acute distress. Quietly laying eyes closed on my arrival to exam room, NAD..; Head:  Normocephalic, atraumatic; Eyes: EOMI, PERRL, No scleral icterus; ENMT: Mouth and pharynx normal, Mucous membranes moist; Neck: Supple, Full range of motion, No lymphadenopathy; Cardiovascular: Regular rate and rhythm, No gallop; Respiratory: Breath sounds clear & equal bilaterally, No wheezes.  Speaking full sentences with ease, Normal respiratory effort/excursion; Chest: Nontender, Movement normal; Abdomen: Soft, +suprapubic tenderness to palp. Nondistended, Normal bowel sounds; Genitourinary: No CVA tenderness..;;; Extremities: Peripheral pulses normal, No tenderness, No edema, No calf edema or asymmetry.; Neuro: AA&Ox3, Major CN grossly intact.  Speech clear. No gross focal motor or sensory deficits in extremities.; Skin: Color normal, Warm, Dry.   ED Treatments / Results  Labs (all labs ordered are listed, but only abnormal results are displayed)   EKG None  Radiology   Procedures Procedures (including critical care time)  Medications Ordered in ED Medications  ketorolac (TORADOL) injection 60 mg (has no administration in time range)  HYDROcodone-acetaminophen (NORCO/VICODIN) 5-325 MG per tablet 1 tablet (has no administration in time range)  HYDROcodone-acetaminophen  (NORCO/VICODIN) 5-325 MG per tablet 1 tablet (1 tablet Oral Given 03/01/18 1136)     Initial Impression / Assessment and Plan / ED Course  I have reviewed the triage vital signs and the nursing notes.  Pertinent labs & imaging results that were available during my care of the patient were reviewed by me and considered in my medical decision making (see chart for details).  MDM Reviewed: previous chart, nursing note and vitals Reviewed previous: labs Interpretation: labs and ultrasound Total time providing critical care: 30-74 minutes. This excludes time spent performing separately reportable procedures and services. Consults: OB/GYN   CRITICAL CARE Performed by: Laray Anger Total critical care time: 35 minutes Critical care time was exclusive of separately billable procedures and treating other patients. Critical care was necessary to treat or prevent imminent or life-threatening deterioration. Critical care was time spent personally by me on the following activities: development of treatment plan with patient and/or surrogate as well as nursing, discussions with consultants, evaluation of patient's response to treatment, examination of patient, obtaining history from patient or surrogate, ordering and performing treatments and interventions, ordering and review of laboratory studies, ordering and review of radiographic studies, pulse oximetry and re-evaluation of patient's condition.  Results for orders placed or performed during the hospital encounter of 03/01/18  Wet prep, genital  Result Value Ref Range   Yeast Wet Prep HPF POC NONE SEEN NONE SEEN   Trich, Wet Prep NONE SEEN NONE SEEN   Clue Cells Wet Prep HPF POC NONE SEEN NONE SEEN   WBC, Wet Prep HPF POC FEW (A) NONE SEEN   Sperm NONE SEEN   CBC with Differential  Result Value Ref Range   WBC 5.5 4.0 - 10.5 K/uL   RBC 4.05 3.87 - 5.11 MIL/uL   Hemoglobin 9.3 (L) 12.0 - 15.0 g/dL   HCT 69.6 (L) 29.5 - 28.4 %   MCV  74.8 (L) 78.0 - 100.0 fL   MCH 23.0 (L) 26.0 - 34.0 pg   MCHC 30.7 30.0 - 36.0 g/dL   RDW 13.2 (H) 44.0 - 10.2 %   Platelets 413 (H) 150 - 400 K/uL   Neutrophils Relative % 64 %  Neutro Abs 3.5 1.7 - 7.7 K/uL   Lymphocytes Relative 25 %   Lymphs Abs 1.4 0.7 - 4.0 K/uL   Monocytes Relative 8 %   Monocytes Absolute 0.4 0.1 - 1.0 K/uL   Eosinophils Relative 3 %   Eosinophils Absolute 0.2 0.0 - 0.7 K/uL   Basophils Relative 0 %   Basophils Absolute 0.0 0.0 - 0.1 K/uL  Basic metabolic panel  Result Value Ref Range   Sodium 139 135 - 145 mmol/L   Potassium 3.5 3.5 - 5.1 mmol/L   Chloride 108 98 - 111 mmol/L   CO2 25 22 - 32 mmol/L   Glucose, Bld 118 (H) 70 - 99 mg/dL   BUN 15 6 - 20 mg/dL   Creatinine, Ser 1.61 0.44 - 1.00 mg/dL   Calcium 8.6 (L) 8.9 - 10.3 mg/dL   GFR calc non Af Amer >60 >60 mL/min   GFR calc Af Amer >60 >60 mL/min   Anion gap 6 5 - 15  Urinalysis, Routine w reflex microscopic  Result Value Ref Range   Color, Urine YELLOW YELLOW   APPearance CLEAR CLEAR   Specific Gravity, Urine >1.030 (H) 1.005 - 1.030   pH 6.0 5.0 - 8.0   Glucose, UA NEGATIVE NEGATIVE mg/dL   Hgb urine dipstick NEGATIVE NEGATIVE   Bilirubin Urine NEGATIVE NEGATIVE   Ketones, ur NEGATIVE NEGATIVE mg/dL   Protein, ur NEGATIVE NEGATIVE mg/dL   Nitrite NEGATIVE NEGATIVE   Leukocytes, UA NEGATIVE NEGATIVE  Pregnancy, urine  Result Value Ref Range   Preg Test, Ur NEGATIVE NEGATIVE  CBG monitoring, ED  Result Value Ref Range   Glucose-Capillary 147 (H) 70 - 99 mg/dL   US Pelvis Transvanginal Non-ob (tv Only) Result Date: 03/01/2018 CLINICAL DATA:  Pelvic pain 4 days EXAM: TRANSABDOMINAL AND TRANSVAGINAL ULTRASOUND OF PELVIS DOPPLER ULTRASOUND OF OVARIES TECHNIQUE: Both transabdominal and transvaginal ultrasound examinations of the pelvis were performed. Transabdominal technique was performed for global imaging of the pelvis including uterus, ovaries, adnexal regions, and pelvic cul-de-sac. It  was necessary to proceed with endovaginal exam following the transabdominal exam to visualize the endometrium and ovaries. Color and duplex Doppler ultrasound was utilized to evaluate blood flow to the ovaries. COMPARISON:  None. FINDINGS: Uterus Measurements: 12 x 6.3 x 6 cm. No fibroids or other mass visualized. No intrauterine device is identified. Endometrium Thickness: 12 mm.  No focal abnormality visualized. Right ovary Measurements: 10.2 x 3.8 x 7.3 cm.  Enlarged.  No adnexal mass. Left ovary Measurements: 2.4 x 2.2 x 2.1 cm. Normal appearance/no adnexal mass. Pulsed Doppler evaluation of both ovaries demonstrates: Normal low-resistance arterial and venous waveforms in the left ovary. No arterial or venous waveforms in the right ovary. Other findings No abnormal free fluid. IMPRESSION: 1. Enlarged right ovary without internal Doppler flow most concerning for ovarian torsion. Critical Value/emergent results were called by telephone at the time of interpretation on 03/01/2018 at 3:11 pm to Dr. Samuel Jester , who verbally acknowledged these results. Electronically Signed   By: Elige Ko   On: 03/01/2018 15:12    Results for CARELY, NAPPIER (MRN [id number]) as of 03/01/2018 15:18  Ref. Range 03/24/2016 04:31 12/19/2016 03:19 04/15/2017 23:31 04/17/2017 05:37 03/01/2018 12:08  Hemoglobin Latest Ref Range: 12.0 - 15.0 g/dL 9.7 (L) 81.1 (L) 91.4 (L) 10.3 (L) 9.3 (L)  HCT Latest Ref Range: 36.0 - 46.0 % 30.6 (L) 35.0 (L) 33.4 (L) 31.7 (L) 30.3 (L)     1620:  Pelvic exam performed with  permission of pt and female ED tech assist during exam.  External genitalia w/o lesions. Vaginal vault with scant white discharge.  Cervix w/o lesions, not friable, GC/chlam and wet prep obtained and sent to lab.  Bimanual exam w/o CMT, uterine or left adnexal tenderness, +very tender right adnexal area.  Pt initially given PO pain meds, now tearful and c/o increasing pain. IM dilaudid ordered.  No IUD on [place], but right ovarian  torsion. T/C returned from OB/GYN Dr. Despina Hidden, case discussed, including:  HPI, pertinent PM/SHx, VS/PE, dx testing, ED course and treatment:  He has viewed the [place] images, will come to the ED for evaluation.        Final Clinical Impressions(s) / ED Diagnoses   Final diagnoses:  Pelvic pain  Pelvic pain    ED Discharge Orders    None       Samuel Jester, DO 03/03/18 1454

## 2018-03-01 NOTE — Op Note (Signed)
Preoperative diagnosis: Right ovarian torsion                                         Right lower quadrant pain  Postop diagnosis: Ruptured  hemorrhagic corpus luteum cyst of the right ovary                                   300 cc of hemoperitoneum                                  Unsure if there was indeed complete interruption of the blood supply of the right tube and                                   Ovary  Procedure: Exploratory mini laparotomy with right salpingo-oophorectomy and evacuation of hemoperitoneum  Surgeon:  Lazaro ArmsLuther H Eure, MD   Anesthesia: General endotracheal  Findings: Upon entering the peritoneal cavity there was basically the entire cul-de-sac was filled with clotted blood proximal 300 cc in total.  It was engulfing the uterus and both ovaries and tubes.  Once I got it cleaned up and evacuated I was able to appreciate what appeared to be a ruptured hemorrhagic corpus luteum of the right ovary that probably bled due to the patient's anticoagulation status until he got enough pressure on it to stop from the coagulated blood.  The fallopian tube and ovary were edematous and I was unsure definitively if indeed there was an interruption in the blood supply however I felt as if I had to go by the ultrasound findings of no flow to the ovary and of course tube.  The left tube and ovary were normal and the uterus also appeared to be normal There were no the other intraperitoneal abnormalities noted  DeScription of operation: Patient taken to operating room placed in the supine position where she underwent general endotracheal anesthesia She was in supine position and remained in that position throughout the case She was prepped and draped in usual sterile fashion I purposely made a very low incision just above the pubic bone because of her significant central obesity It was 8 cm in length total so was a mini laparotomy incision Incision was carried down to the fascia which was  scored in midline and extended laterally The muscles were divided and the peritoneal cavity was entered without difficulty Upon entry the large amount of hemoperitoneum was obvious and basically engulf the entire pelvic structures Took me 5 minutes or so to evacuate the clotted blood which was a hemoperitoneum It was no active bleeding ongoing at this point I was able to evaluate the uterus and total as well as the left tube and ovary and they all appeared to be normal There were no other intraperitoneal abnormalities The right ovary had a hemorrhagic corpus luteum that had ruptured and I think was a source of the hemoperitoneum Again at this point I was unsure whether or not the blood supply to the ovary and tube had been embarrassed but I was unwilling to take the chance due to the patient's significant pain I made an avascular window in the the utero-ovarian ligament and crossclamped the  infundibulopelvic ligament and the utero-ovarian ligament separately The ovary and tube were removed Fore and aft double suture ligation was performed and all pedicles There was good hemostasis of all pedicles Thus the right tube and ovary removed Because of the patient's anticoagulated status I reviewed the surgical field and vascular pedicles for some time and confirmed hemostasis Again because of her anticoagulated status I used Arista in the posterior cul-de-sac and on the pedicles to ensure postoperative hemostasis All sponge instrument needle counts were correct at this point The muscles and peritoneum reapproximated loosely The fascia was closed using 0 Vicryl running Sub-cutaneous tissue was made hemostatic irrigated and reapproximated loosely using 0 chromic The skin was closed using skin staples again because of the patient's anticoagulated status A honeycomb dressing was placed  The patient tolerated the procedure well Intraoperative blood loss was minimal Pneumoperitoneum was 300 cc  approximately Patient received Ancef 2 g and Toradol 30 mg IV preoperatively prophylactically  She was awakened from anesthesia and taken recovery in good stable condition all counts were correct x3 She will be kept in the hospital overnight and evaluated for discharge tomorrow I will hold her Coumadin dose tonight and give her a dose of Lovenox around noon tomorrow as a bridge back to her Coumadin Plan to keep her on Lovenox for approximately 7 days  Lazaro Arms, MD 03/01/2018 8:08 PM

## 2018-03-01 NOTE — Anesthesia Preprocedure Evaluation (Addendum)
Anesthesia Evaluation  Patient identified by MRN, date of birth, ID band Patient awake    Reviewed: Allergy & Precautions, H&P , NPO status , Patient's Chart, lab work & pertinent test results, reviewed documented beta blocker date and time   Airway Mallampati: II  TM Distance: >3 FB Neck ROM: full    Dental no notable dental hx. (+) Chipped   Pulmonary neg pulmonary ROS, asthma , sleep apnea and Continuous Positive Airway Pressure Ventilation , Current Smoker,    Pulmonary exam normal breath sounds clear to auscultation       Cardiovascular Exercise Tolerance: Good + Peripheral Vascular Disease  negative cardio ROS   Rhythm:regular Rate:Normal     Neuro/Psych negative neurological ROS  negative psych ROS   GI/Hepatic negative GI ROS, Neg liver ROS,   Endo/Other  negative endocrine ROSdiabetes, Well Controlled, Type obesity  Renal/GU negative Renal ROS  negative genitourinary   Musculoskeletal   Abdominal   Peds  Hematology negative hematology ROS (+)   Anesthesia Other Findings H/O DVT, pulm emboli on Coumadin, 10 mg qD Breakfast 1100, 12oz coke 1700 Significantly carried, broken, chipped INR 1.02   Reproductive/Obstetrics negative OB ROS                            Anesthesia Physical Anesthesia Plan  ASA: IV and emergent  Anesthesia Plan: General ETT   Post-op Pain Management:    Induction:   PONV Risk Score and Plan:   Airway Management Planned:   Additional Equipment:   Intra-op Plan:   Post-operative Plan:   Informed Consent: I have reviewed the patients History and Physical, chart, labs and discussed the procedure including the risks, benefits and alternatives for the proposed anesthesia with the patient or authorized representative who has indicated his/her understanding and acceptance.   Dental Advisory Given  Plan Discussed with: CRNA and  Anesthesiologist  Anesthesia Plan Comments:         Anesthesia Quick Evaluation

## 2018-03-01 NOTE — Anesthesia Procedure Notes (Signed)
Procedure Name: Intubation Performed by: Vena Rua, MD Pre-anesthesia Checklist: Patient identified, Emergency Drugs available, Suction available, Patient being monitored and Timeout performed Patient Re-evaluated:Patient Re-evaluated prior to induction Oxygen Delivery Method: Circle system utilized Preoxygenation: Pre-oxygenation with 100% oxygen Induction Type: IV induction, Rapid sequence and Cricoid Pressure applied Ventilation: Mask ventilation without difficulty Laryngoscope Size: Mac, 3 and Glidescope Grade View: Grade II Tube type: Oral Tube size: 7.0 mm Number of attempts: 1 Airway Equipment and Method: Oral airway Placement Confirmation: ETT inserted through vocal cords under direct vision,  positive ETCO2 and breath sounds checked- equal and bilateral Tube secured with: Tape Dental Injury: Teeth and Oropharynx as per pre-operative assessment  Difficulty Due To: Difficulty was anticipated and Difficult Airway- due to large tongue Future Recommendations: Recommend- induction with short-acting agent, and alternative techniques readily available

## 2018-03-01 NOTE — H&P (Signed)
Preoperative History and Physical  Donna Shileyiffany Apolinar JunesBrandon is a 37 y.o. G8P8 with Patient's last menstrual period was 02/07/2018. admitted for a exploratory laparotomy  With removal of right tube and ovary due to right ovarian torsion.  Pt has had intermittent pain since 02/25/2018 worsening today Sonogram reveals no arterial or venous flow to the right ovary Took her last dose of coumadin last night and she does have an IVC filter inplace  PMH:    Past Medical History:  Diagnosis Date  . Asthma   . Diabetes mellitus without complication (HCC)   . DVT (deep venous thrombosis) (HCC)   . Pulmonary emboli (HCC)   . Sleep apnea   . Thyroid disease     PSH:     Past Surgical History:  Procedure Laterality Date  . anxiety    . IVC FILTER PLACEMENT ([facility] HX)      POb/GynH:      OB History    Gravida  8   Para  8   Term      Preterm      AB      Living  8     SAB      TAB      Ectopic      Multiple      Live Births              SH:   Social History   Tobacco Use  . Smoking status: Current Every Day Smoker    Packs/day: 1.50    Types: Cigarettes  . Smokeless tobacco: Current User  Substance Use Topics  . Alcohol use: No  . Drug use: Yes    Types: Marijuana    Comment: every now and then    FH:   History reviewed. No pertinent family history.   Allergies: No Known Allergies  Medications:      No current facility-administered medications for this encounter.   Current Outpatient Medications:  .  acetaminophen (TYLENOL) 500 MG tablet, Take 1,000 mg by mouth every 6 (six) hours as needed for mild pain., Disp: , Rfl:  .  albuterol (VENTOLIN HFA) 108 (90 Base) MCG/ACT inhaler, Inhale 2 puffs into the lungs every 6 (six) hours as needed for wheezing or shortness of breath., Disp: 1 Inhaler, Rfl: 0 .  azelastine (ASTELIN) 0.1 % nasal spray, Place 2 sprays into both nostrils 2 (two) times daily., Disp: , Rfl: 2 .  cetirizine (ZYRTEC) 10 MG tablet, Take 1 tablet  by mouth daily as needed., Disp: , Rfl: 2 .  ibuprofen (ADVIL,MOTRIN) 600 MG tablet, Take 1 tablet by mouth every 6 (six) hours as needed., Disp: , Rfl: 0 .  levonorgestrel (MIRENA) 20 MCG/24HR IUD, 1 each by Intrauterine route once., Disp: , Rfl:  .  losartan (COZAAR) 50 MG tablet, Take 50 mg by mouth daily., Disp: , Rfl: 2 .  ranitidine (ZANTAC) 150 MG tablet, Take 1 tablet by mouth 2 (two) times daily., Disp: , Rfl:  .  warfarin (COUMADIN) 10 MG tablet, Take 10 mg by mouth daily. as directed, Disp: , Rfl: 3 .  ferrous sulfate 325 (65 FE) MG tablet, Take 1 tablet by mouth 3 (three) times daily., Disp: , Rfl:   Review of Systems:   Review of Systems  Constitutional: Negative for fever, chills, weight loss, malaise/fatigue and diaphoresis.  HENT: Negative for hearing loss, ear pain, nosebleeds, congestion, sore throat, neck pain, tinnitus and ear discharge.   Eyes: Negative for blurred vision, double vision,  photophobia, pain, discharge and redness.  Respiratory: Negative for cough, hemoptysis, sputum production, shortness of breath, wheezing and stridor.   Cardiovascular: Negative for chest pain, palpitations, orthopnea, claudication, leg swelling and PND.  Gastrointestinal: Positive for abdominal pain. Negative for heartburn, nausea, vomiting, diarrhea, constipation, blood in stool and melena.  Genitourinary: Negative for dysuria, urgency, frequency, hematuria and flank pain.  Musculoskeletal: Negative for myalgias, back pain, joint pain and falls.  Skin: Negative for itching and rash.  Neurological: Negative for dizziness, tingling, tremors, sensory change, speech change, focal weakness, seizures, loss of consciousness, weakness and headaches.  Endo/Heme/Allergies: Negative for environmental allergies and polydipsia. Does not bruise/bleed easily.  Psychiatric/Behavioral: Negative for depression, suicidal ideas, hallucinations, memory loss and substance abuse. The patient is not  nervous/anxious and does not have insomnia.      PHYSICAL EXAM:  Blood pressure (!) 162/90, pulse 99, temperature 98.9 F (37.2 C), temperature source Oral, resp. rate 19, last menstrual period 02/07/2018, SpO2 98 %.    Vitals reviewed. Constitutional: She is oriented to person, place, and time. She appears well-developed and well-nourished.  HENT:  Head: Normocephalic and atraumatic.  Right Ear: External ear normal.  Left Ear: External ear normal.  Nose: Nose normal.  Mouth/Throat: Oropharynx is clear and moist.  Eyes: Conjunctivae and EOM are normal. Pupils are equal, round, and reactive to light. Right eye exhibits no discharge. Left eye exhibits no discharge. No scleral icterus.  Neck: Normal range of motion. Neck supple. No tracheal deviation present. No thyromegaly present.  Cardiovascular: Normal rate, regular rhythm, normal heart sounds and intact distal pulses.  Exam reveals no gallop and no friction rub.   No murmur heard. Respiratory: Effort normal and breath sounds normal. No respiratory distress. She has no wheezes. She has no rales. She exhibits no tenderness.  GI: Soft. Bowel sounds are normal. She exhibits no distension and no mass. There is tenderness. There is no rebound and no guarding.  Genitourinary:       Vulva is normal without lesions Vagina is pink moist without discharge Cervix normal in appearance  Uterus is normal size, contour, position, consistency, mobility, non-tender on sonogram Adnexa is per sonogram Musculoskeletal: Normal range of motion. She exhibits no edema and no tenderness.  Neurological: She is alert and oriented to person, place, and time. She has normal reflexes. She displays normal reflexes. No cranial nerve deficit. She exhibits normal muscle tone. Coordination normal.  Skin: Skin is warm and dry. No rash noted. No erythema. No pallor.  Psychiatric: She has a normal mood and affect. Her behavior is normal. Judgment and thought content  normal.    Labs: Results for orders placed or performed during the hospital encounter of 03/01/18 (from the past 336 hour(s))  CBG monitoring, ED   Collection Time: 03/01/18 11:16 AM  Result Value Ref Range   Glucose-Capillary 147 (H) 70 - 99 mg/dL  Urinalysis, Routine w reflex microscopic   Collection Time: 03/01/18 11:33 AM  Result Value Ref Range   Color, Urine YELLOW YELLOW   APPearance CLEAR CLEAR   Specific Gravity, Urine >1.030 (H) 1.005 - 1.030   pH 6.0 5.0 - 8.0   Glucose, UA NEGATIVE NEGATIVE mg/dL   Hgb urine dipstick NEGATIVE NEGATIVE   Bilirubin Urine NEGATIVE NEGATIVE   Ketones, ur NEGATIVE NEGATIVE mg/dL   Protein, ur NEGATIVE NEGATIVE mg/dL   Nitrite NEGATIVE NEGATIVE   Leukocytes, UA NEGATIVE NEGATIVE  CBC with Differential   Collection Time: 03/01/18 12:08 PM  Result Value Ref  Range   WBC 5.5 4.0 - 10.5 K/uL   RBC 4.05 3.87 - 5.11 MIL/uL   Hemoglobin 9.3 (L) 12.0 - 15.0 g/dL   HCT 02.7 (L) 25.3 - 66.4 %   MCV 74.8 (L) 78.0 - 100.0 fL   MCH 23.0 (L) 26.0 - 34.0 pg   MCHC 30.7 30.0 - 36.0 g/dL   RDW 40.3 (H) 47.4 - 25.9 %   Platelets 413 (H) 150 - 400 K/uL   Neutrophils Relative % 64 %   Neutro Abs 3.5 1.7 - 7.7 K/uL   Lymphocytes Relative 25 %   Lymphs Abs 1.4 0.7 - 4.0 K/uL   Monocytes Relative 8 %   Monocytes Absolute 0.4 0.1 - 1.0 K/uL   Eosinophils Relative 3 %   Eosinophils Absolute 0.2 0.0 - 0.7 K/uL   Basophils Relative 0 %   Basophils Absolute 0.0 0.0 - 0.1 K/uL  Basic metabolic panel   Collection Time: 03/01/18 12:08 PM  Result Value Ref Range   Sodium 139 135 - 145 mmol/L   Potassium 3.5 3.5 - 5.1 mmol/L   Chloride 108 98 - 111 mmol/L   CO2 25 22 - 32 mmol/L   Glucose, Bld 118 (H) 70 - 99 mg/dL   BUN 15 6 - 20 mg/dL   Creatinine, Ser 5.63 0.44 - 1.00 mg/dL   Calcium 8.6 (L) 8.9 - 10.3 mg/dL   GFR calc non Af Amer >60 >60 mL/min   GFR calc Af Amer >60 >60 mL/min   Anion gap 6 5 - 15  Pregnancy, urine   Collection Time: 03/01/18   1:20 PM  Result Value Ref Range   Preg Test, Ur NEGATIVE NEGATIVE  Wet prep, genital   Collection Time: 03/01/18  1:40 PM  Result Value Ref Range   Yeast Wet Prep HPF POC NONE SEEN NONE SEEN   Trich, Wet Prep NONE SEEN NONE SEEN   Clue Cells Wet Prep HPF POC NONE SEEN NONE SEEN   WBC, Wet Prep HPF POC FEW (A) NONE SEEN   Sperm NONE SEEN     EKG: Orders placed or performed during the hospital encounter of 04/15/17  . EKG 12-Lead  . EKG 12-Lead  . ED EKG within 10 minutes  . ED EKG within 10 minutes  . EKG    Imaging Studies: US Pelvis Transvanginal Non-ob (tv Only)  Result Date: 03/01/2018 CLINICAL DATA:  Pelvic pain 4 days EXAM: TRANSABDOMINAL AND TRANSVAGINAL ULTRASOUND OF PELVIS DOPPLER ULTRASOUND OF OVARIES TECHNIQUE: Both transabdominal and transvaginal ultrasound examinations of the pelvis were performed. Transabdominal technique was performed for global imaging of the pelvis including uterus, ovaries, adnexal regions, and pelvic cul-de-sac. It was necessary to proceed with endovaginal exam following the transabdominal exam to visualize the endometrium and ovaries. Color and duplex Doppler ultrasound was utilized to evaluate blood flow to the ovaries. COMPARISON:  None. FINDINGS: Uterus Measurements: 12 x 6.3 x 6 cm. No fibroids or other mass visualized. No intrauterine device is identified. Endometrium Thickness: 12 mm.  No focal abnormality visualized. Right ovary Measurements: 10.2 x 3.8 x 7.3 cm.  Enlarged.  No adnexal mass. Left ovary Measurements: 2.4 x 2.2 x 2.1 cm. Normal appearance/no adnexal mass. Pulsed Doppler evaluation of both ovaries demonstrates: Normal low-resistance arterial and venous waveforms in the left ovary. No arterial or venous waveforms in the right ovary. Other findings No abnormal free fluid. IMPRESSION: 1. Enlarged right ovary without internal Doppler flow most concerning for ovarian torsion. Critical Value/emergent results were  called by telephone at the  time of interpretation on 03/01/2018 at 3:11 pm to Dr. Samuel Jester , who verbally acknowledged these results. Electronically Signed   By: Elige Ko   On: 03/01/2018 15:12   US Pelvis Complete  Result Date: 03/01/2018 CLINICAL DATA:  Pelvic pain 4 days EXAM: TRANSABDOMINAL AND TRANSVAGINAL ULTRASOUND OF PELVIS DOPPLER ULTRASOUND OF OVARIES TECHNIQUE: Both transabdominal and transvaginal ultrasound examinations of the pelvis were performed. Transabdominal technique was performed for global imaging of the pelvis including uterus, ovaries, adnexal regions, and pelvic cul-de-sac. It was necessary to proceed with endovaginal exam following the transabdominal exam to visualize the endometrium and ovaries. Color and duplex Doppler ultrasound was utilized to evaluate blood flow to the ovaries. COMPARISON:  None. FINDINGS: Uterus Measurements: 12 x 6.3 x 6 cm. No fibroids or other mass visualized. No intrauterine device is identified. Endometrium Thickness: 12 mm.  No focal abnormality visualized. Right ovary Measurements: 10.2 x 3.8 x 7.3 cm.  Enlarged.  No adnexal mass. Left ovary Measurements: 2.4 x 2.2 x 2.1 cm. Normal appearance/no adnexal mass. Pulsed Doppler evaluation of both ovaries demonstrates: Normal low-resistance arterial and venous waveforms in the left ovary. No arterial or venous waveforms in the right ovary. Other findings No abnormal free fluid. IMPRESSION: 1. Enlarged right ovary without internal Doppler flow most concerning for ovarian torsion. Critical Value/emergent results were called by telephone at the time of interpretation on 03/01/2018 at 3:11 pm to Dr. Samuel Jester , who verbally acknowledged these results. Electronically Signed   By: Elige Ko   On: 03/01/2018 15:12   US Pelvic Doppler (torsion R/o Or Mass Arterial Flow)  Result Date: 03/01/2018 CLINICAL DATA:  Pelvic pain 4 days EXAM: TRANSABDOMINAL AND TRANSVAGINAL ULTRASOUND OF PELVIS DOPPLER ULTRASOUND OF OVARIES TECHNIQUE:  Both transabdominal and transvaginal ultrasound examinations of the pelvis were performed. Transabdominal technique was performed for global imaging of the pelvis including uterus, ovaries, adnexal regions, and pelvic cul-de-sac. It was necessary to proceed with endovaginal exam following the transabdominal exam to visualize the endometrium and ovaries. Color and duplex Doppler ultrasound was utilized to evaluate blood flow to the ovaries. COMPARISON:  None. FINDINGS: Uterus Measurements: 12 x 6.3 x 6 cm. No fibroids or other mass visualized. No intrauterine device is identified. Endometrium Thickness: 12 mm.  No focal abnormality visualized. Right ovary Measurements: 10.2 x 3.8 x 7.3 cm.  Enlarged.  No adnexal mass. Left ovary Measurements: 2.4 x 2.2 x 2.1 cm. Normal appearance/no adnexal mass. Pulsed Doppler evaluation of both ovaries demonstrates: Normal low-resistance arterial and venous waveforms in the left ovary. No arterial or venous waveforms in the right ovary. Other findings No abnormal free fluid. IMPRESSION: 1. Enlarged right ovary without internal Doppler flow most concerning for ovarian torsion. Critical Value/emergent results were called by telephone at the time of interpretation on 03/01/2018 at 3:11 pm to Dr. Samuel Jester , who verbally acknowledged these results. Electronically Signed   By: Elige Ko   On: 03/01/2018 15:12      Assessment: Right ovarian torsion demonstrated by sonogram Right lower quadrant pain    Plan: Patient has a rather significant pannus which I believe will make laparoscopic surgery impossible, will opt for a mini laparotomy with removal of right tube and ovary  Patient's significant thromboembolic history is noted  Patient is aware her biggest post op complication will be related to TE history  I will manage her with lovenox post op as a bridge back to her coumadin  Lazaro Arms 03/01/2018 5:38 PM

## 2018-03-01 NOTE — ED Notes (Signed)
Spoke with EDP and advised him pt wanted IUD out. I advised pt per EDP that he will not take IUD out but will still give assessment of her abd pain. Pt agreed, stated ob wanted US to make sure IUD was in place. Pt already has another apt with ob this Friday.

## 2018-03-01 NOTE — ED Triage Notes (Signed)
Pt c/o lower abd pain x 4 days. Pt states she thinks it is her IUD which was placed in February. Pt states had IUD years ago and couldn't tolerate it. Denies vag d/c/n/v. C/o diarrhea x 3 since Friday.

## 2018-03-01 NOTE — Addendum Note (Signed)
Addendum  created 03/01/18 2025 by Despina HiddenIdacavage, Arthuro Canelo J, CRNA   Intraprocedure Meds edited

## 2018-03-01 NOTE — Anesthesia Postprocedure Evaluation (Signed)
Anesthesia Post Note  Patient: Donna Douglas  Procedure(s) Performed: LAPARATOMY WITH SALPINGO OOPHORECTOMY RIGHT SIDE (Right )  Patient location during evaluation: PACU Anesthesia Type: General Level of consciousness: awake     Last Vitals:  Vitals:   03/01/18 1823 03/01/18 1830  BP: 132/74 (!) 140/91  Pulse: 71 79  Resp: 20 17  Temp: 36.6 C   SpO2: 99% 100%    Last Pain:  Vitals:   03/01/18 1823  TempSrc: Oral  PainSc: 8                  Candis SchatzBenjamin G Masahiro Iglesia

## 2018-03-02 ENCOUNTER — Other Ambulatory Visit: Payer: Self-pay

## 2018-03-02 LAB — CBC
HCT: 27.9 % — ABNORMAL LOW (ref 36.0–46.0)
Hemoglobin: 8.5 g/dL — ABNORMAL LOW (ref 12.0–15.0)
MCH: 23.1 pg — ABNORMAL LOW (ref 26.0–34.0)
MCHC: 30.5 g/dL (ref 30.0–36.0)
MCV: 75.8 fL — ABNORMAL LOW (ref 78.0–100.0)
Platelets: 368 10*3/uL (ref 150–400)
RBC: 3.68 MIL/uL — ABNORMAL LOW (ref 3.87–5.11)
RDW: 18.1 % — ABNORMAL HIGH (ref 11.5–15.5)
WBC: 8.9 10*3/uL (ref 4.0–10.5)

## 2018-03-02 LAB — GC/CHLAMYDIA PROBE AMP (~~LOC~~) NOT AT ARMC
Chlamydia: NEGATIVE
Neisseria Gonorrhea: NEGATIVE

## 2018-03-02 LAB — GLUCOSE, CAPILLARY: Glucose-Capillary: 120 mg/dL — ABNORMAL HIGH (ref 70–99)

## 2018-03-02 MED ORDER — OXYCODONE-ACETAMINOPHEN 7.5-325 MG PO TABS
1.0000 | ORAL_TABLET | ORAL | Status: DC | PRN
  Administered 2018-03-02 (×3): 2 via ORAL
  Filled 2018-03-02 (×3): qty 2

## 2018-03-02 MED ORDER — OXYCODONE-ACETAMINOPHEN 7.5-325 MG PO TABS: 1.0000 | ORAL_TABLET | ORAL | 0 refills | Status: DC | PRN

## 2018-03-02 MED ORDER — PROMETHAZINE HCL 25 MG PO TABS: 25.0000 mg | ORAL_TABLET | Freq: Four times a day (QID) | ORAL | 1 refills | Status: DC | PRN

## 2018-03-02 MED ORDER — KETOROLAC TROMETHAMINE 10 MG PO TABS: 10.0000 mg | ORAL_TABLET | Freq: Three times a day (TID) | ORAL | 0 refills | Status: DC | PRN

## 2018-03-02 NOTE — Progress Notes (Signed)
Removed patients foley per order. Patient tolerted well. Informed patient that she would need to try and void on her own by 1300.

## 2018-03-02 NOTE — Discharge Instructions (Signed)
Unilateral Salpingo-Oophorectomy, Care After °Refer to this sheet in the next few weeks. These instructions provide you with information on caring for yourself after your procedure. Your health care provider may also give you more specific instructions. Your treatment has been planned according to current medical practices, but problems sometimes occur. Call your health care provider if you have any problems or questions after your procedure. °What can I expect after the procedure? °After the procedure, it is typical to have the following: °· Abdominal pain that can be controlled with pain medicine. °· Vaginal spotting. °· Constipation. ° °Follow these instructions at home: °· Get plenty of rest and sleep. °· Only take over-the-counter or prescription medicines as directed by your health care provider. Do not take aspirin. It can cause bleeding. °· Keep incision areas clean and dry. Remove or change any bandages (dressings) only as directed by your health care provider. °· Follow your health care provider's advice regarding diet. °· Drink enough fluids to keep your urine clear or pale yellow. °· Limit exercise and activities as directed by your health care provider. Do not lift anything heavier than 5 pounds (2.3 kg) until your health care provider approves. °· Do not drive until your health care provider approves. °· Do not drink alcohol until your health care provider approves. °· Do not have sexual intercourse until your health care provider says it is OK. °· Take your temperature twice a day and write it down. °· If you become constipated, you may: °? Ask your health care provider about taking a mild laxative. °? Add more fruit and bran to your diet. °? Drink more fluids. °· Follow up with your health care provider as directed. °Contact a health care provider if: °· You have swelling or redness in the incision area. °· You develop a rash. °· You feel lightheaded. °· You have pain that is not controlled with  medicine. °· You have pain, swelling, or redness where the IV access tube was placed. °Get help right away if: °· You have a fever. °· You develop increasing abdominal pain. °· You see pus coming out of the incision, or the incision is separating. °· You notice a bad smell coming from the wound or dressing. °· You have excessive vaginal bleeding. °· You feel sick to your stomach (nauseous) and vomit. °· You have leg or chest pain. °· You have pain when you urinate. °· You develop shortness of breath. °· You pass out. °This information is not intended to replace advice given to you by your health care provider. Make sure you discuss any questions you have with your health care provider. °Document Released: 06/13/2009 Document Revised: 07/17/2016 Document Reviewed: 02/08/2013 °Elsevier Interactive Patient Education © 2018 [employer] ° °

## 2018-03-02 NOTE — Progress Notes (Signed)
Patient states understanding of discharge instructions.  

## 2018-03-02 NOTE — Anesthesia Postprocedure Evaluation (Signed)
Anesthesia Post Note  Patient: Donna Douglas  Procedure(s) Performed: LAPARATOMY WITH SALPINGO OOPHORECTOMY RIGHT SIDE (Right )  Patient location during evaluation: Nursing Unit Anesthesia Type: General Level of consciousness: awake and alert and patient cooperative Pain management: pain level controlled Vital Signs Assessment: post-procedure vital signs reviewed and stable Respiratory status: spontaneous breathing, nonlabored ventilation and respiratory function stable Cardiovascular status: blood pressure returned to baseline Postop Assessment: no apparent nausea or vomiting Anesthetic complications: no     Last Vitals:  Vitals:   03/02/18 0500 03/02/18 0739  BP: 122/72   Pulse: 70   Resp: 19   Temp: 36.8 C   SpO2: 98% 99%    Last Pain:  Vitals:   03/02/18 0857  TempSrc:   PainSc: 0-No pain                 Jaesean Litzau J

## 2018-03-02 NOTE — Discharge Summary (Signed)
Physician Discharge Summary  Patient ID: Donna Douglas MRN: [id number] DOB/AGE: Jul 19, 1981 37 y.o.  Admit date: 03/01/2018 Discharge date: 03/02/2018  Admission Diagnoses: Ovarian torsion Discharge Diagnoses:  Active Problems:   Pelvic pain in female   Torsion of right ovary and ovarian pedicle   Hemoperitoneum, nontraumatic   S/P right oophorectomy   Discharged Condition: good  Hospital Course: unremarkable post op course  Consults: None  Significant Diagnostic Studies: labs:   Treatments: surgery: exp lap with RSO  Discharge Exam: Blood pressure 122/72, pulse 70, temperature 98.2 F (36.8 C), temperature source Oral, resp. rate 19, height 5\' 5"  (1.651 m), weight 268 lb (121.6 kg), last menstrual period 02/07/2018, SpO2 99 %. General appearance: alert, cooperative and no distress GI: soft, non-tender; bowel sounds normal; no masses,  no organomegaly Incision/Wound:clean dry intact  Disposition: Discharge disposition: 01-Home or Self Care       Discharge Instructions    Call MD for:  persistant nausea and vomiting   Complete by:  As directed    Call MD for:  severe uncontrolled pain   Complete by:  As directed    Call MD for:  temperature >100.4   Complete by:  As directed    Diet - low sodium heart healthy   Complete by:  As directed    Driving Restrictions   Complete by:  As directed    No driving for 1 week   Increase activity slowly   Complete by:  As directed    Leave dressing on - Keep it clean, dry, and intact until clinic visit   Complete by:  As directed    Lifting restrictions   Complete by:  As directed    Do not lift more than 10 pounds for 6 weeks   Sexual Activity Restrictions   Complete by:  As directed    No sex for 3 weeks     Allergies as of 03/02/2018   No Known Allergies     Medication List    STOP taking these medications   ferrous sulfate 325 (65 FE) MG tablet   ibuprofen 600 MG tablet Commonly known as:  ADVIL,MOTRIN      TAKE these medications   acetaminophen 500 MG tablet Commonly known as:  TYLENOL Take 1,000 mg by mouth every 6 (six) hours as needed for mild pain.   albuterol 108 (90 Base) MCG/ACT inhaler Commonly known as:  VENTOLIN HFA Inhale 2 puffs into the lungs every 6 (six) hours as needed for wheezing or shortness of breath.   azelastine 0.1 % nasal spray Commonly known as:  ASTELIN Place 2 sprays into both nostrils 2 (two) times daily.   cetirizine 10 MG tablet Commonly known as:  ZYRTEC Take 1 tablet by mouth daily as needed.   ketorolac 10 MG tablet Commonly known as:  TORADOL Take 1 tablet (10 mg total) by mouth every 8 (eight) hours as needed.   levonorgestrel 20 MCG/24HR IUD Commonly known as:  MIRENA 1 each by Intrauterine route once.   losartan 50 MG tablet Commonly known as:  COZAAR Take 50 mg by mouth daily.   oxyCODONE-acetaminophen 7.5-325 MG tablet Commonly known as:  PERCOCET Take 1-2 tablets by mouth every 4 (four) hours as needed for moderate pain.   promethazine 25 MG tablet Commonly known as:  PHENERGAN Take 1 tablet (25 mg total) by mouth every 6 (six) hours as needed for nausea.   ranitidine 150 MG tablet Commonly known as:  ZANTAC Take 1 tablet  by mouth 2 (two) times daily.   warfarin 10 MG tablet Commonly known as:  COUMADIN Take 10 mg by mouth daily. as directed      Follow-up Information    Lazaro Arms, MD Follow up on 03/08/2018.   Specialties:  Obstetrics and Gynecology, Radiology Why:  post op, pt needs to call [phone] to schedule post op appointment Contact information: [address] [place] [address] [phone]           Signed: Lazaro Arms 03/02/2018, 2:00 PM

## 2018-03-02 NOTE — Addendum Note (Signed)
Addendum  created 03/02/18 0946 by Despina HiddenIdacavage, Brayln Duque J, CRNA   Sign clinical note

## 2018-03-04 ENCOUNTER — Encounter (HOSPITAL_COMMUNITY): Payer: Self-pay | Admitting: Obstetrics & Gynecology

## 2018-03-08 ENCOUNTER — Encounter: Payer: Self-pay | Admitting: Obstetrics & Gynecology

## 2018-03-08 ENCOUNTER — Ambulatory Visit (INDEPENDENT_AMBULATORY_CARE_PROVIDER_SITE_OTHER): Payer: Self-pay | Admitting: Obstetrics & Gynecology

## 2018-03-08 VITALS — BP 135/90 | HR 82 | Ht 65.0 in | Wt 269.0 lb

## 2018-03-08 DIAGNOSIS — Z9889 Other specified postprocedural states: Secondary | ICD-10-CM

## 2018-03-08 MED ORDER — OXYCODONE-ACETAMINOPHEN 7.5-325 MG PO TABS: 1.0000 | ORAL_TABLET | ORAL | 0 refills | Status: DC | PRN

## 2018-03-08 NOTE — Progress Notes (Signed)
  HPI: Patient returns for routine postoperative follow-up having undergone exploratory laparotomy RSO for right ovarian torsion diagnosed on sonogram pre op with ruptured hemorrhagic cyst with hemoperitoneum on 03/01/2018.  The patient's immediate postoperative recovery has been unremarkable. Since hospital discharge the patient reports post op pain in incision.   Current Outpatient Medications: albuterol (VENTOLIN HFA) 108 (90 Base) MCG/ACT inhaler, Inhale 2 puffs into the lungs every 6 (six) hours as needed for wheezing or shortness of breath., Disp: 1 Inhaler, Rfl: 0 azelastine (ASTELIN) 0.1 % nasal spray, Place 2 sprays into both nostrils 2 (two) times daily., Disp: , Rfl: 2 ketorolac (TORADOL) 10 MG tablet, Take 1 tablet (10 mg total) by mouth every 8 (eight) hours as needed., Disp: 15 tablet, Rfl: 0 oxyCODONE-acetaminophen (PERCOCET) 7.5-325 MG tablet, Take 1-2 tablets by mouth every 4 (four) hours as needed for moderate pain., Disp: 30 tablet, Rfl: 0 promethazine (PHENERGAN) 25 MG tablet, Take 1 tablet (25 mg total) by mouth every 6 (six) hours as needed for nausea., Disp: 30 tablet, Rfl: 1 warfarin (COUMADIN) 10 MG tablet, Take 10 mg by mouth daily. as directed, Disp: , Rfl: 3 acetaminophen (TYLENOL) 500 MG tablet, Take 1,000 mg by mouth every 6 (six) hours as needed for mild pain., Disp: , Rfl:  cetirizine (ZYRTEC) 10 MG tablet, Take 1 tablet by mouth daily as needed., Disp: , Rfl: 2 levonorgestrel (MIRENA) 20 MCG/24HR IUD, 1 each by Intrauterine route once., Disp: , Rfl:  losartan (COZAAR) 50 MG tablet, Take 50 mg by mouth daily., Disp: , Rfl: 2 ranitidine (ZANTAC) 150 MG tablet, Take 1 tablet by mouth 2 (two) times daily., Disp: , Rfl:   No current facility-administered medications for this visit.     Blood pressure 135/90, pulse 82, height 5\' 5"  (1.651 m), weight 269 lb (122 kg), last menstrual period 03/03/2018.  Physical Exam: Incision with minimal hematoma presnet, pt was kept  on her anticoagulation post op Incision healing well Irrigated incision with 1/2 h2o2 and water with resolution of hematoma  Diagnostic Tests:   Pathology: benign  Impression: S/p ex lap with RSO Post op minimal hematoma in an anti coagulated patient  Plan: Re eval incision in 1 week  Follow up: 1  weeks  Lazaro ArmsLuther H Ethelyne Erich, MD

## 2018-03-13 ENCOUNTER — Encounter (HOSPITAL_COMMUNITY): Payer: Self-pay

## 2018-03-13 ENCOUNTER — Emergency Department (HOSPITAL_COMMUNITY)
Admission: EM | Admit: 2018-03-13 | Discharge: 2018-03-13 | Disposition: A | Discharge status: Home / Self Care | Payer: Medicaid - Out of State | Attending: Emergency Medicine | Admitting: Emergency Medicine

## 2018-03-13 ENCOUNTER — Other Ambulatory Visit: Payer: Self-pay

## 2018-03-13 DIAGNOSIS — J45909 Unspecified asthma, uncomplicated: Secondary | ICD-10-CM | POA: Diagnosis not present

## 2018-03-13 DIAGNOSIS — F17210 Nicotine dependence, cigarettes, uncomplicated: Secondary | ICD-10-CM | POA: Insufficient documentation

## 2018-03-13 DIAGNOSIS — M79652 Pain in left thigh: Secondary | ICD-10-CM | POA: Diagnosis present

## 2018-03-13 DIAGNOSIS — E119 Type 2 diabetes mellitus without complications: Secondary | ICD-10-CM | POA: Insufficient documentation

## 2018-03-13 MED ORDER — OXYCODONE HCL 5 MG PO TABS
10.0000 mg | ORAL_TABLET | Freq: Once | ORAL | Status: AC
  Administered 2018-03-13: 10 mg via ORAL
  Filled 2018-03-13: qty 2

## 2018-03-13 MED ORDER — IBUPROFEN 400 MG PO TABS
400.0000 mg | ORAL_TABLET | Freq: Once | ORAL | Status: AC
  Administered 2018-03-13: 400 mg via ORAL
  Filled 2018-03-13: qty 1

## 2018-03-13 MED ORDER — OXYCODONE HCL 5 MG PO TABS: 5.0000 mg | ORAL_TABLET | Freq: Four times a day (QID) | ORAL | 0 refills | Status: DC | PRN

## 2018-03-13 MED ORDER — IBUPROFEN 400 MG PO TABS: 400.0000 mg | ORAL_TABLET | Freq: Four times a day (QID) | ORAL | 0 refills | Status: DC | PRN

## 2018-03-13 NOTE — ED Provider Notes (Signed)
Emergency Department Provider Note   I have reviewed the triage vital signs and the nursing notes.   HISTORY  Chief Complaint Groin Pain   HPI Donna Douglas is a 37 y.o. female with medical problems as documented below the presents to the emergency department today with left groin pain.  Patient states that this happened every time she moves a certain way or stands up and turns a certain way it is not there all the time.  She has history of blood clots and states this feels nothing like that at all.  She is noticed no leg redness, swelling or distal pain.  She did recently have a surgery which was a laparotomy with a right oophorectomy but states that this pain started a couple days ago her as her surgery was a couple weeks ago.  No fevers.  She states is a sharp pain feels like something pulling and moving around.  She was worried that her IVC filter had dislodged from her IVC and to her leg. No other associated or modifying symptoms.    Past Medical History:  Diagnosis Date  . Asthma   . Diabetes mellitus without complication (HCC)   . DVT (deep venous thrombosis) (HCC)   . Pulmonary emboli (HCC)   . Sleep apnea   . Thyroid disease     Patient Active Problem List   Diagnosis Date Noted  . S/P right oophorectomy 03/01/2018  . Pelvic pain in female   . Torsion of right ovary and ovarian pedicle   . Hemoperitoneum, nontraumatic   . [redacted] weeks gestation of pregnancy   . Pleurisy   . Chest pain 04/16/2017  . Intrauterine pregnancy 04/16/2017  . 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 DM (diabetes mellitus) (HCC) 03/23/2016  . Thyroid condition 03/23/2016  . Pulmonary emboli (HCC) 01/05/2016  . Bilateral pulmonary embolism (HCC) 01/05/2016    Past Surgical History:  Procedure Laterality Date  . anxiety    . IVC FILTER PLACEMENT ([facility] HX)    .  SALPINGOOPHORECTOMY Right 03/01/2018   Procedure: LAPARATOMY WITH SALPINGO OOPHORECTOMY RIGHT SIDE;  Surgeon: Lazaro Arms, MD;  Location: [facility];  Service: Gynecology;  Laterality: Right;    Current Outpatient Rx  . Order #: [id number] Class: Print  . Order #: [id number] Class: Historical Med  . Order #: [id number] Class: Normal  . Order #: [id number] Class: Normal  . Order #: [id number] Class: Normal  . Order #: [id number] Class: Historical Med  . Order #: [id number] Class: Historical Med  . Order #: [id number] Class: Historical Med  . Order #: [id number] Class: Print  . Order #: [id number] Class: Print    Allergies Patient has no known allergies.  Family History  Problem Relation Age of Onset  . Asthma Father   . Diabetes Mother   . Hypertension Mother     Social History Social History   Tobacco Use  . Smoking status: Current Every Day Smoker    Packs/day: 1.50    Types: Cigarettes  . Smokeless tobacco: Current User  Substance Use Topics  . Alcohol use: No  . Drug use: Yes    Types: Marijuana    Comment: every now and then    Review of Systems  All other systems negative except as documented in the HPI. All pertinent positives and negatives as reviewed in the HPI. ____________________________________________   PHYSICAL EXAM:  VITAL SIGNS: ED Triage Vitals  Enc  Vitals Group     BP 03/13/18 1604 (!) 143/97     Pulse Rate 03/13/18 1604 91     Resp 03/13/18 1604 18     Temp 03/13/18 1604 97.8 F (36.6 C)     Temp Source 03/13/18 1604 Oral     SpO2 03/13/18 1604 97 %     Weight 03/13/18 1603 268 lb (121.6 kg)     Height 03/13/18 1603 5\' 5"  (1.651 m)     Head Circumference --      Peak Flow --      Pain Score 03/13/18 1603 10     Pain Loc --      Pain Edu? --      Excl. in GC? --     Constitutional: Alert and oriented. Well appearing and in no acute distress. Eyes: Conjunctivae are normal. PERRL. EOMI. Head: Atraumatic. Nose: No congestion/rhinnorhea. Mouth/Throat:  Mucous membranes are moist.  Oropharynx non-erythematous. Neck: No stridor.  No meningeal signs.   Cardiovascular: Normal rate, regular rhythm. Good peripheral circulation. Grossly normal heart sounds.   Respiratory: Normal respiratory effort.  No retractions. Lungs CTAB. Gastrointestinal: Soft and nontender. No distention.  Musculoskeletal: No lower extremity tenderness nor edema. No gross deformities of extremities. No pelvic ttp. Pain with outward rotation of her left hip.  Neurologic:  Normal speech and language. No gross focal neurologic deficits are appreciated.  Skin:  Skin is warm, dry and intact. No rash noted.  ____________________________________________   INITIAL IMPRESSION / ASSESSMENT AND PLAN / ED COURSE  Suspect MSK cause.  No red flags for anterior pelvic or intra-abdominal problems.  Considered DVT and offered ultrasound but she states she does not feel like it is that she has had multiple of those in the past and is compliant with her medications.  Will treat as muscle skeletal injury with ice, heat, pain medication and anti-inflammatories with stretching exercises.  PCP follow-up as needed.     Pertinent labs & imaging results that were available during my care of the patient were reviewed by me and considered in my medical decision making (see chart for details).  ____________________________________________  FINAL CLINICAL IMPRESSION(S) / ED DIAGNOSES  Final diagnoses:  Left thigh pain     MEDICATIONS GIVEN DURING THIS VISIT:  Medications  oxyCODONE (Oxy IR/ROXICODONE) immediate release tablet 10 mg (has no administration in time range)  ibuprofen (ADVIL,MOTRIN) tablet 400 mg (has no administration in time range)     NEW OUTPATIENT MEDICATIONS STARTED DURING THIS VISIT:  New Prescriptions   IBUPROFEN (ADVIL,MOTRIN) 400 MG TABLET    Take 1 tablet (400 mg total) by mouth every 6 (six) hours as needed.   OXYCODONE (ROXICODONE) 5 MG IMMEDIATE RELEASE TABLET     Take 1-2 tablets (5-10 mg total) by mouth every 6 (six) hours as needed for severe pain or breakthrough pain.    Note:  This note was prepared with assistance of Dragon voice recognition software. Occasional wrong-word or sound-a-like substitutions may have occurred due to the inherent limitations of voice recognition software.   Marily MemosMesner, Ashna Dorough, MD 03/13/18 1659

## 2018-03-13 NOTE — ED Triage Notes (Addendum)
Pt reports had ovary removed 2 weeks ago and started having pain in left groin 2 days ago.  Denies leg pain or swelling.  Pt says pain is worse when she walks.  Denies any redness or swelling.   Left leg  warm and dry, pedal pulse present.  Capillary refill wnl.

## 2018-03-14 ENCOUNTER — Telehealth: Payer: Self-pay | Admitting: *Deleted

## 2018-03-14 NOTE — Telephone Encounter (Signed)
Patient informed per Dr Despina HiddenEure she is being actively anti-coagulated anyway so really there is nothing acute that needs to be done even if she were to have  DVT, he will see her tomorrow as scheduled and if anything further needed to be ordered, it could be done then.  Verbalized understanding.

## 2018-03-14 NOTE — Telephone Encounter (Signed)
Pt is being actively anti coagulated anyway so really there is nothing acute that needs to be done even if she were to have  DVT, I will see her tomorrow as scheduled

## 2018-03-14 NOTE — Telephone Encounter (Signed)
Patient states she is in terrible pain with her leg.  Went to the ER yesterday but could not have an U/S of her leg b/c they stated they were not there on Saturdays.  Has an appt tomorrow and wanted to be seen today but informed Dr Despina HiddenEure was out of the office until tomorrow.  Pt is requesting an U/S of her leg.  Please advise.

## 2018-03-15 ENCOUNTER — Encounter: Payer: Self-pay | Admitting: Obstetrics & Gynecology

## 2018-03-15 ENCOUNTER — Other Ambulatory Visit: Payer: Self-pay

## 2018-03-15 ENCOUNTER — Ambulatory Visit (INDEPENDENT_AMBULATORY_CARE_PROVIDER_SITE_OTHER): Payer: Self-pay | Admitting: Obstetrics & Gynecology

## 2018-03-15 VITALS — BP 153/96 | HR 84 | Ht 65.0 in | Wt 266.0 lb

## 2018-03-15 DIAGNOSIS — Z9889 Other specified postprocedural states: Secondary | ICD-10-CM

## 2018-03-15 MED ORDER — HYDROCODONE-ACETAMINOPHEN 5-325 MG PO TABS: 1.0000 | ORAL_TABLET | Freq: Four times a day (QID) | ORAL | 0 refills | Status: DC | PRN

## 2018-03-15 NOTE — Progress Notes (Signed)
  HPI: Patient returns for routine postoperative follow-up having undergone laparotomy for right ovarian torsion had RSO on 03/01/2018.  The patient's immediate postoperative recovery has been unremarkable. Since hospital discharge the patient reports left leg pain in thigh  Exam is negative .   Current Outpatient Medications: albuterol (VENTOLIN HFA) 108 (90 Base) MCG/ACT inhaler, Inhale 2 puffs into the lungs every 6 (six) hours as needed for wheezing or shortness of breath., Disp: 1 Inhaler, Rfl: 0 azelastine (ASTELIN) 0.1 % nasal spray, Place 2 sprays into both nostrils 2 (two) times daily., Disp: , Rfl: 2 cetirizine (ZYRTEC) 10 MG tablet, Take 1 tablet by mouth daily as needed., Disp: , Rfl: 2 ibuprofen (ADVIL,MOTRIN) 400 MG tablet, Take 1 tablet (400 mg total) by mouth every 6 (six) hours as needed., Disp: 30 tablet, Rfl: 0 oxyCODONE-acetaminophen (PERCOCET) 7.5-325 MG tablet, Take 1-2 tablets by mouth every 4 (four) hours as needed for moderate pain., Disp: 30 tablet, Rfl: 0 ranitidine (ZANTAC) 150 MG tablet, Take 1 tablet by mouth as needed. , Disp: , Rfl:  warfarin (COUMADIN) 10 MG tablet, Take 10 mg by mouth daily. as directed, Disp: , Rfl: 3 ketorolac (TORADOL) 10 MG tablet, Take 1 tablet (10 mg total) by mouth every 8 (eight) hours as needed. (Patient not taking: Reported on 03/15/2018), Disp: 15 tablet, Rfl: 0 oxyCODONE (ROXICODONE) 5 MG immediate release tablet, Take 1-2 tablets (5-10 mg total) by mouth every 6 (six) hours as needed for severe pain or breakthrough pain. (Patient not taking: Reported on 03/15/2018), Disp: 30 tablet, Rfl: 0 promethazine (PHENERGAN) 25 MG tablet, Take 1 tablet (25 mg total) by mouth every 6 (six) hours as needed for nausea. (Patient not taking: Reported on 03/15/2018), Disp: 30 tablet, Rfl: 1  No current facility-administered medications for this visit.     Blood pressure (!) 153/96, pulse 84, height 5\' 5"  (1.651 m), weight 266 lb (120.7 kg), last  menstrual period 03/03/2018.  Physical Exam: incision healing great hematoma has completely resolved Left leg no superficial or deep phlebitis or blood clots are appreciated  tenderness  Diagnostic Tests:   Pathology: benign  Impression: S/p rso laparotomy  Plan: Final post op 4 weeks  Follow up: 4  weeks   Meds ordered this encounter  Medications  . HYDROcodone-acetaminophen (NORCO/VICODIN) 5-325 MG tablet    Sig: Take 1 tablet by mouth every 6 (six) hours as needed.    Dispense:  30 tablet    Refill:  0    Lazaro ArmsLuther H Eure, MD

## 2018-03-18 ENCOUNTER — Other Ambulatory Visit: Payer: Self-pay

## 2018-03-18 ENCOUNTER — Encounter (HOSPITAL_COMMUNITY): Payer: Self-pay | Admitting: *Deleted

## 2018-03-18 DIAGNOSIS — J45909 Unspecified asthma, uncomplicated: Secondary | ICD-10-CM | POA: Insufficient documentation

## 2018-03-18 DIAGNOSIS — R079 Chest pain, unspecified: Secondary | ICD-10-CM | POA: Insufficient documentation

## 2018-03-18 DIAGNOSIS — E119 Type 2 diabetes mellitus without complications: Secondary | ICD-10-CM | POA: Diagnosis not present

## 2018-03-18 DIAGNOSIS — Z7901 Long term (current) use of anticoagulants: Secondary | ICD-10-CM | POA: Diagnosis not present

## 2018-03-18 DIAGNOSIS — F17210 Nicotine dependence, cigarettes, uncomplicated: Secondary | ICD-10-CM | POA: Insufficient documentation

## 2018-03-18 DIAGNOSIS — G8918 Other acute postprocedural pain: Secondary | ICD-10-CM | POA: Diagnosis not present

## 2018-03-18 DIAGNOSIS — R1032 Left lower quadrant pain: Secondary | ICD-10-CM | POA: Insufficient documentation

## 2018-03-18 NOTE — ED Triage Notes (Signed)
Pt c/o bilateral leg pain; pt states the pain is worse with walking

## 2018-03-19 ENCOUNTER — Emergency Department (HOSPITAL_COMMUNITY)
Admission: EM | Admit: 2018-03-19 | Discharge: 2018-03-19 | Disposition: A | Discharge status: Home / Self Care | Payer: Medicaid - Out of State | Attending: Emergency Medicine | Admitting: Emergency Medicine

## 2018-03-19 ENCOUNTER — Emergency Department (HOSPITAL_COMMUNITY): Payer: Medicaid - Out of State

## 2018-03-19 DIAGNOSIS — R1032 Left lower quadrant pain: Secondary | ICD-10-CM

## 2018-03-19 LAB — COMPREHENSIVE METABOLIC PANEL
ALT: 13 U/L (ref 0–44)
AST: 12 U/L — ABNORMAL LOW (ref 15–41)
Albumin: 3.6 g/dL (ref 3.5–5.0)
Alkaline Phosphatase: 99 U/L (ref 38–126)
Anion gap: 5 (ref 5–15)
BUN: 15 mg/dL (ref 6–20)
CO2: 28 mmol/L (ref 22–32)
Calcium: 8.9 mg/dL (ref 8.9–10.3)
Chloride: 103 mmol/L (ref 98–111)
Creatinine, Ser: 0.93 mg/dL (ref 0.44–1.00)
GFR calc Af Amer: >60 mL/min (ref >60)
GFR calc non Af Amer: >60 mL/min (ref >60)
Glucose, Bld: 85 mg/dL (ref 70–99)
Potassium: 3.7 mmol/L (ref 3.5–5.1)
Sodium: 136 mmol/L (ref 135–145)
Total Bilirubin: <0.1 mg/dL — ABNORMAL LOW (ref 0.3–1.2)
Total Protein: 7.7 g/dL (ref 6.5–8.1)

## 2018-03-19 LAB — CBC WITH DIFFERENTIAL/PLATELET
Basophils Absolute: 0 10*3/uL (ref 0.0–0.1)
Basophils Relative: 0 %
Eosinophils Absolute: 0.1 10*3/uL (ref 0.0–0.7)
Eosinophils Relative: 3 %
HCT: 32.9 % — ABNORMAL LOW (ref 36.0–46.0)
Hemoglobin: 10 g/dL — ABNORMAL LOW (ref 12.0–15.0)
Lymphocytes Relative: 32 %
Lymphs Abs: 1.7 10*3/uL (ref 0.7–4.0)
MCH: 23.5 pg — ABNORMAL LOW (ref 26.0–34.0)
MCHC: 30.4 g/dL (ref 30.0–36.0)
MCV: 77.2 fL — ABNORMAL LOW (ref 78.0–100.0)
Monocytes Absolute: 0.3 10*3/uL (ref 0.1–1.0)
Monocytes Relative: 6 %
Neutro Abs: 3.2 10*3/uL (ref 1.7–7.7)
Neutrophils Relative %: 59 %
Platelets: 493 10*3/uL — ABNORMAL HIGH (ref 150–400)
RBC: 4.26 MIL/uL (ref 3.87–5.11)
RDW: 18.8 % — ABNORMAL HIGH (ref 11.5–15.5)
WBC: 5.4 10*3/uL (ref 4.0–10.5)

## 2018-03-19 LAB — POC URINE PREG, ED: Preg Test, Ur: NEGATIVE

## 2018-03-19 MED ORDER — KETOROLAC TROMETHAMINE 30 MG/ML IJ SOLN
30.0000 mg | Freq: Once | INTRAMUSCULAR | Status: AC
  Administered 2018-03-19: 30 mg via INTRAVENOUS
  Filled 2018-03-19: qty 1

## 2018-03-19 MED ORDER — METHOCARBAMOL 500 MG PO TABS: ORAL_TABLET | ORAL | 0 refills | Status: DC

## 2018-03-19 MED ORDER — IOPAMIDOL (ISOVUE-370) INJECTION 76%
100.0000 mL | Freq: Once | INTRAVENOUS | Status: AC | PRN
  Administered 2018-03-19: 100 mL via INTRAVENOUS

## 2018-03-19 NOTE — ED Provider Notes (Addendum)
[facility] Provider Note   CSN: [id number] Arrival date & time: 03/18/18  2311  Time seen 01:00 AM   History   Chief Complaint Chief Complaint  Patient presents with  . Leg Pain    HPI Donna Douglas is a 37 y.o. female.  HPI patient states she had surgery on July 2, she had a ovary removed and had a open incision done.  She states she had a bleeding cyst on her ovary.  She states since she had the surgery she has been having trouble walking because she gets pain in her left groin area when she bears weight.  She describes it as a sticking pain.  She only has it when she walks.  She denies any pain in her legs or swelling of her actual legs.  She was seen on July 16 for her follow-up appointment and she was told that it would take 4 to 6 weeks for the muscles to go together and heal well.  She states she had been on Percocet and at that point she was changed to Lortab and she states "they are not strong enough".  She denies fever or any urinary symptoms.  She states she cannot wear underwear because she feels like it presses on the area.  She states that she did have chest pain a few times last night and states it was a sharp pain that lasted a few minutes and her upper central chest and she is been having some shortness of breath that is in proved when she uses her inhaler.  She thinks she may have some wheezing.  She denies cough or fever.  PCP Patient, No Pcp Per GYN Dr Despina HiddenEure  Past Medical History:  Diagnosis Date  . Asthma   . Diabetes mellitus without complication (HCC)   . DVT (deep venous thrombosis) (HCC)   . Pulmonary emboli (HCC)   . Sleep apnea   . Thyroid disease     Patient Active Problem List   Diagnosis Date Noted  . S/P right oophorectomy 03/01/2018  . Pelvic pain in female   . Torsion of right ovary and ovarian pedicle   . Hemoperitoneum, nontraumatic   . [redacted] weeks gestation of pregnancy   . Pleurisy   . Chest pain 04/16/2017  .  Intrauterine pregnancy 04/16/2017  . 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 DM (diabetes mellitus) (HCC) 03/23/2016  . Thyroid condition 03/23/2016  . Pulmonary emboli (HCC) 01/05/2016  . Bilateral pulmonary embolism (HCC) 01/05/2016    Past Surgical History:  Procedure Laterality Date  . anxiety    . IVC FILTER PLACEMENT ([facility] HX)    . SALPINGOOPHORECTOMY Right 03/01/2018   Procedure: LAPARATOMY WITH SALPINGO OOPHORECTOMY RIGHT SIDE;  Surgeon: Lazaro ArmsEure, Luther H, MD;  Location: [facility];  Service: Gynecology;  Laterality: Right;     OB History    Gravida  8   Para  8   Term      Preterm      AB      Living  8     SAB      TAB      Ectopic      Multiple      Live Births               Home Medications    Prior to Admission medications   Medication Sig  Start Date End Date Taking? Authorizing Provider  albuterol (VENTOLIN HFA) 108 (90 Base) MCG/ACT inhaler Inhale 2 puffs into the lungs every 6 (six) hours as needed for wheezing or shortness of breath. 04/17/17   Catarina Hartshorn, MD  azelastine (ASTELIN) 0.1 % nasal spray Place 2 sprays into both nostrils 2 (two) times daily. 01/19/18   [provider]  cetirizine (ZYRTEC) 10 MG tablet Take 1 tablet by mouth daily as needed. 12/10/17   [provider]  HYDROcodone-acetaminophen (NORCO/VICODIN) 5-325 MG tablet Take 1 tablet by mouth every 6 (six) hours as needed. 03/15/18   Lazaro Arms, MD  ibuprofen (ADVIL,MOTRIN) 400 MG tablet Take 1 tablet (400 mg total) by mouth every 6 (six) hours as needed. 03/13/18   Mesner, Barbara Cower, MD  ketorolac (TORADOL) 10 MG tablet Take 1 tablet (10 mg total) by mouth every 8 (eight) hours as needed. Patient not taking: Reported on 03/15/2018 03/02/18   Lazaro Arms, MD  methocarbamol (ROBAXIN) 500 MG tablet Take 1 or 2 po Q 6hrs for pain 03/19/18   Devoria Albe, MD    oxyCODONE (ROXICODONE) 5 MG immediate release tablet Take 1-2 tablets (5-10 mg total) by mouth every 6 (six) hours as needed for severe pain or breakthrough pain. Patient not taking: Reported on 03/15/2018 03/13/18   Mesner, Barbara Cower, MD  oxyCODONE-acetaminophen (PERCOCET) 7.5-325 MG tablet Take 1-2 tablets by mouth every 4 (four) hours as needed for moderate pain. 03/08/18   Lazaro Arms, MD  promethazine (PHENERGAN) 25 MG tablet Take 1 tablet (25 mg total) by mouth every 6 (six) hours as needed for nausea. Patient not taking: Reported on 03/15/2018 03/02/18   Lazaro Arms, MD  ranitidine (ZANTAC) 150 MG tablet Take 1 tablet by mouth as needed.  07/08/17   [provider]  warfarin (COUMADIN) 10 MG tablet Take 10 mg by mouth daily. as directed 01/19/18   [provider]    Family History Family History  Problem Relation Age of Onset  . Asthma Father   . Diabetes Mother   . Hypertension Mother     Social History Social History   Tobacco Use  . Smoking status: Current Every Day Smoker    Packs/day: 1.50    Types: Cigarettes  . Smokeless tobacco: Never Used  Substance Use Topics  . Alcohol use: No  . Drug use: Yes    Types: Marijuana    Comment: every now and then     Allergies   Patient has no known allergies.   Review of Systems Review of Systems  All other systems reviewed and are negative.    Physical Exam Updated Vital Signs BP 126/87 (BP Location: Right Arm)   Pulse 70   Temp 98.4 F (36.9 C) (Oral)   Resp 20   Ht 5\' 5"  (1.651 m)   Wt 120.7 kg (266 lb)   LMP 03/03/2018 Comment: neg u lperg  SpO2 97%   BMI 44.26 kg/m   Physical Exam  Constitutional: She is oriented to person, place, and time. She appears well-developed and well-nourished.  Non-toxic appearance. She does not appear ill. No distress.  HENT:  Head: Normocephalic and atraumatic.  Right Ear: External ear normal.  Left Ear: External ear normal.  Nose: Nose normal. No mucosal  edema or rhinorrhea.  Mouth/Throat: Oropharynx is clear and moist and mucous membranes are normal. No dental abscesses or uvula swelling.  Eyes: Pupils are equal, round, and reactive to light. Conjunctivae and EOM are normal.  Neck: Normal range of motion and full passive range of motion without pain. Neck supple.  Cardiovascular: Normal rate, regular rhythm and normal heart sounds. Exam reveals no gallop and no friction rub.  No murmur heard. Pulmonary/Chest: Effort normal and breath sounds normal. No respiratory distress. She has no wheezes. She has no rhonchi. She has no rales. She exhibits no tenderness and no crepitus.  Abdominal: Soft. Normal appearance and bowel sounds are normal. She exhibits no distension. There is no tenderness. There is no rebound and no guarding.    Well healed surgical scar  Musculoskeletal: Normal range of motion. She exhibits no edema or tenderness.       Legs: Moves all extremities well.   Neurological: She is alert and oriented to person, place, and time. She has normal strength. No cranial nerve deficit.  Skin: Skin is warm, dry and intact. No rash noted. No erythema. No pallor.  Psychiatric: She has a normal mood and affect. Her speech is normal and behavior is normal. Her mood appears not anxious.  Nursing note and vitals reviewed.    ED Treatments / Results  Labs (all labs ordered are listed, but only abnormal results are displayed) Results for orders placed or performed during the hospital encounter of 03/19/18  Comprehensive metabolic panel  Result Value Ref Range   Sodium 136 135 - 145 mmol/L   Potassium 3.7 3.5 - 5.1 mmol/L   Chloride 103 98 - 111 mmol/L   CO2 28 22 - 32 mmol/L   Glucose, Bld 85 70 - 99 mg/dL   BUN 15 6 - 20 mg/dL   Creatinine, Ser 1.61 0.44 - 1.00 mg/dL   Calcium 8.9 8.9 - 09.6 mg/dL   Total Protein 7.7 6.5 - 8.1 g/dL   Albumin 3.6 3.5 - 5.0 g/dL   AST 12 (L) 15 - 41 U/L   ALT 13 0 - 44 U/L   Alkaline Phosphatase 99 38  - 126 U/L   Total Bilirubin <0.1 (L) 0.3 - 1.2 mg/dL   GFR calc non Af Amer >60 >60 mL/min   GFR calc Af Amer >60 >60 mL/min   Anion gap 5 5 - 15  CBC with Differential  Result Value Ref Range   WBC 5.4 4.0 - 10.5 K/uL   RBC 4.26 3.87 - 5.11 MIL/uL   Hemoglobin 10.0 (L) 12.0 - 15.0 g/dL   HCT 04.5 (L) 40.9 - 81.1 %   MCV 77.2 (L) 78.0 - 100.0 fL   MCH 23.5 (L) 26.0 - 34.0 pg   MCHC 30.4 30.0 - 36.0 g/dL   RDW 91.4 (H) 78.2 - 95.6 %   Platelets 493 (H) 150 - 400 K/uL   Neutrophils Relative % 59 %   Neutro Abs 3.2 1.7 - 7.7 K/uL   Lymphocytes Relative 32 %   Lymphs Abs 1.7 0.7 - 4.0 K/uL   Monocytes Relative 6 %   Monocytes Absolute 0.3 0.1 - 1.0 K/uL   Eosinophils Relative 3 %   Eosinophils Absolute 0.1 0.0 - 0.7 K/uL   Basophils Relative 0 %   Basophils Absolute 0.0 0.0 - 0.1 K/uL        EKG None  Radiology Ct Angio Chest Pe W/cm &/or Wo Cm  Ct Abdomen Pelvis W Contrast  Result Date: 03/19/2018 CLINICAL DATA:  37 year old female with bilateral leg pain. EXAM: CT ANGIOGRAPHY CHEST CT ABDOMEN AND PELVIS WITH CONTRAST TECHNIQUE: Multidetector CT imaging of the chest was performed using the standard protocol during bolus administration  of intravenous contrast. Multiplanar CT image reconstructions and MIPs were obtained to evaluate the vascular anatomy. Multidetector CT imaging of the abdomen and pelvis was performed using the standard protocol during bolus administration of intravenous contrast. CONTRAST:  ISOVUE-370 IOPAMIDOL (ISOVUE-370) INJECTION 76% COMPARISON:  CT of the chest abdomen pelvis dated 03/23/2016 FINDINGS: CTA CHEST FINDINGS Cardiovascular: There is no cardiomegaly or pericardial effusion. The thoracic aorta is unremarkable. The visualized origins of the great vessels of the aortic arch appear patent. Evaluation of the pulmonary arteries is limited due to suboptimal opacification and timing of the contrast. No definite pulmonary artery embolus identified.  Mediastinum/Nodes: No hilar or mediastinal adenopathy. Esophagus and the thyroid gland are grossly unremarkable. No mediastinal fluid collection. Lungs/Pleura: Minimal right lung base subpleural atelectasis or scarring. No lobar consolidation. There is no pleural effusion or pneumothorax. The central airways are patent. Musculoskeletal: No chest wall abnormality. No acute or significant osseous findings. Review of the MIP images confirms the above findings. CT ABDOMEN and PELVIS FINDINGS No intra-abdominal free air.  Trace free fluid within the pelvis. Hepatobiliary: A 1 cm right hepatic hypodense lesion is not well characterized but may represent a cyst or hemangioma. The liver is otherwise unremarkable. No intrahepatic biliary ductal dilatation. The gallbladder is unremarkable. Pancreas: Unremarkable. No pancreatic ductal dilatation or surrounding inflammatory changes. Spleen: Normal in size without focal abnormality. Adrenals/Urinary Tract: Adrenal glands are unremarkable. Kidneys are normal, without renal calculi, focal lesion, or hydronephrosis. Bladder is unremarkable. Stomach/Bowel: Stomach is within normal limits. Appendix appears normal. No evidence of bowel wall thickening, distention, or inflammatory changes. Vascular/Lymphatic: The abdominal aorta and IVC appear unremarkable. An infrarenal IVC filter noted. No portal venous gas. There is no adenopathy. Stable top-normal mildly rounded right iliac lymph node measures 12 mm in short axis (series 15, image 72). Reproductive: The uterus is anteverted and grossly unremarkable. A 3.3 cm cystic structure in the posterior pelvis to the right of the midline likely represents a dominant right ovarian follicle. There is mild surrounding stranding and trace fluid within the pelvis which may represent follicle rupture. Ultrasound may provide better evaluation of the pelvic structures. A fluid collection or abscess is less likely. Other: Anterior pelvic wall C-section  scar. Musculoskeletal: Degenerative changes at L5-S1 and SI joints. No acute osseous pathology. Review of the MIP images confirms the above findings. IMPRESSION: 1. No acute intrathoracic pathology. No CT evidence of pulmonary embolism. 2. Probable dominant right ovarian follicle with rupture. Ultrasound may provide better evaluation of the pelvic structures. No other acute intra-abdominopelvic pathology identified. Electronically Signed   By: Elgie Collard M.D.   On: 03/19/2018 05:18   US Pelvis Transvanginal Non-ob (tv Only) US Pelvic Doppler (torsion R/o Or Mass Arterial Flow)  Result Date: 03/01/2018 CLINICAL DATA:  Pelvic pain 4 days  IMPRESSION: 1. Enlarged right ovary without internal Doppler flow most concerning for ovarian torsion. Critical Value/emergent results were called by telephone at the time of interpretation on 03/01/2018 at 3:11 pm to Dr. Samuel Jester , who verbally acknowledged these results. Electronically Signed   By: Elige Ko   On: 03/01/2018 15:12   Procedures .Critical Care Performed by: Devoria Albe, MD Authorized by: Devoria Albe, MD   Critical care provider statement:    Critical care time (minutes):  31   Critical care was necessary to treat or prevent imminent or life-threatening deterioration of the following conditions: post surgical complication.   Critical care was time spent personally by me on the following activities:  Evaluation of patient's response to treatment, examination of patient, obtaining history from patient or surrogate, ordering and review of laboratory studies, ordering and review of radiographic studies, pulse oximetry, re-evaluation of patient's condition and review of old charts   (including critical care time)  Medications Ordered in ED Medications  ketorolac (TORADOL) 30 MG/ML injection 30 mg (30 mg Intravenous Given 03/19/18 0204)  iopamidol (ISOVUE-370) 76 % injection 100 mL (100 mLs Intravenous Contrast Given 03/19/18 0403)      Initial Impression / Assessment and Plan / ED Course  I have reviewed the triage vital signs and the nursing notes.  Pertinent labs & imaging results that were available during my care of the patient were reviewed by me and considered in my medical decision making (see chart for details).     When I review Dr. Forestine Chute notes patient actually had a laparotomy done for right ovarian torsion on July 2.  He describes that a hematoma that had been present was gone.  He did not find evidence of superficial phlebitis or suspect DVT.  CTA of the chest was done to look for PE, patient states she has some chest pain and shortness of breath.  She did have recent surgery.  Also CT the abdomen/pelvis was done to look for anything acute in her abdomen after her surgery.  She keeps stating over and over "I think they left something in there".  Patient was given the results of her CT scan which were basically normal.  She does states she is on Coumadin and is followed at a clinic in [place] to monitor her INR.  She states she had DVT and a PE about 3 years ago.   Review of the [place] database shows patient's had 24 controlled prescriptions in the past 2 years with 11 different providers.  She has multiple prescriptions for tramadol in varying amounts up to 30 tabs, and most recently she had 2 prescriptions in July for #30 oxycodone 7.5/325 from Dr Despina Hidden, and then #30 oxycodone 5 mg tablets on July 14 from the ED, and then #28 hydrocodone 5/325 from Dr. Despina Hidden.  Final Clinical Impressions(s) / ED Diagnoses   Final diagnoses:  Groin pain, left    ED Discharge Orders        Ordered    methocarbamol (ROBAXIN) 500 MG tablet     03/19/18 0981      Plan discharge  Devoria Albe, MD, Concha Pyo, MD 03/19/18 1914    Devoria Albe, MD 03/31/18 2100    Devoria Albe, MD 04/12/18 [phone]

## 2018-03-19 NOTE — ED Notes (Signed)
POC urine preg (Negative)

## 2018-03-19 NOTE — ED Notes (Signed)
Patient asking for pain medication at this time. States that she that she got IV Toradol about 2 am. States that her leg pain is a 6 out of 10 at this time. IV removed at this time, vitals updated.

## 2018-03-19 NOTE — Discharge Instructions (Addendum)
Use ice and heat to the area for comfort. Take the medications you were prescribed. Continue your coumadin. Keep your follow up appointments with Dr Despina HiddenEure.

## 2018-03-21 ENCOUNTER — Telehealth: Payer: Self-pay | Admitting: Obstetrics & Gynecology

## 2018-03-21 MED ORDER — OXYCODONE-ACETAMINOPHEN 7.5-325 MG PO TABS: 1.0000 | ORAL_TABLET | ORAL | 0 refills | Status: DC | PRN

## 2018-03-21 NOTE — Telephone Encounter (Signed)
Informed patient that Dr Despina HiddenEure saw the note and is aware of the entire evaluation done and it was a completely negative work up.  This is the final pain prescription she is getting.   Her past narcotic record is pretty extensive, she may be having pain in her left leg but it is not related to her surgery directly.  This last prescription is non negotiable.  Patient verbalized understanding with no further questions.

## 2018-03-21 NOTE — Telephone Encounter (Signed)
Pt calling wanted to know if she can get some stronger pain meds, [facility], they gave her a muscle relaxer but that is not helping at all , Please advise

## 2018-03-21 NOTE — Telephone Encounter (Signed)
I saw the note and am aware of the entire evaluation done and it was a completely negative work up  This si the final pain prescription she is getting   Her past narcotic record is pretty extensive, she may be having pain in her left leg but it is not related to her surgery directly  This last prescription is non negotiable

## 2018-03-21 NOTE — Telephone Encounter (Signed)
Patient states she is still experiencing a lot of pain to the point of "crying and unable to wak".  Went to the ER 7/20 (see note). Patient states she needs stronger pain medication.  Says nothing is working.  Please advise.

## 2018-03-28 ENCOUNTER — Telehealth: Payer: Self-pay | Admitting: Obstetrics & Gynecology

## 2018-03-28 ENCOUNTER — Encounter: Payer: Self-pay | Admitting: Obstetrics & Gynecology

## 2018-03-28 NOTE — Telephone Encounter (Signed)
Patient called stating that she needs Dr. Despina HiddenEure to write a letter for court stating that she can't because she had surgery and can't walk. Pt states that she would like the letter faxed over @ [phone] its the [employer] in [place]. Pt also states that she would like a call back from Dr. Despina HiddenEure because she would like know if it is normal that she has a problem walking. Please contact pt

## 2018-03-28 NOTE — Telephone Encounter (Signed)
done

## 2018-04-06 ENCOUNTER — Encounter (HOSPITAL_COMMUNITY): Payer: Self-pay | Admitting: *Deleted

## 2018-04-06 ENCOUNTER — Emergency Department (HOSPITAL_COMMUNITY): Payer: Medicaid - Out of State

## 2018-04-06 ENCOUNTER — Emergency Department (HOSPITAL_COMMUNITY)
Admission: EM | Admit: 2018-04-06 | Discharge: 2018-04-07 | Disposition: A | Discharge status: Home / Self Care | Payer: Medicaid - Out of State | Attending: Emergency Medicine | Admitting: Emergency Medicine

## 2018-04-06 DIAGNOSIS — Z7901 Long term (current) use of anticoagulants: Secondary | ICD-10-CM | POA: Diagnosis not present

## 2018-04-06 DIAGNOSIS — M79604 Pain in right leg: Secondary | ICD-10-CM

## 2018-04-06 DIAGNOSIS — E119 Type 2 diabetes mellitus without complications: Secondary | ICD-10-CM | POA: Diagnosis not present

## 2018-04-06 DIAGNOSIS — R0789 Other chest pain: Secondary | ICD-10-CM | POA: Diagnosis not present

## 2018-04-06 DIAGNOSIS — Z79899 Other long term (current) drug therapy: Secondary | ICD-10-CM | POA: Diagnosis not present

## 2018-04-06 DIAGNOSIS — F17210 Nicotine dependence, cigarettes, uncomplicated: Secondary | ICD-10-CM | POA: Diagnosis not present

## 2018-04-06 LAB — PROTIME-INR
INR: 1.2
Prothrombin Time: 15.1 seconds (ref 11.4–15.2)

## 2018-04-06 LAB — BASIC METABOLIC PANEL
Anion gap: 7 (ref 5–15)
BUN: 12 mg/dL (ref 6–20)
CO2: 26 mmol/L (ref 22–32)
Calcium: 9 mg/dL (ref 8.9–10.3)
Chloride: 108 mmol/L (ref 98–111)
Creatinine, Ser: 0.71 mg/dL (ref 0.44–1.00)
GFR calc Af Amer: >60 mL/min (ref >60)
GFR calc non Af Amer: >60 mL/min (ref >60)
Glucose, Bld: 93 mg/dL (ref 70–99)
Potassium: 3.5 mmol/L (ref 3.5–5.1)
Sodium: 141 mmol/L (ref 135–145)

## 2018-04-06 LAB — CBC WITH DIFFERENTIAL/PLATELET
Basophils Absolute: 0 10*3/uL (ref 0.0–0.1)
Basophils Relative: 0 %
Eosinophils Absolute: 0.2 10*3/uL (ref 0.0–0.7)
Eosinophils Relative: 3 %
HCT: 32.9 % — ABNORMAL LOW (ref 36.0–46.0)
Hemoglobin: 10.1 g/dL — ABNORMAL LOW (ref 12.0–15.0)
Lymphocytes Relative: 24 %
Lymphs Abs: 1.6 10*3/uL (ref 0.7–4.0)
MCH: 23.5 pg — ABNORMAL LOW (ref 26.0–34.0)
MCHC: 30.7 g/dL (ref 30.0–36.0)
MCV: 76.5 fL — ABNORMAL LOW (ref 78.0–100.0)
Monocytes Absolute: 0.3 10*3/uL (ref 0.1–1.0)
Monocytes Relative: 4 %
Neutro Abs: 4.5 10*3/uL (ref 1.7–7.7)
Neutrophils Relative %: 69 %
Platelets: 364 10*3/uL (ref 150–400)
RBC: 4.3 MIL/uL (ref 3.87–5.11)
RDW: 18.1 % — ABNORMAL HIGH (ref 11.5–15.5)
WBC: 6.5 10*3/uL (ref 4.0–10.5)

## 2018-04-06 LAB — D-DIMER, QUANTITATIVE (NOT AT ARMC): D-Dimer, Quant: 1.87 ug/mL-FEU — ABNORMAL HIGH (ref 0.00–0.50)

## 2018-04-06 LAB — TROPONIN I: Troponin I: <0.03 ng/mL (ref <0.03)

## 2018-04-06 MED ORDER — ENOXAPARIN SODIUM 120 MG/0.8ML ~~LOC~~ SOLN
1.0000 mg/kg | Freq: Once | SUBCUTANEOUS | Status: AC
  Administered 2018-04-06: 120 mg via SUBCUTANEOUS
  Filled 2018-04-06: qty 0.8

## 2018-04-06 NOTE — ED Provider Notes (Signed)
[facility] EMERGENCY DEPARTMENT Provider Note   CSN: [id number] Arrival date & time: 04/06/18  2036     History   Chief Complaint Chief Complaint  Patient presents with  . Chest Pain    HPI Donna Douglas is a 37 y.o. female.  Patient presents with a 3-day history of right leg pain and concern for DVT.  She was seen in urgent care several days ago but did not come to the hospital as instructed.  She reports intermittent pain in her calf that radiates up her leg.  She does have a history of clot in the past and takes Coumadin.  She has not had her Coumadin for the past week but has it now.  She also has an IVC filter in place.  Patient states is been intermittent central chest pain since Monday as well that comes and goes lasting for a few minutes at a time.  Does not radiate to her arm, neck or back.  Is associated with shortness of breath.  No cough or fever.  Patient underwent oopherectomy 5 weeks ago for ovarian torsion and has had no problems since.  Good p.o. intake and urine output.  No fever  The history is provided by the patient.  Chest Pain   Associated symptoms include shortness of breath. Pertinent negatives include no abdominal pain, no dizziness, no fever, no headaches, no nausea, no vomiting and no weakness.    Past Medical History:  Diagnosis Date  . Asthma   . Diabetes mellitus without complication (HCC)   . DVT (deep venous thrombosis) (HCC)   . Pulmonary emboli (HCC)   . Sleep apnea   . Thyroid disease     Patient Active Problem List   Diagnosis Date Noted  . S/P right oophorectomy 03/01/2018  . Pelvic pain in female   . Torsion of right ovary and ovarian pedicle   . Hemoperitoneum, nontraumatic   . [redacted] weeks gestation of pregnancy   . Pleurisy   . Chest pain 04/16/2017  . Intrauterine pregnancy 04/16/2017  . 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 Deep vein thrombosis (DVT) of left lower extremity (HCC)  03/23/2016  . DM (diabetes mellitus) (HCC) 03/23/2016  . Thyroid condition 03/23/2016  . Pulmonary emboli (HCC) 01/05/2016  . Bilateral pulmonary embolism (HCC) 01/05/2016    Past Surgical History:  Procedure Laterality Date  . anxiety    . IVC FILTER PLACEMENT ([facility] HX)    . SALPINGOOPHORECTOMY Right 03/01/2018   Procedure: LAPARATOMY WITH SALPINGO OOPHORECTOMY RIGHT SIDE;  Surgeon: Lazaro Arms, MD;  Location: [facility];  Service: Gynecology;  Laterality: Right;     OB History    Gravida  8   Para  8   Term      Preterm      AB      Living  8     SAB      TAB      Ectopic      Multiple      Live Births               Home Medications    Prior to Admission medications   Medication Sig Start Date End Date Taking? Authorizing Provider  albuterol (VENTOLIN HFA) 108 (90 Base) MCG/ACT inhaler Inhale 2 puffs into the lungs every 6 (six) hours as needed for wheezing or shortness of breath. 04/17/17   Catarina Hartshorn, MD  azelastine (ASTELIN) 0.1 % nasal spray  Place 2 sprays into both nostrils 2 (two) times daily. 01/19/18   [provider]  cetirizine (ZYRTEC) 10 MG tablet Take 1 tablet by mouth daily as needed. 12/10/17   [provider]  HYDROcodone-acetaminophen (NORCO/VICODIN) 5-325 MG tablet Take 1 tablet by mouth every 6 (six) hours as needed. 03/15/18   Lazaro Arms, MD  ibuprofen (ADVIL,MOTRIN) 400 MG tablet Take 1 tablet (400 mg total) by mouth every 6 (six) hours as needed. 03/13/18   Mesner, Barbara Cower, MD  ketorolac (TORADOL) 10 MG tablet Take 1 tablet (10 mg total) by mouth every 8 (eight) hours as needed. Patient not taking: Reported on 03/15/2018 03/02/18   Lazaro Arms, MD  methocarbamol (ROBAXIN) 500 MG tablet Take 1 or 2 po Q 6hrs for pain 03/19/18   Devoria Albe, MD  oxyCODONE (ROXICODONE) 5 MG immediate release tablet Take 1-2 tablets (5-10 mg total) by mouth every 6 (six) hours as needed for severe pain or breakthrough pain. Patient not taking:  Reported on 03/15/2018 03/13/18   Mesner, Barbara Cower, MD  oxyCODONE-acetaminophen (PERCOCET) 7.5-325 MG tablet Take 1-2 tablets by mouth every 4 (four) hours as needed for moderate pain. 03/21/18   Lazaro Arms, MD  promethazine (PHENERGAN) 25 MG tablet Take 1 tablet (25 mg total) by mouth every 6 (six) hours as needed for nausea. Patient not taking: Reported on 03/15/2018 03/02/18   Lazaro Arms, MD  ranitidine (ZANTAC) 150 MG tablet Take 1 tablet by mouth as needed.  07/08/17   [provider]  warfarin (COUMADIN) 10 MG tablet Take 10 mg by mouth daily. as directed 01/19/18   [provider]    Family History Family History  Problem Relation Age of Onset  . Asthma Father   . Diabetes Mother   . Hypertension Mother     Social History Social History   Tobacco Use  . Smoking status: Current Every Day Smoker    Packs/day: 1.50    Types: Cigarettes  . Smokeless tobacco: Never Used  Substance Use Topics  . Alcohol use: No  . Drug use: Not Currently    Types: Marijuana    Comment: denies use 04/06/18     Allergies   Patient has no known allergies.   Review of Systems Review of Systems  Constitutional: Negative for activity change, appetite change and fever.  HENT: Negative for congestion.   Eyes: Negative for visual disturbance.  Respiratory: Positive for chest tightness and shortness of breath.   Cardiovascular: Positive for chest pain.  Gastrointestinal: Negative for abdominal pain, nausea and vomiting.  Genitourinary: Negative for dysuria and hematuria.  Musculoskeletal: Positive for arthralgias and myalgias.  Skin: Negative for rash.  Neurological: Negative for dizziness, weakness and headaches.   all other systems are negative except as noted in the HPI and PMH.    Physical Exam Updated Vital Signs BP (!) 143/96 (BP Location: Right Arm)   Pulse (!) 115   Temp 99.3 F (37.4 C) (Oral)   Resp 20   Ht 5\' 4"  (1.626 m)   Wt 118.8 kg (262 lb)   LMP  04/03/2018   SpO2 98%   BMI 44.97 kg/m   Physical Exam  Constitutional: She is oriented to person, place, and time. She appears well-developed and well-nourished. No distress.  HENT:  Head: Normocephalic and atraumatic.  Mouth/Throat: Oropharynx is clear and moist. No oropharyngeal exudate.  Eyes: Pupils are equal, round, and reactive to light. Conjunctivae and EOM are normal.  Neck: Normal range  of motion. Neck supple.  No meningismus.  Cardiovascular: Normal rate, regular rhythm, normal heart sounds and intact distal pulses.  No murmur heard. Pulmonary/Chest: Effort normal and breath sounds normal. No respiratory distress. She exhibits no tenderness.  Abdominal: Soft. There is no tenderness. There is no rebound and no guarding.  Musculoskeletal: Normal range of motion. She exhibits edema and tenderness.  R leg calf tenderness. No palpable cords. Intact DP and PT pulses.  No appreciable assymetry  Neurological: She is alert and oriented to person, place, and time. No cranial nerve deficit. She exhibits normal muscle tone. Coordination normal.   5/5 strength throughout. CN 2-12 intact.Equal grip strength.   Skin: Skin is warm. Capillary refill takes less than 2 seconds.  Psychiatric: She has a normal mood and affect. Her behavior is normal.  Nursing note and vitals reviewed.    ED Treatments / Results  Labs (all labs ordered are listed, but only abnormal results are displayed) Labs Reviewed  CBC WITH DIFFERENTIAL/PLATELET - Abnormal; Notable for the following components:      Result Value   Hemoglobin 10.1 (*)    HCT 32.9 (*)    MCV 76.5 (*)    MCH 23.5 (*)    RDW 18.1 (*)    All other components within normal limits  D-DIMER, QUANTITATIVE (NOT AT [facility]) - Abnormal; Notable for the following components:   D-Dimer, Quant 1.87 (*)    All other components within normal limits  TROPONIN I  BASIC METABOLIC PANEL  PROTIME-INR  TROPONIN I    EKG None  Radiology Dg  Chest 2 View  Result Date: 04/06/2018 CLINICAL DATA:  Acute onset of right knee pain, radiating to the chest. Shortness of breath. EXAM: CHEST - 2 VIEW COMPARISON:  Chest radiograph performed 04/16/2017, and CTA of the chest performed 03/19/2018 FINDINGS: The lungs are well-aerated. Minimal bibasilar atelectasis is noted. There is no evidence of pleural effusion or pneumothorax. The heart is normal in size; the mediastinal contour is within normal limits. No acute osseous abnormalities are seen. IMPRESSION: Minimal bibasilar atelectasis noted; lungs otherwise clear. Electronically Signed   By: Roanna RaiderJeffery  Chang M.D.   On: 04/06/2018 22:02   Ct Angio Chest Pe W And/or Wo Contrast  Result Date: 04/07/2018 CLINICAL DATA:  Positive D-dimer with concern for DVT in the right lower extremity. Chest pain on Monday with dyspnea. EXAM: CT ANGIOGRAPHY CHEST WITH CONTRAST TECHNIQUE: Multidetector CT imaging of the chest was performed using the standard protocol during bolus administration of intravenous contrast. Multiplanar CT image reconstructions and MIPs were obtained to evaluate the vascular anatomy. CONTRAST:  100mL ISOVUE-370 IOPAMIDOL (ISOVUE-370) INJECTION 76% COMPARISON:  None. FINDINGS: Cardiovascular: The study is of quality for the evaluation of pulmonary embolism. There are no filling defects in the central, lobar, segmental or subsegmental pulmonary artery branches to suggest acute pulmonary embolism. Conventional branch pattern of the great vessels. Great vessels are normal in course and caliber. Top-normal heart size. No significant pericardial fluid/thickening. Nonaneurysmal thoracic aorta without dissection. Mediastinum/Nodes: No discrete thyroid nodules. Unremarkable esophagus. No pathologically enlarged axillary, mediastinal or hilar lymph nodes. Lungs/Pleura: No pneumothorax. No pleural effusion. Subsegmental atelectasis in the right lower lobe posteriorly. Upper abdomen: Unremarkable. Musculoskeletal:   No aggressive appearing focal osseous lesions. Review of the MIP images confirms the above findings. IMPRESSION: No acute pulmonary embolus.  No active pulmonary disease. Electronically Signed   By: Tollie Ethavid  Kwon M.D.   On: 04/07/2018 00:57    Procedures Procedures (including critical care time)  Medications Ordered in ED Medications  enoxaparin (LOVENOX) injection 120 mg (has no administration in time range)     Initial Impression / Assessment and Plan / ED Course  I have reviewed the triage vital signs and the nursing notes.  Pertinent labs & imaging results that were available during my care of the patient were reviewed by me and considered in my medical decision making (see chart for details).     Patient with right leg pain for 3 days with intermittent chest pain or shortness of breath.  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  INR subtherapeutic today. Distal pulses intact. Pulses intact.   EKG is tachycardic without acute ischemic change. Troponin negative.  Doppler is unavailable today.  Patient given Lovenox dose.  Troponin negative x2.  Low suspicion for ACS.  CT negative for pulmonary embolism.  Patient counseled on her low INR.  She is given a dose of Lovenox and a prescription for same.  We will schedule Doppler ultrasound of her leg tomorrow.  Follow-up with PCP.  Return precautions discussed.   ED ECG REPORT   Date: 04/07/2018  Rate: 102  Rhythm: sinus tachycardia  QRS Axis: normal  Intervals: normal  ST/T Wave abnormalities: nonspecific ST changes  Conduction Disutrbances:none  Narrative Interpretation: artifact  Old EKG Reviewed: unchanged  I have personally reviewed the EKG tracing and agree with the computerized printout as noted.   Final Clinical Impressions(s) / ED Diagnoses   Final diagnoses:  Atypical chest pain  Right leg pain    ED Discharge Orders    None       Lakendrick Paradis, Jeannett Senior, MD 04/07/18 [phone]

## 2018-04-06 NOTE — ED Notes (Signed)
Pt on Coumadin

## 2018-04-06 NOTE — ED Triage Notes (Addendum)
Pt concerned about DVT to right LE, seen at Urgent Care Monday and instructed to go to ED but pt didn't.  Pt began to have CP on Monday as well with SOB.  Denies N/V.  Pt with recent surgery on 03/01/18- removal of an ovary.

## 2018-04-07 LAB — TROPONIN I: Troponin I: <0.03 ng/mL (ref <0.03)

## 2018-04-07 MED ORDER — ENOXAPARIN SODIUM 120 MG/0.8ML ~~LOC~~ SOLN: 120.0000 mg | Freq: Two times a day (BID) | SUBCUTANEOUS | 0 refills | Status: DC

## 2018-04-07 MED ORDER — IOPAMIDOL (ISOVUE-370) INJECTION 76%
100.0000 mL | Freq: Once | INTRAVENOUS | Status: AC | PRN
  Administered 2018-04-07: 100 mL via INTRAVENOUS

## 2018-04-07 MED ORDER — KETOROLAC TROMETHAMINE 30 MG/ML IJ SOLN
INTRAMUSCULAR | Status: AC
  Administered 2018-04-07: 30 mg via INTRAVENOUS
  Filled 2018-04-07: qty 1

## 2018-04-07 MED ORDER — ACETAMINOPHEN 500 MG PO TABS: 500.0000 mg | ORAL_TABLET | Freq: Four times a day (QID) | ORAL | 0 refills | Status: DC | PRN

## 2018-04-07 NOTE — Discharge Instructions (Addendum)
Take the lovenox as prescribed and followup with your doctor for a recheck of your INR. Followup for an ultrasound of your leg to assess for clot. Return to the ED if you develop chest pain, shortness of breath, or any other concerns.

## 2018-04-11 ENCOUNTER — Encounter: Payer: Self-pay | Admitting: Obstetrics & Gynecology

## 2018-04-18 ENCOUNTER — Other Ambulatory Visit: Payer: Self-pay

## 2018-04-18 ENCOUNTER — Ambulatory Visit (INDEPENDENT_AMBULATORY_CARE_PROVIDER_SITE_OTHER): Payer: Self-pay | Admitting: Obstetrics & Gynecology

## 2018-04-18 ENCOUNTER — Encounter: Payer: Self-pay | Admitting: Obstetrics & Gynecology

## 2018-04-18 VITALS — BP 115/76 | HR 94 | Ht 65.0 in | Wt 268.0 lb

## 2018-04-18 DIAGNOSIS — Z9889 Other specified postprocedural states: Secondary | ICD-10-CM

## 2018-04-18 NOTE — Progress Notes (Signed)
  HPI: Patient returns for routine postoperative follow-up having undergone exploratory laparotomy with RSO for rupture hemorrhagic CL and no flow to ovary on 6 weeks ago.  The patient's immediate postoperative recovery has been unremarkable. Since hospital discharge the patient reports had another blood clot in right calf, ran out of her coumadin.   Current Outpatient Medications: acetaminophen (TYLENOL) 500 MG tablet, Take 1 tablet (500 mg total) by mouth every 6 (six) hours as needed., Disp: 30 tablet, Rfl: 0 albuterol (VENTOLIN HFA) 108 (90 Base) MCG/ACT inhaler, Inhale 2 puffs into the lungs every 6 (six) hours as needed for wheezing or shortness of breath., Disp: 1 Inhaler, Rfl: 0 promethazine (PHENERGAN) 25 MG tablet, Take 1 tablet (25 mg total) by mouth every 6 (six) hours as needed for nausea., Disp: 30 tablet, Rfl: 1 warfarin (COUMADIN) 10 MG tablet, Take 10 mg by mouth every evening. as directed, Disp: , Rfl: 3 ibuprofen (ADVIL,MOTRIN) 400 MG tablet, Take 1 tablet (400 mg total) by mouth every 6 (six) hours as needed. (Patient not taking: Reported on 04/06/2018), Disp: 30 tablet, Rfl: 0 methocarbamol (ROBAXIN) 500 MG tablet, Take 1 or 2 po Q 6hrs for pain (Patient not taking: Reported on 04/18/2018), Disp: 60 tablet, Rfl: 0  No current facility-administered medications for this visit.     Blood pressure 115/76, pulse 94, height 5\' 5"  (1.651 m), weight 268 lb (121.6 kg), last menstrual period 04/03/2018.  Physical Exam: Incision clean dry intact Resolved left hip pain  Diagnostic Tests:   Pathology: benign  Impression: S/p ex lap with rso  Plan:   Follow up: prn    Lazaro ArmsLuther H Deneane Stifter, MD

## 2019-02-15 IMAGING — CT CT ANGIO CHEST
2 of 6 series · 18 of 46 positions shown · IV contrast (Isovue)
Comparison: None.

CLINICAL DATA: Positive D-dimer with concern for DVT in the right
lower extremity. Chest pain on [REDACTED] with dyspnea.

EXAM:
CT ANGIOGRAPHY CHEST WITH CONTRAST
TECHNIQUE: Multidetector CT imaging of the chest was performed using the
standard protocol during bolus administration of intravenous
contrast. Multiplanar CT image reconstructions and MIPs were
obtained to evaluate the vascular anatomy.
CONTRAST:  100mL T232VN-CQP IOPAMIDOL (T232VN-CQP) INJECTION 76%

[Series 6: thins · axial · 0.98mm/px · z∈[+1284,+1508]mm · 15 of 247 slices shown]
[im 11/247  lung]
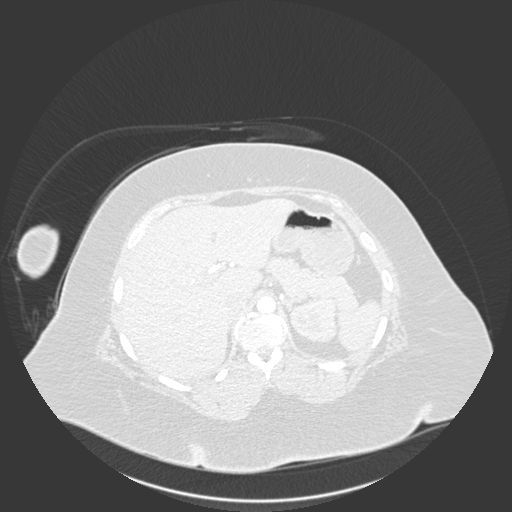
[im 33/247  soft-tissue]
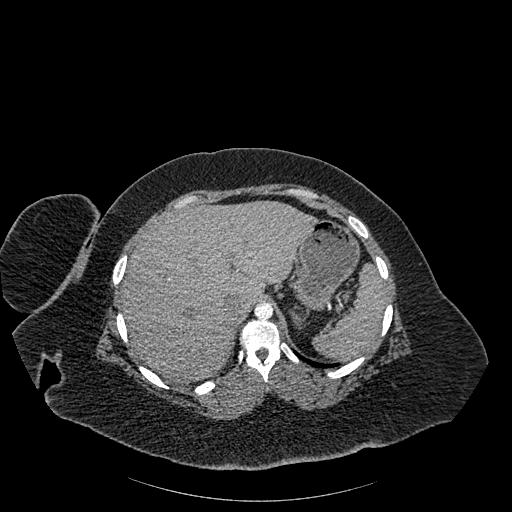
[im 43/247  lung]
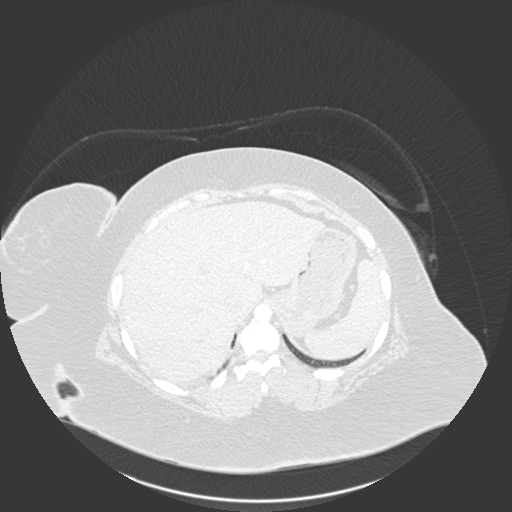
[im 65/247  soft-tissue]
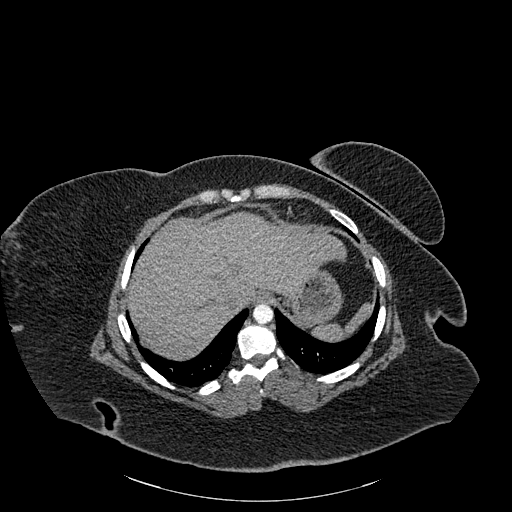
[im 75/247  lung]
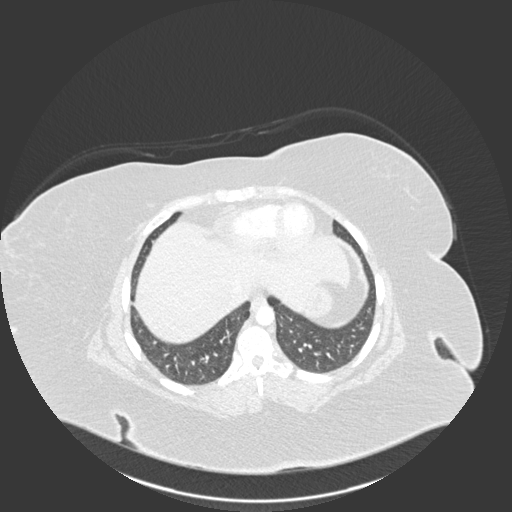
[im 97/247  soft-tissue]
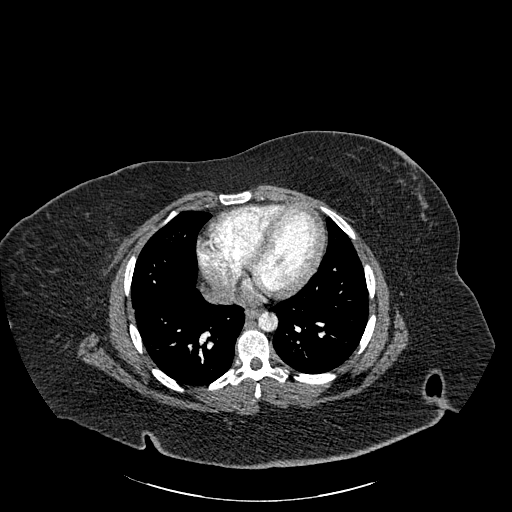
[im 107/247  lung]
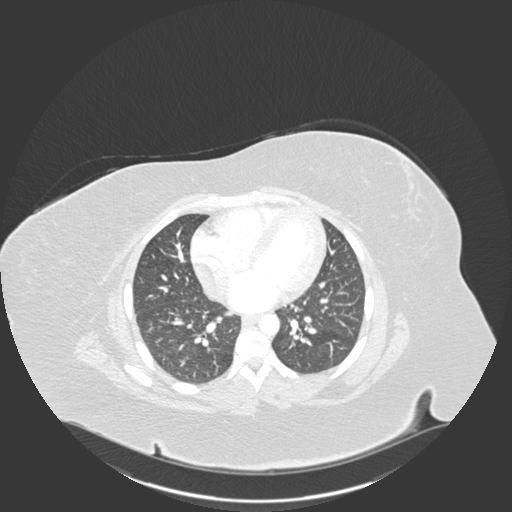
[im 129/247  soft-tissue]
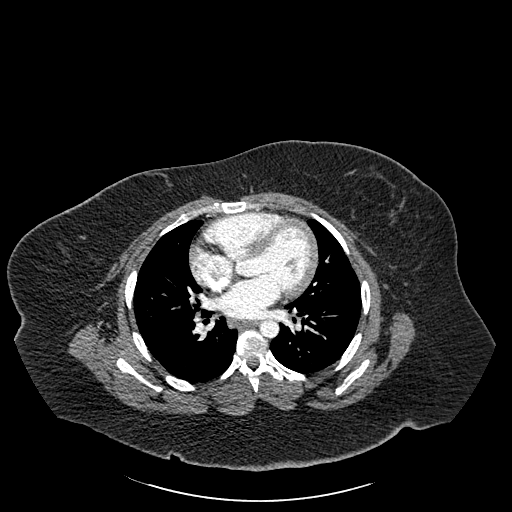
[im 140/247  lung]
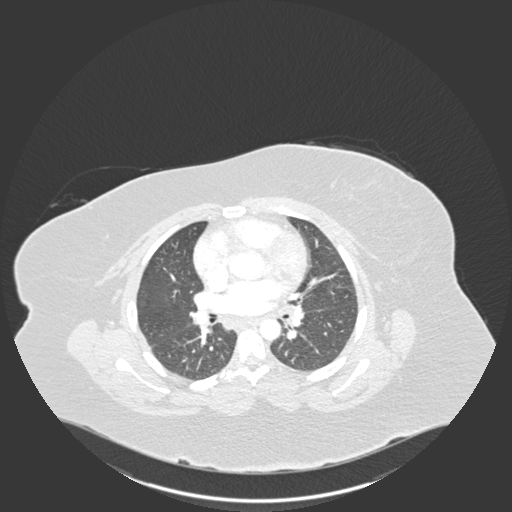
[im 150/247  soft-tissue]
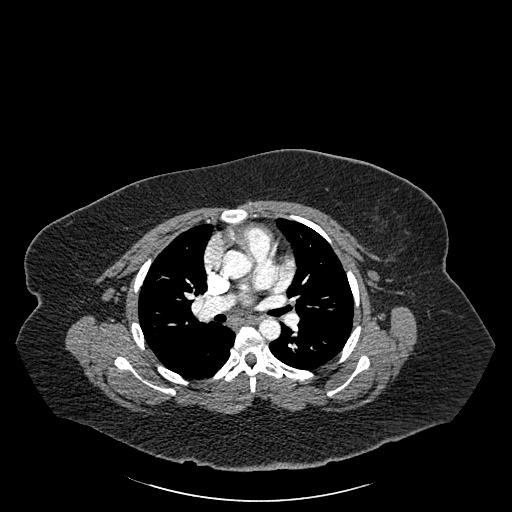
[im 172/247  lung]
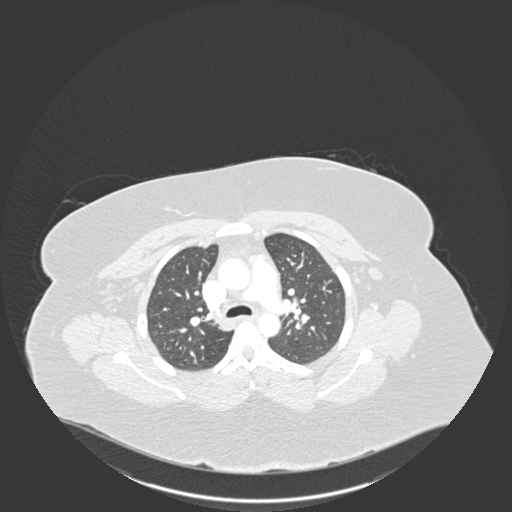
[im 182/247  soft-tissue]
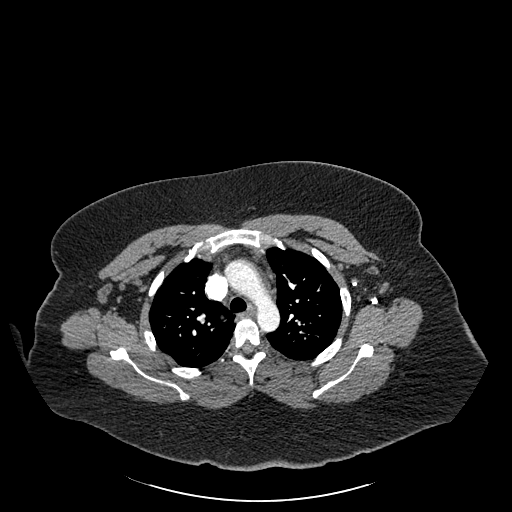
[im 204/247  lung]
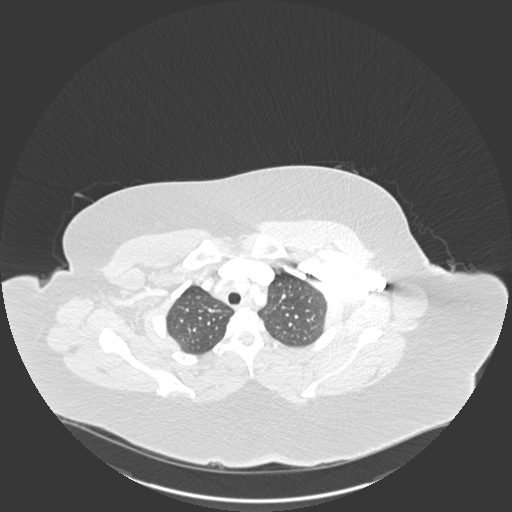
[im 214/247  soft-tissue]
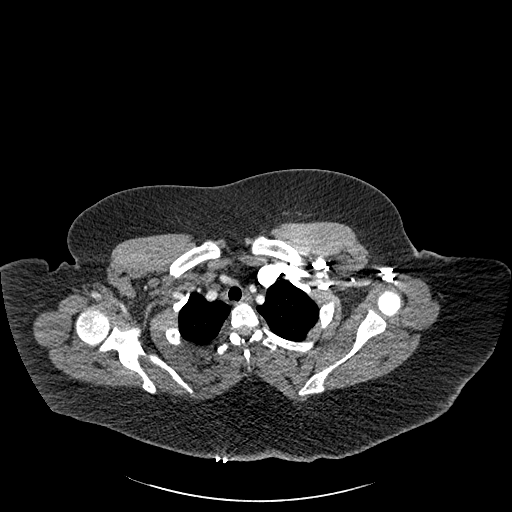
[im 236/247  lung]
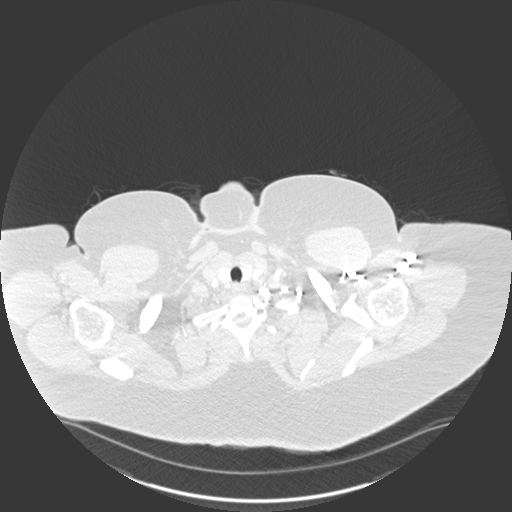

[Series 9: coronal mpr · coronal · 0.47mm/px · 3 of 130 slices shown]
[im 33/130  soft-tissue]
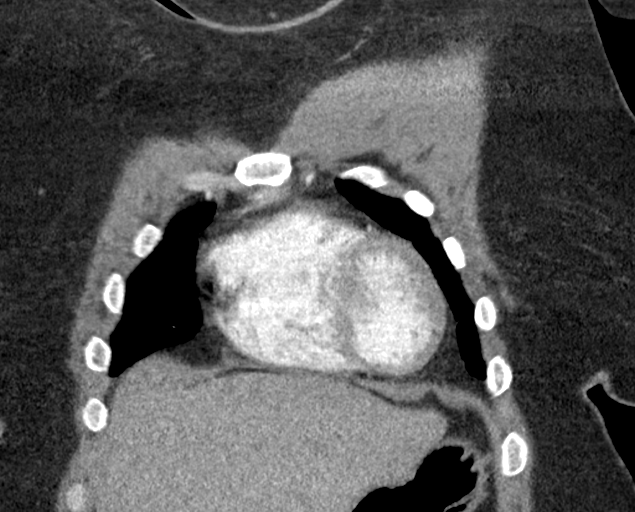
[im 65/130  soft-tissue]
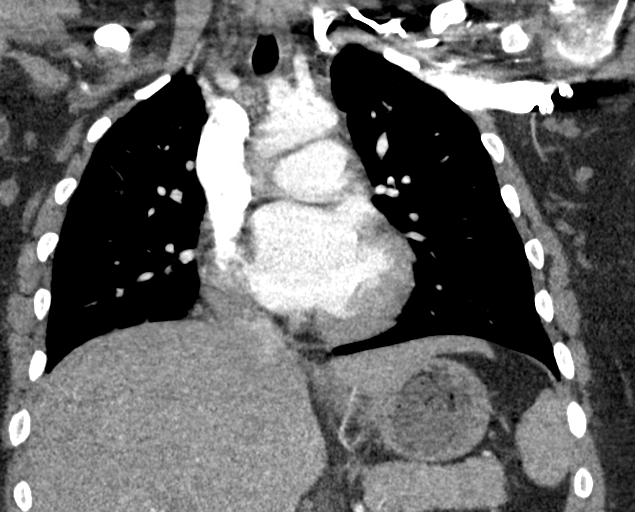
[im 97/130  soft-tissue]
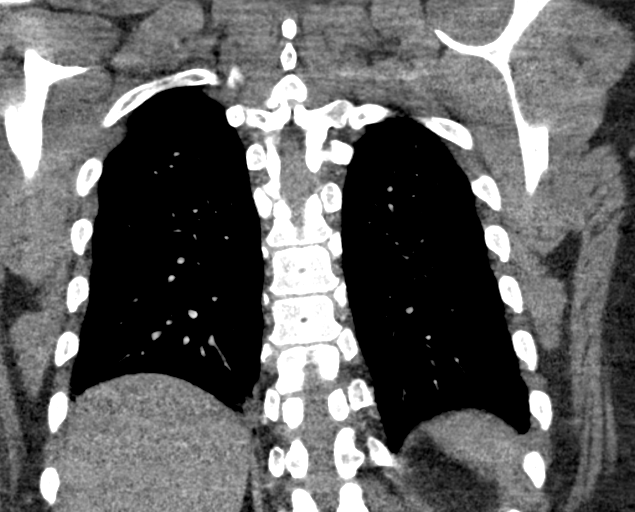

[18 of 46 positions shown; findings below may reference images not displayed]

FINDINGS: Cardiovascular: The study is of quality for the evaluation of
pulmonary embolism. There are no filling defects in the central,
lobar, segmental or subsegmental pulmonary artery branches to
suggest acute pulmonary embolism. Conventional branch pattern of the
great vessels. Great vessels are normal in course and caliber.
Top-normal heart size. No significant pericardial fluid/thickening.
Nonaneurysmal thoracic aorta without dissection.

Mediastinum/Nodes: No discrete thyroid nodules. Unremarkable
esophagus. No pathologically enlarged axillary, mediastinal or hilar
lymph nodes.

Lungs/Pleura: No pneumothorax. No pleural effusion. Subsegmental
atelectasis in the right lower lobe posteriorly.

Upper abdomen: Unremarkable.

Musculoskeletal:  No aggressive appearing focal osseous lesions.

Review of the MIP images confirms the above findings.
IMPRESSION: No acute pulmonary embolus.  No active pulmonary disease.

## 2019-09-20 ENCOUNTER — Encounter (HOSPITAL_COMMUNITY): Payer: Self-pay

## 2019-09-20 ENCOUNTER — Emergency Department (HOSPITAL_COMMUNITY)
Admission: EM | Admit: 2019-09-20 | Discharge: 2019-09-20 | Disposition: A | Discharge status: Home / Self Care | Payer: Medicaid - Out of State | Attending: Emergency Medicine | Admitting: Emergency Medicine

## 2019-09-20 DIAGNOSIS — E119 Type 2 diabetes mellitus without complications: Secondary | ICD-10-CM | POA: Diagnosis not present

## 2019-09-20 DIAGNOSIS — J45909 Unspecified asthma, uncomplicated: Secondary | ICD-10-CM | POA: Insufficient documentation

## 2019-09-20 DIAGNOSIS — R519 Headache, unspecified: Secondary | ICD-10-CM | POA: Insufficient documentation

## 2019-09-20 DIAGNOSIS — F17210 Nicotine dependence, cigarettes, uncomplicated: Secondary | ICD-10-CM | POA: Diagnosis not present

## 2019-09-20 DIAGNOSIS — Z7901 Long term (current) use of anticoagulants: Secondary | ICD-10-CM | POA: Insufficient documentation

## 2019-09-20 MED ORDER — HYDROMORPHONE HCL 2 MG/ML IJ SOLN
2.0000 mg | Freq: Once | INTRAMUSCULAR | Status: AC
  Administered 2019-09-20: 12:00:00 2 mg via INTRAMUSCULAR
  Filled 2019-09-20: qty 1

## 2019-09-20 MED ORDER — TRAMADOL HCL 50 MG PO TABS: 50.0000 mg | ORAL_TABLET | Freq: Four times a day (QID) | ORAL | 0 refills | Status: DC | PRN

## 2019-09-20 MED ORDER — KETOROLAC TROMETHAMINE 30 MG/ML IJ SOLN
60.0000 mg | Freq: Once | INTRAMUSCULAR | Status: AC
  Administered 2019-09-20: 60 mg via INTRAMUSCULAR
  Filled 2019-09-20: qty 2

## 2019-09-20 MED ORDER — ONDANSETRON 4 MG PO TBDP
4.0000 mg | ORAL_TABLET | Freq: Once | ORAL | Status: AC
  Administered 2019-09-20: 12:00:00 4 mg via ORAL
  Filled 2019-09-20: qty 1

## 2019-09-20 NOTE — ED Provider Notes (Signed)
[facility] EMERGENCY DEPARTMENT Provider Note   CSN: [id number] Arrival date & time: 09/20/19  1001     History Chief Complaint  Patient presents with  . Migraine    Donna Douglas is a 39 y.o. female.  Patient complains of a headache.  Patient has history of bad headache  The history is provided by the patient. No language interpreter was used.  Migraine This is a new problem. The current episode started 12 to 24 hours ago. The problem occurs constantly. The problem has not changed since onset.Associated symptoms include headaches. Pertinent negatives include no chest pain and no abdominal pain. Nothing aggravates the symptoms. Nothing relieves the symptoms. She has tried nothing for the symptoms.       Past Medical History:  Diagnosis Date  . Asthma   . Diabetes mellitus without complication (HCC)   . DVT (deep venous thrombosis) (HCC)   . Pulmonary emboli (HCC)   . Sleep apnea   . Thyroid disease     Patient Active Problem List   Diagnosis Date Noted  . S/P right oophorectomy 03/01/2018  . Pelvic pain in female   . Torsion of right ovary and ovarian pedicle   . Hemoperitoneum, nontraumatic   . [redacted] weeks gestation of pregnancy   . Pleurisy   . Chest pain 04/16/2017  . Intrauterine pregnancy 04/16/2017  . 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 DM (diabetes mellitus) (HCC) 03/23/2016  . Thyroid condition 03/23/2016  . Pulmonary emboli (HCC) 01/05/2016  . Bilateral pulmonary embolism (HCC) 01/05/2016    Past Surgical History:  Procedure Laterality Date  . anxiety    . IVC FILTER PLACEMENT ([facility] HX)    . SALPINGOOPHORECTOMY Right 03/01/2018   Procedure: LAPARATOMY WITH SALPINGO OOPHORECTOMY RIGHT SIDE;  Surgeon: Lazaro Arms, MD;  Location: [facility];  Service: Gynecology;  Laterality: Right;     OB History    Gravida  8   Para  8   Term     Preterm      AB      Living  8     SAB      TAB      Ectopic      Multiple      Live Births              Family History  Problem Relation Age of Onset  . Asthma Father   . Diabetes Mother   . Hypertension Mother     Social History   Tobacco Use  . Smoking status: Current Every Day Smoker    Packs/day: 1.50    Types: Cigarettes  . Smokeless tobacco: Never Used  Substance Use Topics  . Alcohol use: No  . Drug use: Not Currently    Types: Marijuana    Comment: denies use 04/06/18    Home Medications Prior to Admission medications   Medication Sig Start Date End Date Taking? Authorizing Provider  acetaminophen (TYLENOL) 500 MG tablet Take 1 tablet (500 mg total) by mouth every 6 (six) hours as needed. 04/07/18   Rancour, Jeannett Senior, MD  albuterol (VENTOLIN HFA) 108 (90 Base) MCG/ACT inhaler Inhale 2 puffs into the lungs every 6 (six) hours as needed for wheezing or shortness of breath. 04/17/17   Catarina Hartshorn, MD  ibuprofen (ADVIL,MOTRIN) 400 MG tablet Take 1 tablet (400 mg total) by mouth every 6 (six) hours as  needed. Patient not taking: Reported on 04/06/2018 03/13/18   Mesner, Corene Cornea, MD  methocarbamol (ROBAXIN) 500 MG tablet Take 1 or 2 po Q 6hrs for pain Patient not taking: Reported on 04/18/2018 03/19/18   Rolland Porter, MD  promethazine (PHENERGAN) 25 MG tablet Take 1 tablet (25 mg total) by mouth every 6 (six) hours as needed for nausea. 03/02/18   Florian Buff, MD  traMADol (ULTRAM) 50 MG tablet Take 1 tablet (50 mg total) by mouth every 6 (six) hours as needed. 09/20/19   Milton Ferguson, MD  warfarin (COUMADIN) 10 MG tablet Take 10 mg by mouth every evening. as directed 01/19/18   [provider]    Allergies    Patient has no known allergies.  Review of Systems   Review of Systems  Constitutional: Negative for appetite change and fatigue.  HENT: Negative for congestion, ear discharge and sinus pressure.   Eyes: Negative for discharge.  Respiratory:  Negative for cough.   Cardiovascular: Negative for chest pain.  Gastrointestinal: Negative for abdominal pain and diarrhea.  Genitourinary: Negative for frequency and hematuria.  Musculoskeletal: Negative for back pain.  Skin: Negative for rash.  Neurological: Positive for headaches. Negative for seizures.  Psychiatric/Behavioral: Negative for hallucinations.    Physical Exam Updated Vital Signs BP (!) 97/48 (BP Location: Right Wrist)   Pulse 65   Temp 98.6 F (37 C) (Oral)   Resp 16   Ht 5\' 5"  (1.651 m)   Wt 120.2 kg   LMP 08/15/2019   SpO2 97%   BMI 44.10 kg/m   Physical Exam Vitals and nursing note reviewed.  Constitutional:      Appearance: She is well-developed.  HENT:     Head: Normocephalic.     Nose: Nose normal.  Eyes:     General: No scleral icterus.    Conjunctiva/sclera: Conjunctivae normal.  Neck:     Thyroid: No thyromegaly.  Cardiovascular:     Rate and Rhythm: Normal rate and regular rhythm.     Heart sounds: No murmur. No friction rub. No gallop.   Pulmonary:     Breath sounds: No stridor. No wheezing or rales.  Chest:     Chest wall: No tenderness.  Abdominal:     General: There is no distension.     Tenderness: There is no abdominal tenderness. There is no rebound.  Musculoskeletal:        General: Normal range of motion.     Cervical back: Neck supple.  Lymphadenopathy:     Cervical: No cervical adenopathy.  Skin:    Findings: No erythema or rash.  Neurological:     Mental Status: She is alert and oriented to person, place, and time.     Motor: No abnormal muscle tone.     Coordination: Coordination normal.  Psychiatric:        Behavior: Behavior normal.     ED Results / Procedures / Treatments   Labs (all labs ordered are listed, but only abnormal results are displayed) Labs Reviewed - No data to display  EKG None  Radiology No results found.  Procedures Procedures (including critical care time)  Medications Ordered in  ED Medications  ketorolac (TORADOL) 30 MG/ML injection 60 mg (60 mg Intramuscular Given 09/20/19 1056)  HYDROmorphone (DILAUDID) injection 2 mg (2 mg Intramuscular Given 09/20/19 1158)  ondansetron (ZOFRAN-ODT) disintegrating tablet 4 mg (4 mg Oral Given 09/20/19 1157)    ED Course  I have reviewed the triage vital signs and  the nursing notes.  Pertinent labs & imaging results that were available during my care of the patient were reviewed by me and considered in my medical decision making (see chart for details).    MDM Rules/Calculators/A&P                      Patient with migraine headache.  Patient improved with treatment emergency department and is referred to neurology for further care Final Clinical Impression(s) / ED Diagnoses Final diagnoses:  Bad headache    Rx / DC Orders ED Discharge Orders         Ordered    traMADol (ULTRAM) 50 MG tablet  Every 6 hours PRN     09/20/19 1254           Bethann Berkshire, MD 09/20/19 1301

## 2019-09-20 NOTE — ED Triage Notes (Signed)
Pt has been having a migraine for the last 3-4 days. History of migraines, but doesn't have any prescribed medications. Ibuprofen and Tylenol have not helped. Migraines have made her dizzy as well.

## 2019-09-20 NOTE — Discharge Instructions (Addendum)
Follow-up with Dr. Gerilyn Pilgrim the neurologist for treatment of your migraine.

## 2019-11-14 ENCOUNTER — Other Ambulatory Visit: Payer: Self-pay

## 2019-11-14 ENCOUNTER — Encounter (HOSPITAL_COMMUNITY): Payer: Self-pay | Admitting: Emergency Medicine

## 2019-11-14 ENCOUNTER — Emergency Department (HOSPITAL_COMMUNITY): Payer: Medicaid - Out of State

## 2019-11-14 ENCOUNTER — Emergency Department (HOSPITAL_COMMUNITY)
Admission: EM | Admit: 2019-11-14 | Discharge: 2019-11-15 | Disposition: A | Discharge status: Home / Self Care | Payer: Medicaid - Out of State | Attending: Emergency Medicine | Admitting: Emergency Medicine

## 2019-11-14 DIAGNOSIS — Z7901 Long term (current) use of anticoagulants: Secondary | ICD-10-CM | POA: Insufficient documentation

## 2019-11-14 DIAGNOSIS — F17210 Nicotine dependence, cigarettes, uncomplicated: Secondary | ICD-10-CM | POA: Diagnosis not present

## 2019-11-14 DIAGNOSIS — R079 Chest pain, unspecified: Secondary | ICD-10-CM | POA: Diagnosis present

## 2019-11-14 DIAGNOSIS — R0602 Shortness of breath: Secondary | ICD-10-CM | POA: Insufficient documentation

## 2019-11-14 DIAGNOSIS — J45909 Unspecified asthma, uncomplicated: Secondary | ICD-10-CM | POA: Diagnosis not present

## 2019-11-14 DIAGNOSIS — E119 Type 2 diabetes mellitus without complications: Secondary | ICD-10-CM | POA: Insufficient documentation

## 2019-11-14 DIAGNOSIS — Z79899 Other long term (current) drug therapy: Secondary | ICD-10-CM | POA: Insufficient documentation

## 2019-11-14 LAB — CBC
HCT: 31.1 % — ABNORMAL LOW (ref 36.0–46.0)
Hemoglobin: 8.6 g/dL — ABNORMAL LOW (ref 12.0–15.0)
MCH: 19.2 pg — ABNORMAL LOW (ref 26.0–34.0)
MCHC: 27.7 g/dL — ABNORMAL LOW (ref 30.0–36.0)
MCV: 69.6 fL — ABNORMAL LOW (ref 80.0–100.0)
Platelets: 470 10*3/uL — ABNORMAL HIGH (ref 150–400)
RBC: 4.47 MIL/uL (ref 3.87–5.11)
RDW: 19.4 % — ABNORMAL HIGH (ref 11.5–15.5)
WBC: 5.8 10*3/uL (ref 4.0–10.5)
nRBC: 0 % (ref 0.0–0.2)

## 2019-11-14 LAB — PROTIME-INR
INR: 1.2 (ref 0.8–1.2)
Prothrombin Time: 15.3 seconds — ABNORMAL HIGH (ref 11.4–15.2)

## 2019-11-14 LAB — BASIC METABOLIC PANEL
Anion gap: 5 (ref 5–15)
BUN: 10 mg/dL (ref 6–20)
CO2: 27 mmol/L (ref 22–32)
Calcium: 8.8 mg/dL — ABNORMAL LOW (ref 8.9–10.3)
Chloride: 106 mmol/L (ref 98–111)
Creatinine, Ser: 0.78 mg/dL (ref 0.44–1.00)
GFR calc Af Amer: >60 mL/min (ref >60)
GFR calc non Af Amer: >60 mL/min (ref >60)
Glucose, Bld: 122 mg/dL — ABNORMAL HIGH (ref 70–99)
Potassium: 3.9 mmol/L (ref 3.5–5.1)
Sodium: 138 mmol/L (ref 135–145)

## 2019-11-14 LAB — TROPONIN I (HIGH SENSITIVITY): Troponin I (High Sensitivity): <2 ng/L (ref <18)

## 2019-11-14 LAB — D-DIMER, QUANTITATIVE (NOT AT ARMC): D-Dimer, Quant: 0.47 ug/mL-FEU (ref 0.00–0.50)

## 2019-11-14 MED ORDER — KETOROLAC TROMETHAMINE 15 MG/ML IJ SOLN
15.0000 mg | Freq: Once | INTRAMUSCULAR | Status: DC
  Filled 2019-11-14: qty 1

## 2019-11-14 MED ORDER — ONDANSETRON HCL 4 MG/2ML IJ SOLN
4.0000 mg | Freq: Once | INTRAMUSCULAR | Status: AC
  Administered 2019-11-14: 4 mg via INTRAVENOUS
  Filled 2019-11-14: qty 2

## 2019-11-14 MED ORDER — ALUM & MAG HYDROXIDE-SIMETH 200-200-20 MG/5ML PO SUSP
30.0000 mL | Freq: Once | ORAL | Status: AC
  Administered 2019-11-14: 30 mL via ORAL
  Filled 2019-11-14: qty 30

## 2019-11-14 MED ORDER — ALBUTEROL SULFATE HFA 108 (90 BASE) MCG/ACT IN AERS
2.0000 | INHALATION_SPRAY | Freq: Once | RESPIRATORY_TRACT | Status: AC
Start: 2019-11-14 — End: 2019-11-14
  Administered 2019-11-14: 2 via RESPIRATORY_TRACT
  Filled 2019-11-14: qty 6.7

## 2019-11-14 MED ORDER — IBUPROFEN 800 MG PO TABS: 800.0000 mg | ORAL_TABLET | Freq: Three times a day (TID) | ORAL | 0 refills | Status: DC | PRN

## 2019-11-14 MED ORDER — MORPHINE SULFATE (PF) 4 MG/ML IV SOLN
4.0000 mg | Freq: Once | INTRAVENOUS | Status: AC
  Administered 2019-11-14: 4 mg via INTRAVENOUS
  Filled 2019-11-14: qty 1

## 2019-11-14 MED ORDER — LIDOCAINE VISCOUS HCL 2 % MT SOLN
15.0000 mL | Freq: Once | OROMUCOSAL | Status: AC
  Administered 2019-11-14: 15 mL via ORAL
  Filled 2019-11-14: qty 15

## 2019-11-14 NOTE — ED Triage Notes (Signed)
Pt c/o chest pain and sob since Sunday.

## 2019-11-14 NOTE — Discharge Instructions (Addendum)
Your work up today does not show any evidence of a blood clot Your blood work shows that your are more anemic than before - please follow up with your doctor about this Continue Tylenol or Ibuprofen for pain as needed Use inhaler as needed for shortness of breath

## 2019-11-14 NOTE — ED Provider Notes (Signed)
[facility] EMERGENCY DEPARTMENT Provider Note   CSN: [id number] Arrival date & time: 11/14/19  2038     History Chief Complaint  Patient presents with  . Chest Pain    Donna Douglas is a 39 y.o. female with history of DVT and PE on Coumadin who presents with chest pain and shortness of breath.  Patient states that she has had chest pain that is been coming and going for 2 days.  It is over the central part of her chest and sometimes radiates to the right arm.  Pain is sharp, severe.  She thinks it may feel similar as to when she has had a blood clot before.  She reports compliance with her Coumadin and has an IVC filter.  She reports associated shortness of breath.  She denies fever, chills, cough, abdominal pain, nausea, vomiting.  She thinks that her left leg is a little bit swollen.  She was lying down tonight and could not get comfortable therefore decided to come to the ED.  HPI     Past Medical History:  Diagnosis Date  . Asthma   . Diabetes mellitus without complication (HCC)   . DVT (deep venous thrombosis) (HCC)   . Pulmonary emboli (HCC)   . Sleep apnea   . Thyroid disease     Patient Active Problem List   Diagnosis Date Noted  . S/P right oophorectomy 03/01/2018  . Pelvic pain in female   . Torsion of right ovary and ovarian pedicle   . Hemoperitoneum, nontraumatic   . [redacted] weeks gestation of pregnancy   . Pleurisy   . Chest pain 04/16/2017  . Intrauterine pregnancy 04/16/2017  . 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 DM (diabetes mellitus) (HCC) 03/23/2016  . Thyroid condition 03/23/2016  . Pulmonary emboli (HCC) 01/05/2016  . Bilateral pulmonary embolism (HCC) 01/05/2016    Past Surgical History:  Procedure Laterality Date  . anxiety    . IVC FILTER PLACEMENT ([facility] HX)    . SALPINGOOPHORECTOMY Right 03/01/2018   Procedure: LAPARATOMY WITH  SALPINGO OOPHORECTOMY RIGHT SIDE;  Surgeon: Lazaro Arms, MD;  Location: [facility];  Service: Gynecology;  Laterality: Right;     OB History    Gravida  8   Para  8   Term      Preterm      AB      Living  8     SAB      TAB      Ectopic      Multiple      Live Births              Family History  Problem Relation Age of Onset  . Asthma Father   . Diabetes Mother   . Hypertension Mother     Social History   Tobacco Use  . Smoking status: Current Every Day Smoker    Packs/day: 1.50    Types: Cigarettes  . Smokeless tobacco: Never Used  Substance Use Topics  . Alcohol use: No  . Drug use: Not Currently    Types: Marijuana    Comment: denies use 04/06/18    Home Medications Prior to Admission medications   Medication Sig Start Date End Date Taking? Authorizing Provider  acetaminophen (TYLENOL) 500 MG tablet Take 1 tablet (500 mg total) by mouth every 6 (six) hours as needed. 04/07/18  Rancour, Jeannett Senior, MD  albuterol (VENTOLIN HFA) 108 (90 Base) MCG/ACT inhaler Inhale 2 puffs into the lungs every 6 (six) hours as needed for wheezing or shortness of breath. 04/17/17   Catarina Hartshorn, MD  ibuprofen (ADVIL,MOTRIN) 400 MG tablet Take 1 tablet (400 mg total) by mouth every 6 (six) hours as needed. Patient not taking: Reported on 04/06/2018 03/13/18   Mesner, Barbara Cower, MD  methocarbamol (ROBAXIN) 500 MG tablet Take 1 or 2 po Q 6hrs for pain Patient not taking: Reported on 04/18/2018 03/19/18   Devoria Albe, MD  promethazine (PHENERGAN) 25 MG tablet Take 1 tablet (25 mg total) by mouth every 6 (six) hours as needed for nausea. 03/02/18   Lazaro Arms, MD  traMADol (ULTRAM) 50 MG tablet Take 1 tablet (50 mg total) by mouth every 6 (six) hours as needed. 09/20/19   Bethann Berkshire, MD  warfarin (COUMADIN) 10 MG tablet Take 10 mg by mouth every evening. as directed 01/19/18   [provider]    Allergies    Patient has no known allergies.  Review of Systems   Review of  Systems  Constitutional: Negative for chills and fever.  Respiratory: Positive for shortness of breath. Negative for cough and wheezing.   Cardiovascular: Positive for chest pain and leg swelling. Negative for palpitations.  Gastrointestinal: Negative for abdominal pain, nausea and vomiting.  Neurological: Negative for syncope and light-headedness.  All other systems reviewed and are negative.   Physical Exam Updated Vital Signs BP 129/70   Pulse 88   Temp 98.7 F (37.1 C)   Resp 20   Ht 5\' 5"  (1.651 m)   Wt 121.6 kg   LMP 10/16/2019   SpO2 97%   BMI 44.60 kg/m   Physical Exam Vitals and nursing note reviewed.  Constitutional:      General: She is not in acute distress.    Appearance: Normal appearance. She is well-developed. She is obese. She is not ill-appearing.     Comments: Calm and cooperative  HENT:     Head: Normocephalic and atraumatic.  Eyes:     General: No scleral icterus.       Right eye: No discharge.        Left eye: No discharge.     Conjunctiva/sclera: Conjunctivae normal.     Pupils: Pupils are equal, round, and reactive to light.  Cardiovascular:     Rate and Rhythm: Normal rate and regular rhythm.  Pulmonary:     Effort: Pulmonary effort is normal. No respiratory distress.     Breath sounds: Normal breath sounds.  Abdominal:     General: There is no distension.     Palpations: Abdomen is soft.     Tenderness: There is no abdominal tenderness.  Musculoskeletal:     Cervical back: Normal range of motion.     Right lower leg: No edema.     Left lower leg: No edema.  Skin:    General: Skin is warm and dry.  Neurological:     Mental Status: She is alert and oriented to person, place, and time.  Psychiatric:        Behavior: Behavior normal.     ED Results / Procedures / Treatments   Labs (all labs ordered are listed, but only abnormal results are displayed) Labs Reviewed  PROTIME-INR - Abnormal; Notable for the following components:       Result Value   Prothrombin Time 15.3 (*)    All other components within normal  limits  BASIC METABOLIC PANEL - Abnormal; Notable for the following components:   Glucose, Bld 122 (*)    Calcium 8.8 (*)    All other components within normal limits  CBC - Abnormal; Notable for the following components:   Hemoglobin 8.6 (*)    HCT 31.1 (*)    MCV 69.6 (*)    MCH 19.2 (*)    MCHC 27.7 (*)    RDW 19.4 (*)    Platelets 470 (*)    All other components within normal limits  D-DIMER, QUANTITATIVE (NOT AT [facility])  TROPONIN I (HIGH SENSITIVITY)  TROPONIN I (HIGH SENSITIVITY)    EKG None  Radiology DG Chest Port 1 View  Result Date: 11/14/2019 CLINICAL DATA:  Chest pain EXAM: PORTABLE CHEST 1 VIEW COMPARISON:  04/06/2018 FINDINGS: The heart size and mediastinal contours are within normal limits. Both lungs are clear. The visualized skeletal structures are unremarkable. IMPRESSION: No active disease. Electronically Signed   By: Deatra Robinson M.D.   On: 11/14/2019 21:42    Procedures Procedures (including critical care time)  Medications Ordered in ED Medications  ketorolac (TORADOL) 15 MG/ML injection 15 mg (15 mg Intravenous Not Given 11/14/19 2346)  morphine 4 MG/ML injection 4 mg (4 mg Intravenous Given 11/14/19 2139)  alum & mag hydroxide-simeth (MAALOX/MYLANTA) 200-200-20 MG/5ML suspension 30 mL (30 mLs Oral Given 11/14/19 2139)    And  lidocaine (XYLOCAINE) 2 % viscous mouth solution 15 mL (15 mLs Oral Given 11/14/19 2139)  ondansetron (ZOFRAN) injection 4 mg (4 mg Intravenous Given 11/14/19 2139)  albuterol (VENTOLIN HFA) 108 (90 Base) MCG/ACT inhaler 2 puff (2 puffs Inhalation Given 11/14/19 2344)  HYDROcodone-acetaminophen (NORCO/VICODIN) 5-325 MG per tablet 1 tablet (1 tablet Oral Given 11/15/19 0005)    ED Course  I have reviewed the triage vital signs and the nursing notes.  Pertinent labs & imaging results that were available during my care of the patient were reviewed by me and  considered in my medical decision making (see chart for details).  39 year old female presents with chest pain and shortness of breath.  Pain is atypical in nature has been going on for 2 days.  She has been concerned about a PE due to her prior history.  Her vital signs are reassuring.  Heart is regular rate and rhythm.  Lungs are clear to auscultation.  Leg swelling is difficult to assess due to her body habitus but I do not see any obvious asymmetric swelling.  EKG is sinus rhythm.  Will obtain labs, D-dimer, chest x-ray.  Patient is repeatedly asking for something "strong" to knock out her pain.  CBC is remarkable for anemia which is slightly worse than baseline (hgb 8.6).  She has had chronic anemia and it has been this low before.  BMP is unremarkable.  Troponin is less than 2.  D-dimer is 0.47.  INR is subtherapeutic 1.2.  Chest x-ray is negative.  On reevaluation patient is still reporting pain.  Suspect atypical because of pain.  Possibly from smoking, musculoskeletal pain, GI etiology.  Patient was given prescription for ibuprofen and an inhaler to use as needed for shortness of breath.  She was encouraged to stop smoking and to f/u with her PCP  MDM Rules/Calculators/A&P                       Final Clinical Impression(s) / ED Diagnoses Final diagnoses:  Nonspecific chest pain  Shortness of breath    Rx /  DC Orders ED Discharge Orders    None       Recardo Evangelist, PA-C 11/15/19 0009    Lucrezia Starch, MD 11/18/19 [phone]

## 2019-11-15 MED ORDER — HYDROCODONE-ACETAMINOPHEN 5-325 MG PO TABS
1.0000 | ORAL_TABLET | Freq: Once | ORAL | Status: AC
  Administered 2019-11-15: 1 via ORAL
  Filled 2019-11-15: qty 1

## 2020-05-26 ENCOUNTER — Emergency Department (HOSPITAL_COMMUNITY)
Admission: EM | Admit: 2020-05-26 | Discharge: 2020-05-26 | Disposition: A | Discharge status: Home / Self Care | Payer: Medicaid - Out of State | Attending: Emergency Medicine | Admitting: Emergency Medicine

## 2020-05-26 ENCOUNTER — Encounter (HOSPITAL_COMMUNITY): Payer: Self-pay | Admitting: Emergency Medicine

## 2020-05-26 ENCOUNTER — Emergency Department (HOSPITAL_COMMUNITY): Payer: Medicaid - Out of State

## 2020-05-26 ENCOUNTER — Other Ambulatory Visit: Payer: Self-pay

## 2020-05-26 DIAGNOSIS — I2693 Single subsegmental pulmonary embolism without acute cor pulmonale: Secondary | ICD-10-CM | POA: Insufficient documentation

## 2020-05-26 DIAGNOSIS — J45909 Unspecified asthma, uncomplicated: Secondary | ICD-10-CM | POA: Diagnosis not present

## 2020-05-26 DIAGNOSIS — M791 Myalgia, unspecified site: Secondary | ICD-10-CM | POA: Diagnosis present

## 2020-05-26 DIAGNOSIS — E119 Type 2 diabetes mellitus without complications: Secondary | ICD-10-CM | POA: Diagnosis not present

## 2020-05-26 DIAGNOSIS — F17210 Nicotine dependence, cigarettes, uncomplicated: Secondary | ICD-10-CM | POA: Diagnosis not present

## 2020-05-26 DIAGNOSIS — Z7901 Long term (current) use of anticoagulants: Secondary | ICD-10-CM | POA: Diagnosis not present

## 2020-05-26 DIAGNOSIS — U071 COVID-19: Secondary | ICD-10-CM | POA: Diagnosis not present

## 2020-05-26 LAB — COMPREHENSIVE METABOLIC PANEL
ALT: 21 U/L (ref 0–44)
AST: 18 U/L (ref 15–41)
Albumin: 3.5 g/dL (ref 3.5–5.0)
Alkaline Phosphatase: 89 U/L (ref 38–126)
Anion gap: 6 (ref 5–15)
BUN: 10 mg/dL (ref 6–20)
CO2: 26 mmol/L (ref 22–32)
Calcium: 8.6 mg/dL — ABNORMAL LOW (ref 8.9–10.3)
Chloride: 103 mmol/L (ref 98–111)
Creatinine, Ser: 0.71 mg/dL (ref 0.44–1.00)
GFR calc Af Amer: >60 mL/min (ref >60)
GFR calc non Af Amer: >60 mL/min (ref >60)
Glucose, Bld: 126 mg/dL — ABNORMAL HIGH (ref 70–99)
Potassium: 3.8 mmol/L (ref 3.5–5.1)
Sodium: 135 mmol/L (ref 135–145)
Total Bilirubin: 0.4 mg/dL (ref 0.3–1.2)
Total Protein: 7.4 g/dL (ref 6.5–8.1)

## 2020-05-26 LAB — CBC WITH DIFFERENTIAL/PLATELET
Abs Immature Granulocytes: 0.01 10*3/uL (ref 0.00–0.07)
Basophils Absolute: 0 10*3/uL (ref 0.0–0.1)
Basophils Relative: 0 %
Eosinophils Absolute: 0 10*3/uL (ref 0.0–0.5)
Eosinophils Relative: 1 %
HCT: 32.6 % — ABNORMAL LOW (ref 36.0–46.0)
Hemoglobin: 9.5 g/dL — ABNORMAL LOW (ref 12.0–15.0)
Immature Granulocytes: 0 %
Lymphocytes Relative: 25 %
Lymphs Abs: 1.2 10*3/uL (ref 0.7–4.0)
MCH: 21.2 pg — ABNORMAL LOW (ref 26.0–34.0)
MCHC: 29.1 g/dL — ABNORMAL LOW (ref 30.0–36.0)
MCV: 72.8 fL — ABNORMAL LOW (ref 80.0–100.0)
Monocytes Absolute: 0.3 10*3/uL (ref 0.1–1.0)
Monocytes Relative: 7 %
Neutro Abs: 3.2 10*3/uL (ref 1.7–7.7)
Neutrophils Relative %: 67 %
Platelets: 433 10*3/uL — ABNORMAL HIGH (ref 150–400)
RBC: 4.48 MIL/uL (ref 3.87–5.11)
RDW: 18.6 % — ABNORMAL HIGH (ref 11.5–15.5)
WBC: 4.7 10*3/uL (ref 4.0–10.5)
nRBC: 0 % (ref 0.0–0.2)

## 2020-05-26 LAB — RESPIRATORY PANEL BY RT PCR (FLU A&B, COVID)
Influenza A by PCR: NEGATIVE
Influenza B by PCR: NEGATIVE
SARS Coronavirus 2 by RT PCR: POSITIVE — AB

## 2020-05-26 LAB — CBG MONITORING, ED: Glucose-Capillary: 143 mg/dL — ABNORMAL HIGH (ref 70–99)

## 2020-05-26 LAB — PROTIME-INR
INR: 1.4 — ABNORMAL HIGH (ref 0.8–1.2)
Prothrombin Time: 16.5 seconds — ABNORMAL HIGH (ref 11.4–15.2)

## 2020-05-26 LAB — TROPONIN I (HIGH SENSITIVITY): Troponin I (High Sensitivity): 2 ng/L (ref <18)

## 2020-05-26 MED ORDER — IBUPROFEN 800 MG PO TABS
800.0000 mg | ORAL_TABLET | Freq: Once | ORAL | Status: DC
  Filled 2020-05-26: qty 1

## 2020-05-26 MED ORDER — MORPHINE SULFATE (PF) 2 MG/ML IV SOLN
2.0000 mg | Freq: Once | INTRAVENOUS | Status: AC
  Administered 2020-05-26: 2 mg via INTRAVENOUS
  Filled 2020-05-26: qty 1

## 2020-05-26 MED ORDER — ONDANSETRON HCL 4 MG/2ML IJ SOLN
4.0000 mg | Freq: Once | INTRAMUSCULAR | Status: AC
  Administered 2020-05-26: 4 mg via INTRAVENOUS
  Filled 2020-05-26: qty 2

## 2020-05-26 MED ORDER — IOHEXOL 350 MG/ML SOLN
100.0000 mL | Freq: Once | INTRAVENOUS | Status: AC | PRN
  Administered 2020-05-26: 100 mL via INTRAVENOUS

## 2020-05-26 NOTE — ED Notes (Signed)
Attempted to give pt. Ibuprofen. Pt. Refused the medication. Notified MD.

## 2020-05-26 NOTE — ED Provider Notes (Signed)
[facility] EMERGENCY DEPARTMENT Provider Note   CSN: [id number] Arrival date & time: 05/26/20  1535     History Chief Complaint  Patient presents with  . Shortness of Breath    Donna Douglas is a 39 y.o. female who  has a past medical history of Asthma, Diabetes mellitus without complication (HCC), DVT (deep venous thrombosis) (HCC), Pulmonary emboli (HCC), Sleep apnea, and Thyroid disease. She has a hx of Protein C and S deficiency, is a daily smoker, is not vaccinated for COVID and has had a recent exposure at home.  She complains of 3 days of body aches, rhinorrhea, cough and pleuritic left-sided chest pain.  She has poor appetite and is unsure if she has been running a fever.  She is concerned that she might have well once 13 and 160s so no other no and then the other 1 yes but I am not doing it here because I already gave her that a good reason I do not know about the clinic thank limits positive and I figured, reach out to the there are positive [facility] positive the infusion clinic something else oh yeah they are old enough to get this get it anyway you know say see if one of his heart rates normal because it does not anyway  HPI     Past Medical History:  Diagnosis Date  . Asthma   . Diabetes mellitus without complication (HCC)   . DVT (deep venous thrombosis) (HCC)   . Pulmonary emboli (HCC)   . Sleep apnea   . Thyroid disease     Patient Active Problem List   Diagnosis Date Noted  . S/P right oophorectomy 03/01/2018  . Pelvic pain in female   . Torsion of right ovary and ovarian pedicle   . Hemoperitoneum, nontraumatic   . [redacted] weeks gestation of pregnancy   . Pleurisy   . Chest pain 04/16/2017  . Intrauterine pregnancy 04/16/2017  . 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 DM (diabetes mellitus) (HCC) 03/23/2016  . Thyroid condition 03/23/2016  .  Pulmonary emboli (HCC) 01/05/2016  . Bilateral pulmonary embolism (HCC) 01/05/2016    Past Surgical History:  Procedure Laterality Date  . anxiety    . IVC FILTER PLACEMENT ([facility] HX)    . SALPINGOOPHORECTOMY Right 03/01/2018   Procedure: LAPARATOMY WITH SALPINGO OOPHORECTOMY RIGHT SIDE;  Surgeon: Lazaro Arms, MD;  Location: [facility];  Service: Gynecology;  Laterality: Right;     OB History    Gravida  8   Para  8   Term      Preterm      AB      Living  8     SAB      TAB      Ectopic      Multiple      Live Births              Family History  Problem Relation Age of Onset  . Asthma Father   . Diabetes Mother   . Hypertension Mother     Social History   Tobacco Use  . Smoking status: Current Every Day Smoker    Packs/day: 0.50    Types: Cigarettes  . Smokeless tobacco: Never Used  Vaping Use  . Vaping Use: Never used  Substance Use Topics  . Alcohol use: No  . Drug use: Not  Currently    Types: Marijuana    Comment: denies use 04/06/18    Home Medications Prior to Admission medications   Medication Sig Start Date End Date Taking? Authorizing Provider  albuterol (VENTOLIN HFA) 108 (90 Base) MCG/ACT inhaler Inhale 2 puffs into the lungs every 6 (six) hours as needed for wheezing or shortness of breath. 04/17/17   Catarina Hartshorn, MD  ibuprofen (ADVIL) 800 MG tablet Take 1 tablet (800 mg total) by mouth every 8 (eight) hours as needed. 11/14/19   Bethel Born, PA-C  Meclizine HCl 25 MG CHEW Chew 1 tablet by mouth daily as needed for dizziness. 10/08/19   [provider]  warfarin (COUMADIN) 5 MG tablet Take 5 mg by mouth daily. 11/13/19   [provider]    Allergies    Patient has no known allergies.  Review of Systems   Review of Systems  Physical Exam Updated Vital Signs BP 133/73 (BP Location: Right Arm)   Pulse 94   Temp 99.3 F (37.4 C) (Oral)   Resp 20   Ht 5\' 5"  (1.651 m)   Wt 122.5 kg   LMP 04/14/2020  (Approximate)   SpO2 100%   BMI 44.93 kg/m   Physical Exam  ED Results / Procedures / Treatments   Labs (all labs ordered are listed, but only abnormal results are displayed) Labs Reviewed  RESPIRATORY PANEL BY RT PCR (FLU A&B, COVID) - Abnormal; Notable for the following components:      Result Value   SARS Coronavirus 2 by RT PCR POSITIVE (*)    All other components within normal limits  CBC WITH DIFFERENTIAL/PLATELET - Abnormal; Notable for the following components:   Hemoglobin 9.5 (*)    HCT 32.6 (*)    MCV 72.8 (*)    MCH 21.2 (*)    MCHC 29.1 (*)    RDW 18.6 (*)    Platelets 433 (*)    All other components within normal limits  COMPREHENSIVE METABOLIC PANEL - Abnormal; Notable for the following components:   Glucose, Bld 126 (*)    Calcium 8.6 (*)    All other components within normal limits  PROTIME-INR - Abnormal; Notable for the following components:   Prothrombin Time 16.5 (*)    INR 1.4 (*)    All other components within normal limits  CBG MONITORING, ED - Abnormal; Notable for the following components:   Glucose-Capillary 143 (*)    All other components within normal limits  TROPONIN I (HIGH SENSITIVITY)  TROPONIN I (HIGH SENSITIVITY)    EKG None  Radiology DG Chest Portable 1 View  Result Date: 05/26/2020 CLINICAL DATA:  Chest pain and shortness of breath. Cough for 3 days. Sinus COVID positive. EXAM: PORTABLE CHEST 1 VIEW COMPARISON:  Radiograph 11/14/2019 FINDINGS: Upper normal heart size with unchanged mediastinal contours. Mild peribronchial thickening. Subsegmental opacity at the left lung base. No pneumothorax or pleural effusion. No evidence of pneumomediastinum. No acute osseous abnormalities are seen. IMPRESSION: 1. Subsegmental opacity at the left lung base, may be atelectasis or pneumonia. 2. Borderline cardiomegaly. Electronically Signed   By: 11/16/2019 M.D.   On: 05/26/2020 16:37    Procedures Procedures (including critical care  time)  Medications Ordered in ED Medications  ibuprofen (ADVIL) tablet 800 mg (800 mg Oral Refused 05/26/20 1720)    ED Course  I have reviewed the triage vital signs and the nursing notes.  Pertinent labs & imaging results that were available during my care of  the patient were reviewed by me and considered in my medical decision making (see chart for details).    MDM Rules/Calculators/A&P                          CC:flu like sxs /cough and chest pain VS:  QT:MAUQJFH is gathered by patient and emr. Previous records obtained and reviewed. DDX:The patient's complaint of chest pain involves an extensive number of diagnostic and treatment options, and is a complaint that carries with it a high risk of complications, morbidity, and potential mortality. Given the large differential diagnosis, medical decision making is of high complexity. The emergent differential diagnosis of chest pain includes: Acute coronary syndrome, pericarditis, aortic dissection, pulmonary embolism, tension pneumothorax, pneumonia, and esophageal rupture.   Labs: I ordered reviewed and interpreted labs which include Negative troponin, PT/INR therapeutic.  CMP shows elevated blood glucose, CBC with microcytic anemia, Covid test positive.  Imaging: I ordered and reviewed images which included cxr and cta PE chest. I independently visualized and interpreted all imaging. CXR initially appeared to show pneumonia, however CTA showed no evidence of such and no evidence of . There are no acute, significant findings on today's images. EKG: NSR at a rate of 90 Consults: LKT:GYBWLSL with COVID 19 infection. High risk due to obesity, smoking, htn. I have referred to [facility] infusion center. Patient otherwise stable and appropriate for discharge with return precautions. Patient disposition:The patient appears reasonably screened and/or stabilized for discharge and I doubt any other medical condition or other [facility] requiring further  screening, evaluation, or treatment in the ED at this time prior to discharge. I have discussed lab and/or imaging findings with the patient and answered all questions/concerns to the best of my ability.I have discussed return precautions and OP follow up.     Ai Sonnenfeld was evaluated in Emergency Department on 05/26/2020 for the symptoms described in the history of present illness. She was evaluated in the context of the global COVID-19 pandemic, which necessitated consideration that the patient might be at risk for infection with the SARS-CoV-2 virus that causes COVID-19. Institutional protocols and algorithms that pertain to the evaluation of patients at risk for COVID-19 are in a state of rapid change based on information released by regulatory bodies including the CDC and federal and state organizations. These policies and algorithms were followed during the patient's care in the ED.  Final Clinical Impression(s) / ED Diagnoses Final diagnoses:  COVID-19 virus infection    Rx / DC Orders ED Discharge Orders    None       Arthor Captain, PA-C 05/26/20 Rosalio Macadamia, MD 05/28/20 1022

## 2020-05-26 NOTE — Discharge Instructions (Addendum)
Your CT scan showed NO clots and you DO NOT have pnuemonia it was a false read on your chest xray.      Person Under Monitoring Name: Donna Douglas  Location: [address] [place] [address]   Infection Prevention Recommendations for Individuals Confirmed to have, or Being Evaluated for, 2019 Novel Coronavirus (COVID-19) Infection Who Receive Care at Home  Individuals who are confirmed to have, or are being evaluated for, COVID-19 should follow the prevention steps below until a healthcare provider or local or state health department says they can return to normal activities.  Stay home except to get medical care You should restrict activities outside your home, except for getting medical care. Do not go to work, school, or public areas, and do not use public transportation or taxis.  Call ahead before visiting your doctor Before your medical appointment, call the healthcare provider and tell them that you have, or are being evaluated for, COVID-19 infection. This will help the healthcare provider's office take steps to keep other people from getting infected. Ask your healthcare provider to call the local or state health department.  Monitor your symptoms Seek prompt medical attention if your illness is worsening (e.g., difficulty breathing). Before going to your medical appointment, call the healthcare provider and tell them that you have, or are being evaluated for, COVID-19 infection. Ask your healthcare provider to call the local or state health department.  Wear a facemask You should wear a facemask that covers your nose and mouth when you are in the same room with other people and when you visit a healthcare provider. People who live with or visit you should also wear a facemask while they are in the same room with you.  Separate yourself from other people in your home As much as possible, you should stay in a different room from other people in your home. Also, you  should use a separate bathroom, if available.  Avoid sharing household items You should not share dishes, drinking glasses, cups, eating utensils, towels, bedding, or other items with other people in your home. After using these items, you should wash them thoroughly with soap and water.  Cover your coughs and sneezes Cover your mouth and nose with a tissue when you cough or sneeze, or you can cough or sneeze into your sleeve. Throw used tissues in a lined trash can, and immediately wash your hands with soap and water for at least 20 seconds or use an alcohol-based hand rub.  Wash your [employer] your hands often and thoroughly with soap and water for at least 20 seconds. You can use an alcohol-based hand sanitizer if soap and water are not available and if your hands are not visibly dirty. Avoid touching your eyes, nose, and mouth with unwashed hands.   Prevention Steps for Caregivers and Household Members of Individuals Confirmed to have, or Being Evaluated for, COVID-19 Infection Being Cared for in the Home  If you live with, or provide care at home for, a person confirmed to have, or being evaluated for, COVID-19 infection please follow these guidelines to prevent infection:  Follow healthcare provider's instructions Make sure that you understand and can help the patient follow any healthcare provider instructions for all care.  Provide for the patient's basic needs You should help the patient with basic needs in the home and provide support for getting groceries, prescriptions, and other personal needs.  Monitor the patient's symptoms If they are getting sicker, call his or her  medical provider and tell them that the patient has, or is being evaluated for, COVID-19 infection. This will help the healthcare provider's office take steps to keep other people from getting infected. Ask the healthcare provider to call the local or state health department.  Limit the number of  people who have contact with the patient If possible, have only one caregiver for the patient. Other household members should stay in another home or place of residence. If this is not possible, they should stay in another room, or be separated from the patient as much as possible. Use a separate bathroom, if available. Restrict visitors who do not have an essential need to be in the home.  Keep older adults, very young children, and other sick people away from the patient Keep older adults, very young children, and those who have compromised immune systems or chronic health conditions away from the patient. This includes people with chronic heart, lung, or kidney conditions, diabetes, and cancer.  Ensure good ventilation Make sure that shared spaces in the home have good air flow, such as from an air conditioner or an opened window, weather permitting.  Wash your hands often Wash your hands often and thoroughly with soap and water for at least 20 seconds. You can use an alcohol based hand sanitizer if soap and water are not available and if your hands are not visibly dirty. Avoid touching your eyes, nose, and mouth with unwashed hands. Use disposable paper towels to dry your hands. If not available, use dedicated cloth towels and replace them when they become wet.  Wear a facemask and gloves Wear a disposable facemask at all times in the room and gloves when you touch or have contact with the patient's blood, body fluids, and/or secretions or excretions, such as sweat, saliva, sputum, nasal mucus, vomit, urine, or feces.  Ensure the mask fits over your nose and mouth tightly, and do not touch it during use. Throw out disposable facemasks and gloves after using them. Do not reuse. Wash your hands immediately after removing your facemask and gloves. If your personal clothing becomes contaminated, carefully remove clothing and launder. Wash your hands after handling contaminated clothing. Place  all used disposable facemasks, gloves, and other waste in a lined container before disposing them with other household waste. Remove gloves and wash your hands immediately after handling these items.  Do not share dishes, glasses, or other household items with the patient Avoid sharing household items. You should not share dishes, drinking glasses, cups, eating utensils, towels, bedding, or other items with a patient who is confirmed to have, or being evaluated for, COVID-19 infection. After the person uses these items, you should wash them thoroughly with soap and water.  Wash laundry thoroughly Immediately remove and wash clothes or bedding that have blood, body fluids, and/or secretions or excretions, such as sweat, saliva, sputum, nasal mucus, vomit, urine, or feces, on them. Wear gloves when handling laundry from the patient. Read and follow directions on labels of laundry or clothing items and detergent. In general, wash and dry with the warmest temperatures recommended on the label.  Clean all areas the individual has used often Clean all touchable surfaces, such as counters, tabletops, doorknobs, bathroom fixtures, toilets, phones, keyboards, tablets, and bedside tables, every day. Also, clean any surfaces that may have blood, body fluids, and/or secretions or excretions on them. Wear gloves when cleaning surfaces the patient has come in contact with. Use a diluted bleach solution (e.g., dilute bleach with  1 part bleach and 10 parts water) or a household disinfectant with a label that says EPA-registered for coronaviruses. To make a bleach solution at home, add 1 tablespoon of bleach to 1 quart (4 cups) of water. For a larger supply, add  cup of bleach to 1 gallon (16 cups) of water. Read labels of cleaning products and follow recommendations provided on product labels. Labels contain instructions for safe and effective use of the cleaning product including precautions you should take when  applying the product, such as wearing gloves or eye protection and making sure you have good ventilation during use of the product. Remove gloves and wash hands immediately after cleaning.  Monitor yourself for signs and symptoms of illness Caregivers and household members are considered close contacts, should monitor their health, and will be asked to limit movement outside of the home to the extent possible. Follow the monitoring steps for close contacts listed on the symptom monitoring form.   ? If you have additional questions, contact your local health department or call the epidemiologist on call at [phone] (available 24/7). ? This guidance is subject to change. For the most up-to-date guidance from [facility], please refer to their website: [email or web address]

## 2020-05-26 NOTE — ED Triage Notes (Signed)
Pt's son has COVID. Pt c/o slight cough, SOB, and chest pain x 3 days. Denies fever.

## 2020-05-27 ENCOUNTER — Telehealth: Payer: Self-pay | Admitting: Physician Assistant

## 2020-05-27 ENCOUNTER — Ambulatory Visit (HOSPITAL_COMMUNITY)
Admission: RE | Admit: 2020-05-27 | Discharge: 2020-05-27 | Disposition: A | Discharge status: Home / Self Care | Payer: Medicaid - Out of State | Source: Ambulatory Visit | Attending: Pulmonary Disease | Admitting: Pulmonary Disease

## 2020-05-27 ENCOUNTER — Other Ambulatory Visit: Payer: Self-pay | Admitting: Physician Assistant

## 2020-05-27 DIAGNOSIS — U071 COVID-19: Secondary | ICD-10-CM | POA: Insufficient documentation

## 2020-05-27 DIAGNOSIS — E119 Type 2 diabetes mellitus without complications: Secondary | ICD-10-CM

## 2020-05-27 DIAGNOSIS — I2699 Other pulmonary embolism without acute cor pulmonale: Secondary | ICD-10-CM

## 2020-05-27 DIAGNOSIS — I82402 Acute embolism and thrombosis of unspecified deep veins of left lower extremity: Secondary | ICD-10-CM

## 2020-05-27 MED ORDER — ALBUTEROL SULFATE HFA 108 (90 BASE) MCG/ACT IN AERS: 2.0000 | INHALATION_SPRAY | Freq: Once | RESPIRATORY_TRACT | Status: DC | PRN

## 2020-05-27 MED ORDER — SODIUM CHLORIDE 0.9 % IV SOLN: INTRAVENOUS | Status: DC | PRN

## 2020-05-27 MED ORDER — EPINEPHRINE 0.3 MG/0.3ML IJ SOAJ: 0.3000 mg | Freq: Once | INTRAMUSCULAR | Status: DC | PRN

## 2020-05-27 MED ORDER — METHYLPREDNISOLONE SODIUM SUCC 125 MG IJ SOLR: 125.0000 mg | Freq: Once | INTRAMUSCULAR | Status: DC | PRN

## 2020-05-27 MED ORDER — FAMOTIDINE IN NACL 20-0.9 MG/50ML-% IV SOLN: 20.0000 mg | Freq: Once | INTRAVENOUS | Status: DC | PRN

## 2020-05-27 MED ORDER — SODIUM CHLORIDE 0.9 % IV SOLN
1200.0000 mg | Freq: Once | INTRAVENOUS | Status: AC
  Administered 2020-05-27: 1200 mg via INTRAVENOUS

## 2020-05-27 MED ORDER — ACETAMINOPHEN 325 MG PO TABS
650.0000 mg | ORAL_TABLET | Freq: Once | ORAL | Status: AC
  Administered 2020-05-27: 650 mg via ORAL
  Filled 2020-05-27: qty 2

## 2020-05-27 MED ORDER — DIPHENHYDRAMINE HCL 50 MG/ML IJ SOLN: 50.0000 mg | Freq: Once | INTRAMUSCULAR | Status: DC | PRN

## 2020-05-27 NOTE — Telephone Encounter (Signed)
  Called to discuss with patient about Covid symptoms and the use of casirivimab/imdevimab, a monoclonal antibody infusion for those with mild to moderate Covid symptoms and at a high risk of hospitalization.  Pt is qualified for this infusion at the [facility] infusion center due  "BMI 44, tobacco abuse, HTN, DM, DVT" based on hotline message.       Called twice but no one has picked up phone. No voice mail or my chart has been set up.  Manson Passey, PA - C

## 2020-05-27 NOTE — Discharge Instructions (Signed)

## 2020-05-27 NOTE — Progress Notes (Signed)
  Diagnosis: COVID-19  Physician: dr Luisa Hart wrighjt  Procedure: [facility] Infusion [facility] Med: casirivimab\imdevimab infusion - Provided patient with casirivimab\imdevimab fact sheet for patients, parents and caregivers prior to infusion.  Complications: No immediate complications noted.  Discharge: Discharged home   Shaune Spittle 05/27/2020

## 2020-05-27 NOTE — Progress Notes (Signed)
I connected by phone with Donna Douglas on 05/27/2020 at 11:04 AM to discuss the potential use of a new treatment for mild to moderate COVID-19 viral infection in non-hospitalized patients.  This patient is a 39 y.o. female that meets the FDA criteria for Emergency Use Authorization of COVID monoclonal antibody casirivimab/imdevimab or bamlanivimab/eteseviamb.  Has a (+) direct SARS-CoV-2 viral test result  Has mild or moderate COVID-19   Is NOT hospitalized due to COVID-19  Is within 10 days of symptom onset  Has at least one of the high risk factor(s) for progression to severe COVID-19 and/or hospitalization as defined in EUA.  Specific high risk criteria : BMI > 25, Diabetes and Chronic Lung Disease   I have spoken and communicated the following to the patient or parent/caregiver regarding COVID monoclonal antibody treatment:  1. FDA has authorized the emergency use for the treatment of mild to moderate COVID-19 in adults and pediatric patients with positive results of direct SARS-CoV-2 viral testing who are 16 years of age and older weighing at least 40 kg, and who are at high risk for progressing to severe COVID-19 and/or hospitalization.  2. The significant known and potential risks and benefits of COVID monoclonal antibody, and the extent to which such potential risks and benefits are unknown.  3. Information on available alternative treatments and the risks and benefits of those alternatives, including clinical trials.  4. Patients treated with COVID monoclonal antibody should continue to self-isolate and use infection control measures (e.g., wear mask, isolate, social distance, avoid sharing personal items, clean and disinfect "high touch" surfaces, and frequent handwashing) according to CDC guidelines.   5. The patient or parent/caregiver has the option to accept or refuse COVID monoclonal antibody treatment.  After reviewing this information with the patient, the patient has  agreed to receive one of the available covid 19 monoclonal antibodies and will be provided an appropriate fact sheet prior to infusion. [place], [place] 05/27/2020 11:04 AM

## 2020-10-04 ENCOUNTER — Observation Stay (HOSPITAL_COMMUNITY)
Admission: EM | Admit: 2020-10-04 | Discharge: 2020-10-06 | Disposition: A | Discharge status: Home / Self Care | Payer: Medicaid - Out of State | Attending: Family Medicine | Admitting: Family Medicine

## 2020-10-04 ENCOUNTER — Emergency Department (HOSPITAL_COMMUNITY): Payer: Medicaid - Out of State

## 2020-10-04 ENCOUNTER — Other Ambulatory Visit: Payer: Self-pay

## 2020-10-04 ENCOUNTER — Encounter (HOSPITAL_COMMUNITY): Payer: Self-pay

## 2020-10-04 DIAGNOSIS — E119 Type 2 diabetes mellitus without complications: Secondary | ICD-10-CM | POA: Diagnosis not present

## 2020-10-04 DIAGNOSIS — I82532 Chronic embolism and thrombosis of left popliteal vein: Secondary | ICD-10-CM | POA: Insufficient documentation

## 2020-10-04 DIAGNOSIS — I82509 Chronic embolism and thrombosis of unspecified deep veins of unspecified lower extremity: Secondary | ICD-10-CM | POA: Diagnosis present

## 2020-10-04 DIAGNOSIS — Z7901 Long term (current) use of anticoagulants: Secondary | ICD-10-CM | POA: Insufficient documentation

## 2020-10-04 DIAGNOSIS — G4733 Obstructive sleep apnea (adult) (pediatric): Secondary | ICD-10-CM | POA: Insufficient documentation

## 2020-10-04 DIAGNOSIS — E079 Disorder of thyroid, unspecified: Secondary | ICD-10-CM | POA: Insufficient documentation

## 2020-10-04 DIAGNOSIS — Z794 Long term (current) use of insulin: Secondary | ICD-10-CM | POA: Diagnosis not present

## 2020-10-04 DIAGNOSIS — F17210 Nicotine dependence, cigarettes, uncomplicated: Secondary | ICD-10-CM | POA: Insufficient documentation

## 2020-10-04 DIAGNOSIS — I2699 Other pulmonary embolism without acute cor pulmonale: Principal | ICD-10-CM | POA: Diagnosis present

## 2020-10-04 DIAGNOSIS — Z20822 Contact with and (suspected) exposure to covid-19: Secondary | ICD-10-CM | POA: Insufficient documentation

## 2020-10-04 DIAGNOSIS — R079 Chest pain, unspecified: Secondary | ICD-10-CM | POA: Diagnosis present

## 2020-10-04 DIAGNOSIS — E0800 Diabetes mellitus due to underlying condition with hyperosmolarity without nonketotic hyperglycemic-hyperosmolar coma (NKHHC): Secondary | ICD-10-CM

## 2020-10-04 LAB — BASIC METABOLIC PANEL
Anion gap: 7 (ref 5–15)
BUN: 15 mg/dL (ref 6–20)
CO2: 26 mmol/L (ref 22–32)
Calcium: 9.4 mg/dL (ref 8.9–10.3)
Chloride: 98 mmol/L (ref 98–111)
Creatinine, Ser: 1.02 mg/dL — ABNORMAL HIGH (ref 0.44–1.00)
GFR, Estimated: >60 mL/min (ref >60)
Glucose, Bld: 488 mg/dL — ABNORMAL HIGH (ref 70–99)
Potassium: 4.4 mmol/L (ref 3.5–5.1)
Sodium: 131 mmol/L — ABNORMAL LOW (ref 135–145)

## 2020-10-04 LAB — CBC WITH DIFFERENTIAL/PLATELET
Abs Immature Granulocytes: 0.02 10*3/uL (ref 0.00–0.07)
Basophils Absolute: 0 10*3/uL (ref 0.0–0.1)
Basophils Relative: 0 %
Eosinophils Absolute: 0.2 10*3/uL (ref 0.0–0.5)
Eosinophils Relative: 4 %
HCT: 31 % — ABNORMAL LOW (ref 36.0–46.0)
Hemoglobin: 9.4 g/dL — ABNORMAL LOW (ref 12.0–15.0)
Immature Granulocytes: 0 %
Lymphocytes Relative: 26 %
Lymphs Abs: 1.6 10*3/uL (ref 0.7–4.0)
MCH: 23.3 pg — ABNORMAL LOW (ref 26.0–34.0)
MCHC: 30.3 g/dL (ref 30.0–36.0)
MCV: 76.9 fL — ABNORMAL LOW (ref 80.0–100.0)
Monocytes Absolute: 0.4 10*3/uL (ref 0.1–1.0)
Monocytes Relative: 7 %
Neutro Abs: 3.8 10*3/uL (ref 1.7–7.7)
Neutrophils Relative %: 63 %
Platelets: 309 10*3/uL (ref 150–400)
RBC: 4.03 MIL/uL (ref 3.87–5.11)
RDW: 16.7 % — ABNORMAL HIGH (ref 11.5–15.5)
WBC: 6 10*3/uL (ref 4.0–10.5)
nRBC: 0 % (ref 0.0–0.2)

## 2020-10-04 LAB — TROPONIN I (HIGH SENSITIVITY)
Troponin I (High Sensitivity): 4 ng/L (ref <18)
Troponin I (High Sensitivity): 6 ng/L (ref <18)

## 2020-10-04 LAB — PROTIME-INR
INR: 1.4 — ABNORMAL HIGH (ref 0.8–1.2)
Prothrombin Time: 16.7 seconds — ABNORMAL HIGH (ref 11.4–15.2)

## 2020-10-04 MED ORDER — ACETAMINOPHEN 325 MG PO TABS
650.0000 mg | ORAL_TABLET | Freq: Once | ORAL | Status: DC
  Filled 2020-10-04: qty 2

## 2020-10-04 MED ORDER — HYDROCODONE-ACETAMINOPHEN 5-325 MG PO TABS
1.0000 | ORAL_TABLET | Freq: Once | ORAL | Status: AC
  Administered 2020-10-04: 1 via ORAL
  Filled 2020-10-04: qty 1

## 2020-10-04 MED ORDER — IOHEXOL 350 MG/ML SOLN
100.0000 mL | Freq: Once | INTRAVENOUS | Status: AC | PRN
  Administered 2020-10-04: 100 mL via INTRAVENOUS

## 2020-10-04 MED ORDER — WARFARIN SODIUM 7.5 MG PO TABS
7.5000 mg | ORAL_TABLET | Freq: Once | ORAL | Status: AC
  Administered 2020-10-04: 7.5 mg via ORAL
  Filled 2020-10-04: qty 1

## 2020-10-04 NOTE — ED Notes (Signed)
Pt in CT at this time.

## 2020-10-04 NOTE — ED Triage Notes (Signed)
Pt to er, pt states that she is here for chest pain, states that she has had the pain for the past week, states that she is also short of breath, pt states that she also has some swelling in her L leg, states that she came to the er tonight because she thought that she might have a blood clot.  States that she smokes occasionally and is on depo. States that she has had a pe before

## 2020-10-05 ENCOUNTER — Observation Stay (HOSPITAL_COMMUNITY): Payer: Medicaid - Out of State

## 2020-10-05 ENCOUNTER — Observation Stay (HOSPITAL_BASED_OUTPATIENT_CLINIC_OR_DEPARTMENT_OTHER): Payer: Medicaid - Out of State

## 2020-10-05 DIAGNOSIS — R079 Chest pain, unspecified: Secondary | ICD-10-CM | POA: Diagnosis not present

## 2020-10-05 DIAGNOSIS — I2699 Other pulmonary embolism without acute cor pulmonale: Secondary | ICD-10-CM | POA: Diagnosis not present

## 2020-10-05 DIAGNOSIS — R0602 Shortness of breath: Secondary | ICD-10-CM

## 2020-10-05 LAB — HEMOGLOBIN A1C
Hgb A1c MFr Bld: 9.1 % — ABNORMAL HIGH (ref 4.8–5.6)
Mean Plasma Glucose: 214.47 mg/dL

## 2020-10-05 LAB — COMPREHENSIVE METABOLIC PANEL
ALT: 21 U/L (ref 0–44)
AST: 18 U/L (ref 15–41)
Albumin: 3.4 g/dL — ABNORMAL LOW (ref 3.5–5.0)
Alkaline Phosphatase: 93 U/L (ref 38–126)
Anion gap: 8 (ref 5–15)
BUN: 14 mg/dL (ref 6–20)
CO2: 28 mmol/L (ref 22–32)
Calcium: 9 mg/dL (ref 8.9–10.3)
Chloride: 98 mmol/L (ref 98–111)
Creatinine, Ser: 0.99 mg/dL (ref 0.44–1.00)
GFR, Estimated: >60 mL/min (ref >60)
Glucose, Bld: 409 mg/dL — ABNORMAL HIGH (ref 70–99)
Potassium: 4.2 mmol/L (ref 3.5–5.1)
Sodium: 134 mmol/L — ABNORMAL LOW (ref 135–145)
Total Bilirubin: 0.1 mg/dL — ABNORMAL LOW (ref 0.3–1.2)
Total Protein: 6.9 g/dL (ref 6.5–8.1)

## 2020-10-05 LAB — CBC
HCT: 31.6 % — ABNORMAL LOW (ref 36.0–46.0)
Hemoglobin: 9.4 g/dL — ABNORMAL LOW (ref 12.0–15.0)
MCH: 22.9 pg — ABNORMAL LOW (ref 26.0–34.0)
MCHC: 29.7 g/dL — ABNORMAL LOW (ref 30.0–36.0)
MCV: 77.1 fL — ABNORMAL LOW (ref 80.0–100.0)
Platelets: 314 10*3/uL (ref 150–400)
RBC: 4.1 MIL/uL (ref 3.87–5.11)
RDW: 16.2 % — ABNORMAL HIGH (ref 11.5–15.5)
WBC: 5.7 10*3/uL (ref 4.0–10.5)
nRBC: 0 % (ref 0.0–0.2)

## 2020-10-05 LAB — GLUCOSE, CAPILLARY
Glucose-Capillary: 354 mg/dL — ABNORMAL HIGH (ref 70–99)
Glucose-Capillary: 334 mg/dL — ABNORMAL HIGH (ref 70–99)
Glucose-Capillary: 244 mg/dL — ABNORMAL HIGH (ref 70–99)
Glucose-Capillary: 275 mg/dL — ABNORMAL HIGH (ref 70–99)

## 2020-10-05 LAB — SARS CORONAVIRUS 2 BY RT PCR (HOSPITAL ORDER, PERFORMED IN ~~LOC~~ HOSPITAL LAB): SARS Coronavirus 2: NEGATIVE

## 2020-10-05 LAB — ECHOCARDIOGRAM COMPLETE
Area-P 1/2: 4.44 cm2
Height: 65 in
S' Lateral: 2.63 cm
Weight: 4620.84 oz

## 2020-10-05 LAB — VITAMIN D 25 HYDROXY (VIT D DEFICIENCY, FRACTURES): Vit D, 25-Hydroxy: 13.6 ng/mL — ABNORMAL LOW (ref 30–100)

## 2020-10-05 LAB — MAGNESIUM: Magnesium: 1.6 mg/dL — ABNORMAL LOW (ref 1.7–2.4)

## 2020-10-05 LAB — HIV ANTIBODY (ROUTINE TESTING W REFLEX): HIV Screen 4th Generation wRfx: NONREACTIVE

## 2020-10-05 LAB — PROTIME-INR
INR: 1.4 — ABNORMAL HIGH (ref 0.8–1.2)
Prothrombin Time: 16.2 seconds — ABNORMAL HIGH (ref 11.4–15.2)

## 2020-10-05 MED ORDER — PANTOPRAZOLE SODIUM 40 MG PO TBEC
40.0000 mg | DELAYED_RELEASE_TABLET | Freq: Every day | ORAL | Status: DC
  Administered 2020-10-05 – 2020-10-06 (×2): 40 mg via ORAL
  Filled 2020-10-05 (×2): qty 1

## 2020-10-05 MED ORDER — ACETAMINOPHEN 325 MG PO TABS: 650.0000 mg | ORAL_TABLET | Freq: Four times a day (QID) | ORAL | Status: DC | PRN

## 2020-10-05 MED ORDER — MAGNESIUM SULFATE 4 GM/100ML IV SOLN
4.0000 g | Freq: Once | INTRAVENOUS | Status: AC
  Administered 2020-10-05: 4 g via INTRAVENOUS
  Filled 2020-10-05: qty 100

## 2020-10-05 MED ORDER — IPRATROPIUM-ALBUTEROL 0.5-2.5 (3) MG/3ML IN SOLN
3.0000 mL | Freq: Two times a day (BID) | RESPIRATORY_TRACT | Status: DC
  Administered 2020-10-06: 3 mL via RESPIRATORY_TRACT
  Filled 2020-10-05: qty 3

## 2020-10-05 MED ORDER — ENOXAPARIN SODIUM 120 MG/0.8ML ~~LOC~~ SOLN
120.0000 mg | Freq: Two times a day (BID) | SUBCUTANEOUS | Status: DC
  Administered 2020-10-05: 120 mg via SUBCUTANEOUS
  Filled 2020-10-05: qty 0.8

## 2020-10-05 MED ORDER — ONDANSETRON HCL 4 MG PO TABS: 4.0000 mg | ORAL_TABLET | Freq: Four times a day (QID) | ORAL | Status: DC | PRN

## 2020-10-05 MED ORDER — NICOTINE 21 MG/24HR TD PT24
21.0000 mg | MEDICATED_PATCH | Freq: Every day | TRANSDERMAL | Status: DC
  Administered 2020-10-05 – 2020-10-06 (×2): 21 mg via TRANSDERMAL
  Filled 2020-10-05 (×2): qty 1

## 2020-10-05 MED ORDER — IPRATROPIUM-ALBUTEROL 0.5-2.5 (3) MG/3ML IN SOLN
3.0000 mL | Freq: Three times a day (TID) | RESPIRATORY_TRACT | Status: DC
  Administered 2020-10-05 (×2): 3 mL via RESPIRATORY_TRACT
  Filled 2020-10-05: qty 3

## 2020-10-05 MED ORDER — IPRATROPIUM-ALBUTEROL 0.5-2.5 (3) MG/3ML IN SOLN
3.0000 mL | Freq: Every day | RESPIRATORY_TRACT | Status: DC
  Filled 2020-10-05: qty 3

## 2020-10-05 MED ORDER — APIXABAN 5 MG PO TABS: 5.0000 mg | ORAL_TABLET | Freq: Two times a day (BID) | ORAL | Status: DC

## 2020-10-05 MED ORDER — WARFARIN SODIUM 5 MG PO TABS: 10.0000 mg | ORAL_TABLET | Freq: Every day | ORAL | Status: DC

## 2020-10-05 MED ORDER — INSULIN ASPART 100 UNIT/ML ~~LOC~~ SOLN
10.0000 [IU] | Freq: Three times a day (TID) | SUBCUTANEOUS | Status: DC
  Administered 2020-10-05 (×2): 10 [IU] via SUBCUTANEOUS

## 2020-10-05 MED ORDER — INSULIN ASPART 100 UNIT/ML ~~LOC~~ SOLN
0.0000 [IU] | Freq: Every day | SUBCUTANEOUS | Status: DC
  Administered 2020-10-05: 3 [IU] via SUBCUTANEOUS

## 2020-10-05 MED ORDER — ZOLPIDEM TARTRATE 5 MG PO TABS
5.0000 mg | ORAL_TABLET | Freq: Every evening | ORAL | Status: DC | PRN
  Administered 2020-10-05: 5 mg via ORAL
  Filled 2020-10-05: qty 1

## 2020-10-05 MED ORDER — ONDANSETRON HCL 4 MG/2ML IJ SOLN: 4.0000 mg | Freq: Four times a day (QID) | INTRAMUSCULAR | Status: DC | PRN

## 2020-10-05 MED ORDER — INSULIN GLARGINE 100 UNIT/ML ~~LOC~~ SOLN
20.0000 [IU] | Freq: Every day | SUBCUTANEOUS | Status: DC
  Administered 2020-10-05 – 2020-10-06 (×2): 20 [IU] via SUBCUTANEOUS
  Filled 2020-10-05 (×3): qty 0.2

## 2020-10-05 MED ORDER — WARFARIN - PHYSICIAN DOSING INPATIENT: Freq: Every day | Status: DC

## 2020-10-05 MED ORDER — ACETAMINOPHEN 650 MG RE SUPP: 650.0000 mg | Freq: Four times a day (QID) | RECTAL | Status: DC | PRN

## 2020-10-05 MED ORDER — KETOROLAC TROMETHAMINE 30 MG/ML IJ SOLN
30.0000 mg | Freq: Once | INTRAMUSCULAR | Status: AC
  Administered 2020-10-05: 30 mg via INTRAVENOUS
  Filled 2020-10-05: qty 1

## 2020-10-05 MED ORDER — ENOXAPARIN SODIUM 120 MG/0.8ML ~~LOC~~ SOLN
120.0000 mg | Freq: Once | SUBCUTANEOUS | Status: AC
  Administered 2020-10-05: 120 mg via SUBCUTANEOUS
  Filled 2020-10-05: qty 0.8

## 2020-10-05 MED ORDER — MELOXICAM 15 MG PO TABS: 15.0000 mg | ORAL_TABLET | Freq: Every day | ORAL | Status: DC

## 2020-10-05 MED ORDER — SODIUM CHLORIDE 0.9 % IV SOLN: INTRAVENOUS | Status: DC

## 2020-10-05 MED ORDER — APIXABAN 5 MG PO TABS
10.0000 mg | ORAL_TABLET | Freq: Two times a day (BID) | ORAL | Status: DC
  Administered 2020-10-05 – 2020-10-06 (×2): 10 mg via ORAL
  Filled 2020-10-05 (×2): qty 2

## 2020-10-05 MED ORDER — HYDROCODONE-ACETAMINOPHEN 5-325 MG PO TABS
1.0000 | ORAL_TABLET | ORAL | Status: DC | PRN
  Administered 2020-10-05 – 2020-10-06 (×3): 2 via ORAL
  Filled 2020-10-05 (×3): qty 2

## 2020-10-05 MED ORDER — KETOROLAC TROMETHAMINE 30 MG/ML IJ SOLN
30.0000 mg | Freq: Four times a day (QID) | INTRAMUSCULAR | Status: DC | PRN
  Administered 2020-10-05 – 2020-10-06 (×4): 30 mg via INTRAVENOUS
  Filled 2020-10-05 (×4): qty 1

## 2020-10-05 MED ORDER — INSULIN ASPART 100 UNIT/ML ~~LOC~~ SOLN
0.0000 [IU] | Freq: Three times a day (TID) | SUBCUTANEOUS | Status: DC
  Administered 2020-10-05: 15 [IU] via SUBCUTANEOUS
  Administered 2020-10-05: 20 [IU] via SUBCUTANEOUS
  Administered 2020-10-05: 7 [IU] via SUBCUTANEOUS
  Administered 2020-10-06 (×2): 4 [IU] via SUBCUTANEOUS

## 2020-10-05 MED ORDER — HYDROCODONE-ACETAMINOPHEN 5-325 MG PO TABS
1.0000 | ORAL_TABLET | Freq: Four times a day (QID) | ORAL | Status: DC | PRN
  Administered 2020-10-05: 1 via ORAL
  Filled 2020-10-05: qty 1

## 2020-10-05 NOTE — Progress Notes (Addendum)
ASSUMPTION OF CARE NOTE   10/05/2020 12:24 PM  Donna Douglas was seen and examined.  The H&P by the admitting provider, orders, imaging was reviewed.  Please see new orders.  Will continue to follow.   Impression/Plan  1. Acute bilateral PE - I had a long discussion with patient and she is agreeable to switching to apixaban for full anticoagulation and was reassured by case management that [place].  I have spoken with the pharmacist and will begin the transition.   2. OSA - CPAP ordered.  3. Tobacco - nicotine patch ordered.   Vitals:   10/05/20 0346 10/05/20 0737  BP: 119/67   Pulse: 92   Resp: 18   Temp:    SpO2: 90% 92%    Results for orders placed or performed during the hospital encounter of 10/04/20  SARS Coronavirus 2 by RT PCR (hospital order, performed in [facility] hospital lab) Nasopharyngeal Nasopharyngeal Swab   Specimen: Nasopharyngeal Swab  Result Value Ref Range   SARS Coronavirus 2 NEGATIVE NEGATIVE  Basic metabolic panel  Result Value Ref Range   Sodium 131 (L) 135 - 145 mmol/L   Potassium 4.4 3.5 - 5.1 mmol/L   Chloride 98 98 - 111 mmol/L   CO2 26 22 - 32 mmol/L   Glucose, Bld 488 (H) 70 - 99 mg/dL   BUN 15 6 - 20 mg/dL   Creatinine, Ser 3.81 (H) 0.44 - 1.00 mg/dL   Calcium 9.4 8.9 - 01.7 mg/dL   GFR, Estimated >51 >02 mL/min   Anion gap 7 5 - 15  CBC with Differential  Result Value Ref Range   WBC 6.0 4.0 - 10.5 K/uL   RBC 4.03 3.87 - 5.11 MIL/uL   Hemoglobin 9.4 (L) 12.0 - 15.0 g/dL   HCT 58.5 (L) 27.7 - 82.4 %   MCV 76.9 (L) 80.0 - 100.0 fL   MCH 23.3 (L) 26.0 - 34.0 pg   MCHC 30.3 30.0 - 36.0 g/dL   RDW 23.5 (H) 36.1 - 44.3 %   Platelets 309 150 - 400 K/uL   nRBC 0.0 0.0 - 0.2 %   Neutrophils Relative % 63 %   Neutro Abs 3.8 1.7 - 7.7 K/uL   Lymphocytes Relative 26 %   Lymphs Abs 1.6 0.7 - 4.0 K/uL   Monocytes Relative 7 %   Monocytes Absolute 0.4 0.1 - 1.0 K/uL   Eosinophils Relative 4 %   Eosinophils  Absolute 0.2 0.0 - 0.5 K/uL   Basophils Relative 0 %   Basophils Absolute 0.0 0.0 - 0.1 K/uL   Immature Granulocytes 0 %   Abs Immature Granulocytes 0.02 0.00 - 0.07 K/uL  Protime-INR  Result Value Ref Range   Prothrombin Time 16.7 (H) 11.4 - 15.2 seconds   INR 1.4 (H) 0.8 - 1.2  Magnesium  Result Value Ref Range   Magnesium 1.6 (L) 1.7 - 2.4 mg/dL  Comprehensive metabolic panel  Result Value Ref Range   Sodium 134 (L) 135 - 145 mmol/L   Potassium 4.2 3.5 - 5.1 mmol/L   Chloride 98 98 - 111 mmol/L   CO2 28 22 - 32 mmol/L   Glucose, Bld 409 (H) 70 - 99 mg/dL   BUN 14 6 - 20 mg/dL   Creatinine, Ser 1.54 0.44 - 1.00 mg/dL   Calcium 9.0 8.9 - 00.8 mg/dL   Total Protein 6.9 6.5 - 8.1 g/dL   Albumin 3.4 (L) 3.5 - 5.0 g/dL  AST 18 15 - 41 U/L   ALT 21 0 - 44 U/L   Alkaline Phosphatase 93 38 - 126 U/L   Total Bilirubin 0.1 (L) 0.3 - 1.2 mg/dL   GFR, Estimated >85 >88 mL/min   Anion gap 8 5 - 15  CBC  Result Value Ref Range   WBC 5.7 4.0 - 10.5 K/uL   RBC 4.10 3.87 - 5.11 MIL/uL   Hemoglobin 9.4 (L) 12.0 - 15.0 g/dL   HCT 50.2 (L) 77.4 - 12.8 %   MCV 77.1 (L) 80.0 - 100.0 fL   MCH 22.9 (L) 26.0 - 34.0 pg   MCHC 29.7 (L) 30.0 - 36.0 g/dL   RDW 78.6 (H) 76.7 - 20.9 %   Platelets 314 150 - 400 K/uL   nRBC 0.0 0.0 - 0.2 %  Protime-INR  Result Value Ref Range   Prothrombin Time 16.2 (H) 11.4 - 15.2 seconds   INR 1.4 (H) 0.8 - 1.2  Glucose, capillary  Result Value Ref Range   Glucose-Capillary 354 (H) 70 - 99 mg/dL  Glucose, capillary  Result Value Ref Range   Glucose-Capillary 334 (H) 70 - 99 mg/dL  ECHOCARDIOGRAM COMPLETE  Result Value Ref Range   Weight 4,620.84 oz   Height 65 in   BP 119/67 mmHg  Troponin I (High Sensitivity)  Result Value Ref Range   Troponin I (High Sensitivity) 4 <18 ng/L  Troponin I (High Sensitivity)  Result Value Ref Range   Troponin I (High Sensitivity) 6 <18 ng/L   Time Spent: 35 mins   Maryln Manuel, MD [facility]   10/04/2020   7:05 PM How to contact the [facility] Attending or Consulting provider 7A - 7P or covering provider during after hours 7P -7A, for this patient?  1. Check the care team in [facility] and look for a) attending/consulting [facility] provider listed and b) the [facility] team listed 2. Log into [email or web address] and use [place] universal password to access. If you do not have the password, please contact the hospital operator. 3. Locate the [facility] provider you are looking for under [facility] and page to a number that you can be directly reached. 4. If you still have difficulty reaching the provider, please page the [facility] (Director on Call) for the Hospitalists listed on amion for assistance.

## 2020-10-05 NOTE — Progress Notes (Signed)
*  PRELIMINARY RESULTS* Echocardiogram 2D Echocardiogram has been performed.  Jeryl Columbia 10/05/2020, 10:38 AM

## 2020-10-05 NOTE — Progress Notes (Signed)
ANTICOAGULATION CONSULT NOTE - Initial Consult  Pharmacy Consult for eliquis Indication: pulmonary embolus  No Known Allergies  Patient Measurements: Height: 5\' 5"  (165.1 cm) Weight: 131 kg (288 lb 12.8 oz) IBW/kg (Calculated) : 57  Vital Signs: BP: 119/67 (02/05 0346) Pulse Rate: 92 (02/05 0346)  Labs: Recent Labs    10/04/20 1923 10/04/20 1927 10/04/20 2136 10/05/20 0626  HGB  --  9.4*  --  9.4*  HCT  --  31.0*  --  31.6*  PLT  --  309  --  314  LABPROT 16.7*  --   --  16.2*  INR 1.4*  --   --  1.4*  CREATININE  --  1.02*  --  0.99  TROPONINIHS  --  4 6  --     Estimated Creatinine Clearance: 104.3 mL/min (by C-G formula based on SCr of 0.99 mg/dL).   Medical History: Past Medical History:  Diagnosis Date  . Asthma   . Diabetes mellitus without complication (HCC)   . DVT (deep venous thrombosis) (HCC)   . Pulmonary emboli (HCC)   . Sleep apnea   . Thyroid disease     Medications:  Medications Prior to Admission  Medication Sig Dispense Refill Last Dose  . albuterol (VENTOLIN HFA) 108 (90 Base) MCG/ACT inhaler Inhale 2 puffs into the lungs every 6 (six) hours as needed for wheezing or shortness of breath. 1 Inhaler 0 10/04/2020 at Unknown time  . ipratropium-albuterol (DUONEB) 0.5-2.5 (3) MG/3ML SOLN Take 3 mLs by nebulization daily.   Past Week at Unknown time  . Meclizine HCl 25 MG CHEW Chew 1 tablet by mouth daily as needed for dizziness.   10/03/2020 at Unknown time  . warfarin (COUMADIN) 5 MG tablet Take 10 mg by mouth daily.   10/04/2020 at 1800  . ELIQUIS 5 MG TABS tablet Take by mouth.     12/02/2020 HYDROcodone-acetaminophen (NORCO/VICODIN) 5-325 MG tablet      . ibuprofen (ADVIL) 800 MG tablet Take 1 tablet (800 mg total) by mouth every 8 (eight) hours as needed. (Patient not taking: No sig reported) 30 tablet 0 Not Taking at Unknown time  . meloxicam (MOBIC) 15 MG tablet Mobic 15 mg tablet  Take 1 tablet every day by oral route. (Patient not taking: No sig  reported)   Not Taking at Unknown time    Assessment: 40 y.o. 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 Patient reports that it started 1 week ago, and has been progressively worse. She is on depo, smokes 1.5ppd. The patient was on coumadin then switched to eliquis, which she had not started yet. INR 1.4 on admission, subtherapeutic. Patient has not been compliant with regimen. MD discussed treatment options with patient and she is agreeable to take eliquis for treatment. He also discussed smoking cessation and stopping depoprovera that can provoke clots. Will reinforce and discuss risks of meloxicam, ibuprofen, NSAIDS with anticoagulants  Goal of Therapy:  Monitor platelets by anticoagulation protocol: Yes   Plan:  Eliquis 10mg  po bid x 7 days, then 5mg  po bid Monitor for S/S of bleeding Educate on eliquis  24, BS , BCPS Clinical Pharmacist Pager [phone] 10/05/2020,9:32 AM

## 2020-10-05 NOTE — ED Provider Notes (Signed)
[facility] EMERGENCY DEPARTMENT Provider Note   CSN: [id number] Arrival date & time: 10/04/20  1846     History Chief Complaint  Patient presents with  . Chest Pain    Donna Douglas is a 40 y.o. female.  Patient presents with chest pain and shortness of breath for 2-3 days.  She states its an ache in the mid chest, states it feels similar to prior PE.  She states she has not checked her INR level in a few days.  She was due for evaluation with her cardiologist in a few days as well.  Continues to smoke intermittently and has had a Depo-Provera shot.        Past Medical History:  Diagnosis Date  . Asthma   . Diabetes mellitus without complication (HCC)   . DVT (deep venous thrombosis) (HCC)   . Pulmonary emboli (HCC)   . Sleep apnea   . Thyroid disease     Patient Active Problem List   Diagnosis Date Noted  . S/P right oophorectomy 03/01/2018  . Pelvic pain in female   . Torsion of right ovary and ovarian pedicle   . Hemoperitoneum, nontraumatic   . [redacted] weeks gestation of pregnancy   . Pleurisy   . Chest pain 04/16/2017  . Intrauterine pregnancy 04/16/2017  . 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 DM (diabetes mellitus) (HCC) 03/23/2016  . Thyroid condition 03/23/2016  . Pulmonary emboli (HCC) 01/05/2016  . Bilateral pulmonary embolism (HCC) 01/05/2016    Past Surgical History:  Procedure Laterality Date  . anxiety    . IVC FILTER PLACEMENT ([facility] HX)    . SALPINGOOPHORECTOMY Right 03/01/2018   Procedure: LAPARATOMY WITH SALPINGO OOPHORECTOMY RIGHT SIDE;  Surgeon: Lazaro Arms, MD;  Location: [facility];  Service: Gynecology;  Laterality: Right;     OB History    Gravida  8   Para  8   Term      Preterm      AB      Living  8     SAB      IAB      Ectopic      Multiple      Live Births              Family History   Problem Relation Age of Onset  . Asthma Father   . Diabetes Mother   . Hypertension Mother     Social History   Tobacco Use  . Smoking status: Current Every Day Smoker    Packs/day: 0.50    Types: Cigarettes  . Smokeless tobacco: Never Used  Vaping Use  . Vaping Use: Never used  Substance Use Topics  . Alcohol use: No  . Drug use: Not Currently    Types: Marijuana    Comment: denies use 04/06/18    Home Medications Prior to Admission medications   Medication Sig Start Date End Date Taking? Authorizing Provider  albuterol (VENTOLIN HFA) 108 (90 Base) MCG/ACT inhaler Inhale 2 puffs into the lungs every 6 (six) hours as needed for wheezing or shortness of breath. 04/17/17  Yes Tat, Onalee Hua, MD  ipratropium-albuterol (DUONEB) 0.5-2.5 (3) MG/3ML SOLN Take 3 mLs by nebulization daily. 09/11/20  Yes [provider]  Meclizine HCl 25 MG CHEW Chew 1 tablet by mouth daily as needed for dizziness. 10/08/19  Yes [provider]  warfarin (  COUMADIN) 5 MG tablet Take 10 mg by mouth daily. 11/13/19  Yes [provider]  ELIQUIS 5 MG TABS tablet Take by mouth. 10/04/20   [provider]  HYDROcodone-acetaminophen (NORCO/VICODIN) 5-325 MG tablet  10/04/20   [provider]  ibuprofen (ADVIL) 800 MG tablet Take 1 tablet (800 mg total) by mouth every 8 (eight) hours as needed. Patient not taking: No sig reported 11/14/19   Bethel Born, PA-C  meloxicam (MOBIC) 15 MG tablet Mobic 15 mg tablet  Take 1 tablet every day by oral route. Patient not taking: No sig reported    [provider]    Allergies    Patient has no known allergies.  Review of Systems   Review of Systems  Constitutional: Negative for fever.  HENT: Negative for ear pain.   Eyes: Negative for pain.  Respiratory: Positive for shortness of breath. Negative for cough.   Cardiovascular: Positive for chest pain.  Gastrointestinal: Negative for abdominal pain.  Genitourinary:  Negative for flank pain.  Musculoskeletal: Negative for back pain.  Skin: Negative for rash.  Neurological: Negative for headaches.    Physical Exam Updated Vital Signs BP (!) 108/91   Pulse 75   Temp 98 F (36.7 C) (Oral)   Resp 16   Ht 5\' 5"  (1.651 m)   Wt 124.7 kg   SpO2 95%   BMI 45.76 kg/m   Physical Exam Constitutional:      General: She is not in acute distress.    Appearance: Normal appearance.  HENT:     Head: Normocephalic.     Nose: Nose normal.  Eyes:     Extraocular Movements: Extraocular movements intact.  Cardiovascular:     Rate and Rhythm: Normal rate.  Pulmonary:     Effort: Pulmonary effort is normal.  Musculoskeletal:        General: Normal range of motion.     Cervical back: Normal range of motion.  Neurological:     General: No focal deficit present.     Mental Status: She is alert. Mental status is at baseline.     ED Results / Procedures / Treatments   Labs (all labs ordered are listed, but only abnormal results are displayed) Labs Reviewed  BASIC METABOLIC PANEL - Abnormal; Notable for the following components:      Result Value   Sodium 131 (*)    Glucose, Bld 488 (*)    Creatinine, Ser 1.02 (*)    All other components within normal limits  CBC WITH DIFFERENTIAL/PLATELET - Abnormal; Notable for the following components:   Hemoglobin 9.4 (*)    HCT 31.0 (*)    MCV 76.9 (*)    MCH 23.3 (*)    RDW 16.7 (*)    All other components within normal limits  PROTIME-INR - Abnormal; Notable for the following components:   Prothrombin Time 16.7 (*)    INR 1.4 (*)    All other components within normal limits  SARS CORONAVIRUS 2 BY RT PCR [facility] ORDER, PERFORMED IN [facility] LAB)  TROPONIN I (HIGH SENSITIVITY)  TROPONIN I (HIGH SENSITIVITY)    EKG EKG Interpretation  Date/Time:  Friday October 04 2020 19:17:47 EST Ventricular Rate:  83 PR Interval:    QRS Duration: 78 QT Interval:  338 QTC Calculation: 398 R  Axis:   50 Text Interpretation: Sinus rhythm Abnormal R-wave progression, early transition Confirmed by 03-11-1985 ([id number]) on 10/04/2020 7:24:32 PM   Radiology CT Angio Chest  PE W and/or Wo Contrast  Result Date: 10/04/2020 CLINICAL DATA:  Left side chest pain EXAM: CT ANGIOGRAPHY CHEST WITH CONTRAST TECHNIQUE: Multidetector CT imaging of the chest was performed using the standard protocol during bolus administration of intravenous contrast. Multiplanar CT image reconstructions and MIPs were obtained to evaluate the vascular anatomy. CONTRAST:  OMNIPAQUE IOHEXOL 350 MG/ML SOLN COMPARISON:  05/26/2020 FINDINGS: Cardiovascular: There are bilateral pulmonary emboli seen in both upper and lower lobes. No evidence of right heart strain. Aorta normal caliber. Heart is normal size. Mediastinum/Nodes: No mediastinal, hilar, or axillary adenopathy. Trachea and esophagus are unremarkable. Thyroid unremarkable. Lungs/Pleura: Minimal right base atelectasis.  No effusions. Upper Abdomen: Imaging into the upper abdomen demonstrates no acute findings. Musculoskeletal: Chest wall soft tissues are unremarkable. No acute bony abnormality. Review of the MIP images confirms the above findings. IMPRESSION: Bilateral pulmonary emboli.  No evidence of right heart strain. Critical Value/emergent results were called by telephone at the time of interpretation on 10/04/2020 at 11:56 pm to provider [facility] , who verbally acknowledged these results. Electronically Signed   By: Charlett Nose M.D.   On: 10/04/2020 23:59    Procedures .Critical Care Performed by: Cheryll Cockayne, MD Authorized by: Cheryll Cockayne, MD   Critical care provider statement:    Critical care time (minutes):  30   Critical care time was exclusive of:  Separately billable procedures and treating other patients and teaching time Comments:     Bilateral pulmonary embolisms requiring Lovenox.     Medications Ordered in ED Medications  enoxaparin  (LOVENOX) injection 120 mg (has no administration in time range)  HYDROcodone-acetaminophen (NORCO/VICODIN) 5-325 MG per tablet 1 tablet (1 tablet Oral Given 10/04/20 2228)  warfarin (COUMADIN) tablet 7.5 mg (7.5 mg Oral Given 10/04/20 2247)  iohexol (OMNIPAQUE) 350 MG/ML injection 100 mL (100 mLs Intravenous Contrast Given 10/04/20 2323)    ED Course  I have reviewed the triage vital signs and the nursing notes.  Pertinent labs & imaging results that were available during my care of the patient were reviewed by me and considered in my medical decision making (see chart for details).    MDM Rules/Calculators/A&P                          Bones unremarkable EKG shows sinus rhythm no ST elevations depressions.  Pulmonary embolism concerning for bilateral PEs.  No evidence of right heart strain per radiologist.  Patient's INR level subtherapeutic, given oral Coumadin here as well as subcu Lovenox.  Case discussed with the hospitalist who will admit the patient.   Final Clinical Impression(s) / ED Diagnoses Final diagnoses:  Bilateral pulmonary embolism [facility])    Rx / DC Orders ED Discharge Orders    None       Cheryll Cockayne, MD 10/05/20 [phone]

## 2020-10-05 NOTE — Progress Notes (Signed)
CPAP of 5 , full face mask has been set up in room. Patient tried on removed stating she had to get use to it. States she will try again. That it had been years since she wore the machine.

## 2020-10-05 NOTE — H&P (Signed)
TRH H&P    Patient Demographics:    Donna Douglas, is a 40 y.o. female  MRN: [id number]  DOB - 10/01/1980  Admit Date - 10/04/2020  Referring MD/NP/PA: Audley HoseHong  Outpatient Primary MD for the patient is Patient, No Pcp Per  Patient coming from: [facility]  Chief complaint- chest pain   HPI:    Donna Ihaiffany Bloxom  is a 40 y.o. 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 Patient reports that it started 1 week ago, and has been progressively worse. The pain is constant. It is 10/10. It feels like a sharp pressure. She has associated dyspnea and dry cough. She reports palpitations as well. Patient reports that this feels very similar to her previous PE. She is on depo, smokes 1.5ppd, and has had Pes in the past. Patient reports that the norco given in the ED doesn't touch her pain. Patient reports that since this has started she has had no appetite and has been having to "force" herself to eat. Patient has no other complaints  She does not drink EtOH, she does not use illicit drugs. She is not vacc'd for covid. She is Full Code.   In the ED T98, HR 75, R 16-24, BP 108/91 95% WBC 6.0. plt 309 Glucose 488 Gap 7 Trop 4, 6 CTA = BL Pulm emboli. No evidence of right heart strain EKG HR 88, SR, QTc 398 Norco, lovenox, and warfarin given in the ED INR 1.4 (despite having been on warfarin until switch to Eliquis)    Review of systems:    In addition to the HPI above,  No Fever-chills, No Headache, No changes with Vision or hearing, No problems swallowing food or Liquids, No Abdominal pain, No Nausea or Vomiting, bowel movements are regular, No Blood in stool or Urine, No dysuria, No new skin rashes or bruises, No new joints pains-aches,  No new weakness, tingling, numbness in any extremity, No recent weight gain or  loss, No polyuria, polydypsia or polyphagia, No significant Mental Stressors.  All other systems reviewed and are negative.    Past History of the following :    Past Medical History:  Diagnosis Date  . Asthma   . Diabetes mellitus without complication (HCC)   . DVT (deep venous thrombosis) (HCC)   . Pulmonary emboli (HCC)   . Sleep apnea   . Thyroid disease       Past Surgical History:  Procedure Laterality Date  . anxiety    . IVC FILTER PLACEMENT ([facility] HX)    . SALPINGOOPHORECTOMY Right 03/01/2018   Procedure: LAPARATOMY WITH SALPINGO OOPHORECTOMY RIGHT SIDE;  Surgeon: Lazaro ArmsEure, Luther H, MD;  Location: [facility];  Service: Gynecology;  Laterality: Right;      Social History:      Social History   Tobacco Use  . Smoking status: Current Every Day Smoker    Packs/day: 0.50    Types: Cigarettes  . Smokeless tobacco: Never Used  Substance Use  Topics  . Alcohol use: No       Family History :     Family History  Problem Relation Age of Onset  . Asthma Father   . Diabetes Mother   . Hypertension Mother      Home Medications:   Prior to Admission medications   Medication Sig Start Date End Date Taking? Authorizing Provider  albuterol (VENTOLIN HFA) 108 (90 Base) MCG/ACT inhaler Inhale 2 puffs into the lungs every 6 (six) hours as needed for wheezing or shortness of breath. 04/17/17  Yes Tat, Onalee Hua, MD  ipratropium-albuterol (DUONEB) 0.5-2.5 (3) MG/3ML SOLN Take 3 mLs by nebulization daily. 09/11/20  Yes [provider]  Meclizine HCl 25 MG CHEW Chew 1 tablet by mouth daily as needed for dizziness. 10/08/19  Yes [provider]  warfarin (COUMADIN) 5 MG tablet Take 10 mg by mouth daily. 11/13/19  Yes [provider]  ELIQUIS 5 MG TABS tablet Take by mouth. 10/04/20   [provider]  HYDROcodone-acetaminophen (NORCO/VICODIN) 5-325 MG tablet  10/04/20   [provider]  ibuprofen (ADVIL) 800 MG tablet Take 1 tablet (800 mg total) by  mouth every 8 (eight) hours as needed. Patient not taking: No sig reported 11/14/19   Bethel Born, PA-C  meloxicam (MOBIC) 15 MG tablet Mobic 15 mg tablet  Take 1 tablet every day by oral route. Patient not taking: No sig reported    [provider]     Allergies:    No Known Allergies   Physical Exam:   Vitals  Blood pressure 107/69, pulse 96, temperature 98 F (36.7 C), temperature source Oral, resp. rate 19, height 5\' 5"  (1.651 m), weight 124.7 kg, SpO2 95 %.  1.  General: Supine in bed with head of bed elevated  2. Psychiatric: AO x3, pain med seeking behavior, cooperative with exam  3. Neurologic: CN II-XII intact, moves all 4 extremities voluntary, no focal deficits on limited exam  4. HEENMT:  Head is atraumatic normocephalic, pupils reactive, neck is supple, trachea is midline  5. Respiratory : LCTABL, No wheezes, rhonchi, or crackles, no cyanosis  6. Cardiovascular : HR tachycardic, rhythm is regular, no murmurs rubs or gallops Unilateral lower extremity swelling Left  7. Gastrointestinal:  Abdomen is obese, soft, non distended, non tender to palpation  8. Skin:  Skin is warm, dry, and intact, no acute lesions on limited exam  9.Musculoskeletal:  Chest tenderness to palpation, unilateral left lower extremity swelling.     Data Review:    CBC Recent Labs  Lab 10/04/20 1927  WBC 6.0  HGB 9.4*  HCT 31.0*  PLT 309  MCV 76.9*  MCH 23.3*  MCHC 30.3  RDW 16.7*  LYMPHSABS 1.6  MONOABS 0.4  EOSABS 0.2  BASOSABS 0.0   ------------------------------------------------------------------------------------------------------------------  Results for orders placed or performed during the hospital encounter of 10/04/20 (from the past 48 hour(s))  Protime-INR     Status: Abnormal   Collection Time: 10/04/20  7:23 PM  Result Value Ref Range   Prothrombin Time 16.7 (H) 11.4 - 15.2 seconds   INR 1.4 (H) 0.8 - 1.2    Comment: (NOTE) INR goal  varies based on device and disease states. Performed at [facility], [address] [place], [place]   Basic metabolic panel     Status: Abnormal   Collection Time: 10/04/20  7:27 PM  Result Value Ref Range   Sodium 131 (L) 135 - 145 mmol/L   Potassium  4.4 3.5 - 5.1 mmol/L   Chloride 98 98 - 111 mmol/L   CO2 26 22 - 32 mmol/L   Glucose, Bld 488 (H) 70 - 99 mg/dL    Comment: Glucose reference range applies only to samples taken after fasting for at least 8 hours.   BUN 15 6 - 20 mg/dL   Creatinine, Ser 9.37 (H) 0.44 - 1.00 mg/dL   Calcium 9.4 8.9 - 34.2 mg/dL   GFR, Estimated >87 >68 mL/min    Comment: (NOTE) Calculated using the CKD-EPI Creatinine Equation (2021)    Anion gap 7 5 - 15    Comment: Performed at [facility] [address], [place], [place] [address]  CBC with Differential     Status: Abnormal   Collection Time: 10/04/20  7:27 PM  Result Value Ref Range   WBC 6.0 4.0 - 10.5 K/uL   RBC 4.03 3.87 - 5.11 MIL/uL   Hemoglobin 9.4 (L) 12.0 - 15.0 g/dL   HCT 62.0 (L) 35.5 - 97.4 %   MCV 76.9 (L) 80.0 - 100.0 fL   MCH 23.3 (L) 26.0 - 34.0 pg   MCHC 30.3 30.0 - 36.0 g/dL   RDW 16.3 (H) 84.5 - 36.4 %   Platelets 309 150 - 400 K/uL   nRBC 0.0 0.0 - 0.2 %   Neutrophils Relative % 63 %   Neutro Abs 3.8 1.7 - 7.7 K/uL   Lymphocytes Relative 26 %   Lymphs Abs 1.6 0.7 - 4.0 K/uL   Monocytes Relative 7 %   Monocytes Absolute 0.4 0.1 - 1.0 K/uL   Eosinophils Relative 4 %   Eosinophils Absolute 0.2 0.0 - 0.5 K/uL   Basophils Relative 0 %   Basophils Absolute 0.0 0.0 - 0.1 K/uL   Immature Granulocytes 0 %   Abs Immature Granulocytes 0.02 0.00 - 0.07 K/uL    Comment: Performed at [facility], [address], [place], [place] [address]  Troponin I (High Sensitivity)     Status: None   Collection Time: 10/04/20  7:27 PM  Result Value Ref Range   Troponin I (High Sensitivity) 4 <18 ng/L    Comment: (NOTE) Elevated high sensitivity troponin I (hsTnI) values and  significant  changes across serial measurements may suggest ACS but many other  chronic and acute conditions are known to elevate hsTnI results.  Refer to the "Links" section for chest pain algorithms and additional  guidance. Performed at [facility], [address], [place], [place] [address]   Troponin I (High Sensitivity)     Status: None   Collection Time: 10/04/20  9:36 PM  Result Value Ref Range   Troponin I (High Sensitivity) 6 <18 ng/L    Comment: (NOTE) Elevated high sensitivity troponin I (hsTnI) values and significant  changes across serial measurements may suggest ACS but many other  chronic and acute conditions are known to elevate hsTnI results.  Refer to the "Links" section for chest pain algorithms and additional  guidance. Performed at [facility], [address] [place], [place] [address]   SARS Coronavirus 2 by RT PCR (hospital order, performed in [facility] hospital lab) Nasopharyngeal Nasopharyngeal Swab     Status: None   Collection Time: 10/05/20 12:35 AM   Specimen: Nasopharyngeal Swab  Result Value Ref Range   SARS Coronavirus 2 NEGATIVE NEGATIVE    Comment: (NOTE) SARS-CoV-2 target nucleic acids are NOT DETECTED.  The SARS-CoV-2 RNA is generally detectable in upper and lower respiratory specimens during the  acute phase of infection. The lowest concentration of SARS-CoV-2 viral copies this assay can detect is 250 copies / mL. A negative result does not preclude SARS-CoV-2 infection and should not be used as the sole basis for treatment or other patient management decisions.  A negative result may occur with improper specimen collection / handling, submission of specimen other than nasopharyngeal swab, presence of viral mutation(s) within the areas targeted by this assay, and inadequate number of viral copies (<250 copies / mL). A negative result must be combined with clinical observations, patient history, and epidemiological information.  Fact Sheet  for Patients:   BoilerBrush.com.cy  Fact Sheet for Healthcare Providers: https://pope.com/  This test is not yet approved or  cleared by the [place] FDA and has been authorized for detection and/or diagnosis of SARS-CoV-2 by FDA under an Emergency Use Authorization (EUA).  This EUA will remain in effect (meaning this test can be used) for the duration of the COVID-19 declaration under Section 564(b)(1) of the Act, 21 U.S.C. section 360bbb-3(b)(1), unless the authorization is terminated or revoked sooner.  Performed at [facility], [address] [place], [place] [address]     Chemistries  Recent Labs  Lab 10/04/20 1927  NA 131*  K 4.4  CL 98  CO2 26  GLUCOSE 488*  BUN 15  CREATININE 1.02*  CALCIUM 9.4   ------------------------------------------------------------------------------------------------------------------  ------------------------------------------------------------------------------------------------------------------ GFR: Estimated Creatinine Clearance: 98.3 mL/min (A) (by C-G formula based on SCr of 1.02 mg/dL (H)). Liver Function Tests: No results for input(s): AST, ALT, ALKPHOS, BILITOT, PROT, ALBUMIN in the last 168 hours. No results for input(s): LIPASE, AMYLASE in the last 168 hours. No results for input(s): AMMONIA in the last 168 hours. Coagulation Profile: Recent Labs  Lab 10/04/20 1923  INR 1.4*   Cardiac Enzymes: No results for input(s): CKTOTAL, CKMB, CKMBINDEX, TROPONINI in the last 168 hours. BNP (last 3 results) No results for input(s): PROBNP in the last 8760 hours. HbA1C: No results for input(s): HGBA1C in the last 72 hours. CBG: No results for input(s): GLUCAP in the last 168 hours. Lipid Profile: No results for input(s): CHOL, HDL, LDLCALC, TRIG, CHOLHDL, LDLDIRECT in the last 72 hours. Thyroid Function Tests: No results for input(s): TSH, T4TOTAL, FREET4, T3FREE, THYROIDAB  in the last 72 hours. Anemia Panel: No results for input(s): VITAMINB12, FOLATE, FERRITIN, TIBC, IRON, RETICCTPCT in the last 72 hours.  --------------------------------------------------------------------------------------------------------------- Urine analysis:    Component Value Date/Time   COLORURINE YELLOW 03/01/2018 1133   APPEARANCEUR CLEAR 03/01/2018 1133   LABSPEC >1.030 (H) 03/01/2018 1133   PHURINE 6.0 03/01/2018 1133   GLUCOSEU NEGATIVE 03/01/2018 1133   HGBUR NEGATIVE 03/01/2018 1133   BILIRUBINUR NEGATIVE 03/01/2018 1133   KETONESUR NEGATIVE 03/01/2018 1133   PROTEINUR NEGATIVE 03/01/2018 1133   NITRITE NEGATIVE 03/01/2018 1133   LEUKOCYTESUR NEGATIVE 03/01/2018 1133      Imaging Results:    CT Angio Chest PE W and/or Wo Contrast  Result Date: 10/04/2020 CLINICAL DATA:  Left side chest pain EXAM: CT ANGIOGRAPHY CHEST WITH CONTRAST TECHNIQUE: Multidetector CT imaging of the chest was performed using the standard protocol during bolus administration of intravenous contrast. Multiplanar CT image reconstructions and MIPs were obtained to evaluate the vascular anatomy. CONTRAST:  OMNIPAQUE IOHEXOL 350 MG/ML SOLN COMPARISON:  05/26/2020 FINDINGS: Cardiovascular: There are bilateral pulmonary emboli seen in both upper and lower lobes. No evidence of right heart strain. Aorta normal caliber. Heart is normal size. Mediastinum/Nodes: No mediastinal, hilar, or axillary adenopathy. Trachea  and esophagus are unremarkable. Thyroid unremarkable. Lungs/Pleura: Minimal right base atelectasis.  No effusions. Upper Abdomen: Imaging into the upper abdomen demonstrates no acute findings. Musculoskeletal: Chest wall soft tissues are unremarkable. No acute bony abnormality. Review of the MIP images confirms the above findings. IMPRESSION: Bilateral pulmonary emboli.  No evidence of right heart strain. Critical Value/emergent results were called by telephone at the time of interpretation on  10/04/2020 at 11:56 pm to provider [facility] , who verbally acknowledged these results. Electronically Signed   By: Charlett Nose M.D.   On: 10/04/2020 23:59    My personal review of EKG: Rhythm NSR, Rate 83 /min, QTc 398 ,no Acute ST changes   Assessment & Plan:    Active Problems:   Pulmonary embolism (HCC)   1. Pulm embolism  1. Reported history of protein S and protein C deficiency 2. On Depo, Smoker, hx of PE 3. Has not yet started Eliquis that was recently prescribed 4. CT = BL Pulm emboli, no evidence of heart strain 5. Continue therapeutic lovenox, warfarin, and trend INR 6. Echo and BL US DVT of BL lower extremities 7. Counseled on the importance of d/c'ng smoking and depo 8. Advised IUD - patient reports that she would not be able to tolerate 2. OSA 1. Continue CPAP 3. Tobacco abuse 1. Patient advised to stop smoking  2. She reports that she is ready to stop smoking and she reports she is ready, and trying to quit   DVT Prophylaxis-   Lovenox - SCDs   AM Labs Ordered, also please review Full Orders  Family Communication: Admission, patients condition and plan of care including tests being ordered have been discussed with the patient and boyfriend who indicate understanding and agree with the plan and Code Status.  Code Status:  Full  Admission status: Observation  Time spent in minutes : 64   Zo Loudon B Zierle-Ghosh DO

## 2020-10-06 ENCOUNTER — Other Ambulatory Visit: Payer: Self-pay | Admitting: Family Medicine

## 2020-10-06 ENCOUNTER — Telehealth: Payer: Self-pay | Admitting: Family Medicine

## 2020-10-06 DIAGNOSIS — I2699 Other pulmonary embolism without acute cor pulmonale: Secondary | ICD-10-CM | POA: Diagnosis not present

## 2020-10-06 DIAGNOSIS — I82532 Chronic embolism and thrombosis of left popliteal vein: Secondary | ICD-10-CM

## 2020-10-06 DIAGNOSIS — I82509 Chronic embolism and thrombosis of unspecified deep veins of unspecified lower extremity: Secondary | ICD-10-CM | POA: Diagnosis present

## 2020-10-06 LAB — BASIC METABOLIC PANEL
Anion gap: 5 (ref 5–15)
BUN: 15 mg/dL (ref 6–20)
CO2: 26 mmol/L (ref 22–32)
Calcium: 8.2 mg/dL — ABNORMAL LOW (ref 8.9–10.3)
Chloride: 106 mmol/L (ref 98–111)
Creatinine, Ser: 0.84 mg/dL (ref 0.44–1.00)
GFR, Estimated: >60 mL/min (ref >60)
Glucose, Bld: 193 mg/dL — ABNORMAL HIGH (ref 70–99)
Potassium: 4 mmol/L (ref 3.5–5.1)
Sodium: 137 mmol/L (ref 135–145)

## 2020-10-06 LAB — CBC
HCT: 28.3 % — ABNORMAL LOW (ref 36.0–46.0)
Hemoglobin: 8.6 g/dL — ABNORMAL LOW (ref 12.0–15.0)
MCH: 23.7 pg — ABNORMAL LOW (ref 26.0–34.0)
MCHC: 30.4 g/dL (ref 30.0–36.0)
MCV: 78 fL — ABNORMAL LOW (ref 80.0–100.0)
Platelets: 298 10*3/uL (ref 150–400)
RBC: 3.63 MIL/uL — ABNORMAL LOW (ref 3.87–5.11)
RDW: 16.7 % — ABNORMAL HIGH (ref 11.5–15.5)
WBC: 6.1 10*3/uL (ref 4.0–10.5)
nRBC: 0 % (ref 0.0–0.2)

## 2020-10-06 LAB — GLUCOSE, CAPILLARY
Glucose-Capillary: 221 mg/dL — ABNORMAL HIGH (ref 70–99)
Glucose-Capillary: 177 mg/dL — ABNORMAL HIGH (ref 70–99)
Glucose-Capillary: 198 mg/dL — ABNORMAL HIGH (ref 70–99)

## 2020-10-06 LAB — MAGNESIUM: Magnesium: 1.9 mg/dL (ref 1.7–2.4)

## 2020-10-06 MED ORDER — ALBUTEROL SULFATE HFA 108 (90 BASE) MCG/ACT IN AERS: 2.0000 | INHALATION_SPRAY | Freq: Four times a day (QID) | RESPIRATORY_TRACT | 3 refills | Status: DC | PRN

## 2020-10-06 MED ORDER — NICOTINE 21 MG/24HR TD PT24: 21.0000 mg | MEDICATED_PATCH | Freq: Every day | TRANSDERMAL | 0 refills | Status: DC

## 2020-10-06 MED ORDER — BLOOD GLUCOSE METER KIT: PACK | 0 refills | Status: DC

## 2020-10-06 MED ORDER — ACETAMINOPHEN 325 MG PO TABS: 650.0000 mg | ORAL_TABLET | Freq: Four times a day (QID) | ORAL | Status: DC | PRN

## 2020-10-06 MED ORDER — TRAZODONE HCL 50 MG PO TABS: 50.0000 mg | ORAL_TABLET | Freq: Every evening | ORAL | 0 refills | Status: DC | PRN

## 2020-10-06 MED ORDER — METFORMIN HCL ER 500 MG PO TB24: ORAL_TABLET | ORAL | 2 refills | Status: AC

## 2020-10-06 MED ORDER — IBUPROFEN 800 MG PO TABS: 800.0000 mg | ORAL_TABLET | Freq: Three times a day (TID) | ORAL | 0 refills | Status: DC | PRN

## 2020-10-06 MED ORDER — ALBUTEROL SULFATE HFA 108 (90 BASE) MCG/ACT IN AERS: 2.0000 | INHALATION_SPRAY | Freq: Four times a day (QID) | RESPIRATORY_TRACT | 3 refills | Status: AC | PRN

## 2020-10-06 MED ORDER — SALINE SPRAY 0.65 % NA SOLN
1.0000 | NASAL | Status: DC | PRN
  Administered 2020-10-06: 1 via NASAL
  Filled 2020-10-06: qty 44

## 2020-10-06 MED ORDER — APIXABAN (ELIQUIS) VTE STARTER PACK (10MG AND 5MG): ORAL_TABLET | ORAL | 0 refills | Status: DC

## 2020-10-06 MED ORDER — BLOOD GLUCOSE METER KIT: PACK | 0 refills | Status: AC

## 2020-10-06 NOTE — Discharge Instructions (Signed)
IMPORTANT INFORMATION: PAY CLOSE ATTENTION   PHYSICIAN DISCHARGE INSTRUCTIONS  Follow with Primary care provider  Patient, No Pcp Per  and other consultants as instructed by your Hospitalist Physician  SEEK MEDICAL CARE OR RETURN TO EMERGENCY ROOM IF SYMPTOMS COME BACK, WORSEN OR NEW PROBLEM DEVELOPS   Please note: You were cared for by a hospitalist during your hospital stay. Every effort will be made to forward records to your primary care provider.  You can request that your primary care provider send for your hospital records if they have not received them.  Once you are discharged, your primary care physician will handle any further medical issues. Please note that NO REFILLS for any discharge medications will be authorized once you are discharged, as it is imperative that you return to your primary care physician (or establish a relationship with a primary care physician if you do not have one) for your post hospital discharge needs so that they can reassess your need for medications and monitor your lab values.  Please get a complete blood count and chemistry panel checked by your Primary MD at your next visit, and again as instructed by your Primary MD.  Get Medicines reviewed and adjusted: Please take all your medications with you for your next visit with your Primary MD  Laboratory/radiological data: Please request your Primary MD to go over all hospital tests and procedure/radiological results at the follow up, please ask your primary care provider to get all Hospital records sent to his/her office.  In some cases, they will be blood work, cultures and biopsy results pending at the time of your discharge. Please request that your primary care provider follow up on these results.  If you are diabetic, please bring your blood sugar readings with you to your follow up appointment with primary care.    Please call and make your follow up appointments as soon as possible.    Also Note  the following: If you experience worsening of your admission symptoms, develop shortness of breath, life threatening emergency, suicidal or homicidal thoughts you must seek medical attention immediately by calling 911 or calling your MD immediately  if symptoms less severe.  You must read complete instructions/literature along with all the possible adverse reactions/side effects for all the Medicines you take and that have been prescribed to you. Take any new Medicines after you have completely understood and accpet all the possible adverse reactions/side effects.   Do not drive when taking Pain medications or sleeping medications (Benzodiazepines)  Do not take more than prescribed Pain, Sleep and Anxiety Medications. It is not advisable to combine anxiety,sleep and pain medications without talking with your primary care practitioner  Special Instructions: If you have smoked or chewed Tobacco  in the last 2 yrs please stop smoking, stop any regular Alcohol  and or any Recreational drug use.  Wear Seat belts while driving.  Do not drive if taking any narcotic, mind altering or controlled substances or recreational drugs or alcohol.        

## 2020-10-06 NOTE — Progress Notes (Signed)
Patient states understanding of discharge instructions, prescriptions given 

## 2020-10-06 NOTE — TOC Transition Note (Signed)
Transition of Care [facility]) - CM/SW Discharge Note   Patient Details  Name: Donna Douglas MRN: [id number] Date of Birth: 06/28/1981  [facility] of [facility]) CM/SW Contact:  Barry Brunner, LCSW Phone Number: 10/06/2020, 12:32 PM   Clinical Narrative:    Patient requested a list of [facility] PCP providers. CSW provided patient with list and notified patient that PCP's in [place] might not take [place] medicaid. Patient reported that she will fill her medications in [place] and that [place]. CSW verified that [facility] covers Eliquis through [facility] search tool. TOC signing off.    Final next level of care: Home/Self Care Barriers to Discharge: Barriers Resolved   Patient Goals and CMS Choice Patient states their goals for this hospitalization and ongoing recovery are:: Return home CMS Medicare.gov Compare Post Acute Care list provided to:: Patient Choice offered to / list presented to : Patient  Discharge Placement                    Patient and family notified of of transfer: 10/06/20  Discharge Plan and Services                DME Arranged: N/A DME Agency: NA       HH Arranged: NA HH Agency: NA        Social Determinants of Health (SDOH) Interventions     Readmission Risk Interventions No flowsheet data found.

## 2020-10-06 NOTE — Telephone Encounter (Signed)
Pt called : pharmacy wont' accept electronically authorized prescriptions.  Pharmacy will accept if printed signed and faxed.  Reprinted.  Rn will fax to pharmacy.  [employer].    Maryln Manuel MD

## 2020-10-06 NOTE — Discharge Summary (Signed)
Physician Discharge Summary  Rolinda Impson YQI:[id number] DOB: Aug 30, 1981 DOA: 10/04/2020  Admit date: 10/04/2020 Discharge date: 10/06/2020  Admitted From:  Home  Disposition:  Home   Recommendations for Outpatient Follow-up:  1. Follow up with PCP in 1-2 weeks 2. Please titrate metformin outpatient to full therapeutic dose  Discharge Condition: STABLE   CODE STATUS: FULL   DIET: Carb Modified  Brief Hospitalization Summary: Please see all hospital notes, images, labs for full details of the hospitalization. ADMISSION HPI:  Donna Douglas  is a 40 y.o. 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 Patient reports that it started 1 week ago, and has been progressively worse. The pain is constant. It is 10/10. It feels like a sharp pressure. She has associated dyspnea and dry cough. She reports palpitations as well. Patient reports that this feels very similar to her previous PE. She is on depo, smokes 1.5ppd, and has had Pes in the past. Patient reports that the norco given in the ED doesn't touch her pain. Patient reports that since this has started she has had no appetite and has been having to "force" herself to eat. Patient has no other complaints  She does not drink EtOH, she does not use illicit drugs. She is not vacc'd for covid. She is Full Code.   In the ED T98, HR 75, R 16-24, BP 108/91 95% WBC 6.0. plt 309 Glucose 488 Gap 7 Trop 4, 6 CTA = BL Pulm emboli. No evidence of right heart strain EKG HR 88, SR, QTc 398 Norco, lovenox, and warfarin given in the ED INR 1.4 (despite having been on warfarin until switch to [facility])  Hospital Course   1. Acute bilateral PE - I had a long discussion with patient and she is agreeable to switching to apixaban for full anticoagulation and was reassured by case management that it [place].  Pt will discharge home with an  apixaban starter pack.  Counseling and bleeding precautions given and written information given.  Pt was advised that she will likely be on anticoagulation for life and cannot stop or miss doses of apixaban.  She was advised to stop smoking.  Close follow up with PCP was advised.  Pt verbalized full understanding.   Her 2D echocardiogram was reassuring.  See below.  TOC consulted to assist with PCP needs.   2. OSA - Pt needs outpatient sleep study done.    3. Tobacco - nicotine patch ordered.  Counseled extensively on tobacco cessation.   4. Chronic left popliteal DVT - unchanged from 2017.  No new DVT was seen on [place] of legs.  5. Uncontrolled type 2 diabetes mellitus - as evidenced by A1c of 9%.  Rx for Glucophage XR 500 mg with instructions to titrate to 1 po BID with meals.  Further titration outpatient with PCP.  Will offer flu and covid vaccine prior to discharge.       Discharge Diagnoses:  Active Problems:   Pulmonary embolism (HCC)   Chronic deep vein thrombosis (DVT) [facility])  Discharge Instructions: Discharge Instructions    Referral to Nutrition and Diabetes Services   Complete by: As directed    Choose type of Diabetes Self-Management Training (DSMT) training services and number of hours requested: Initial DSMT: 10 hours   Check all special needs that apply to patient requiring 1 on 1 DSMT: Low literacy   DSMT  Content: Comprehensive self-management skills- All of the content areas   Choose the type of Medical Nutrition Therapy (MNT) and number of hours: Initial MNT: 3 hours   FOR MEDICARE PATIENTS: I hereby certify that I am managing this beneficiary's diabetes condition and that the above prescribed training is a necessary part of management.: Does not apply     Allergies as of 10/06/2020   No Known Allergies     Medication List    STOP taking these medications   Eliquis 5 MG Tabs tablet Generic drug: apixaban Replaced by: Apixaban Starter Pack (79m and 591m   meloxicam 15 MG  tablet Commonly known as: MOBIC   warfarin 5 MG tablet Commonly known as: COUMADIN     TAKE these medications   acetaminophen 325 MG tablet Commonly known as: TYLENOL Take 2 tablets (650 mg total) by mouth every 6 (six) hours as needed for mild pain (or Fever >/= 101).   albuterol 108 (90 Base) MCG/ACT inhaler Commonly known as: Ventolin HFA Inhale 2 puffs into the lungs every 6 (six) hours as needed for wheezing or shortness of breath.   Apixaban Starter Pack (1073mnd 5mg110mommonly known as: ELIQUIS STARTER PACK Take as directed on package: start with two-5mg 61mlets twice daily for 7 days. On day 8, switch to one-5mg t45met twice daily. Replaces: Eliquis 5 MG Tabs tablet   blood glucose meter kit and supplies Dispense based on patient and insurance preference. Use up to four times daily as directed. (FOR ICD-10 E10.9, E11.9).   HYDROcodone-acetaminophen 5-325 MG tablet Commonly known as: NORCO/VICODIN   ibuprofen 800 MG tablet Commonly known as: ADVIL Take 1 tablet (800 mg total) by mouth every 8 (eight) hours as needed for moderate pain. What changed: reasons to take this   ipratropium-albuterol 0.5-2.5 (3) MG/3ML Soln Commonly known as: DUONEB Take 3 mLs by nebulization daily.   Meclizine HCl 25 MG Chew Chew 1 tablet by mouth daily as needed for dizziness.   metFORMIN 500 MG 24 hr tablet Commonly known as: Glucophage XR Take 1 daily with supper x 5 days, then 1 po BID with meals   nicotine 21 mg/24hr patch Commonly known as: NICODERM CQ - dosed in mg/24 hours Place 1 patch (21 mg total) onto the skin daily. Start taking on: October 07, 2020   traZODone 50 MG tablet Commonly known as: DESYREL Take 1 tablet (50 mg total) by mouth at bedtime as needed for sleep.       Follow-up Information    Primary care provider Follow up.              No Known Allergies Allergies as of 10/06/2020   No Known Allergies     Medication List    STOP taking these  medications   Eliquis 5 MG Tabs tablet Generic drug: apixaban Replaced by: Apixaban Starter Pack (10mg a26mmg)   75moxicam 15 MG tablet Commonly known as: MOBIC   warfarin 5 MG tablet Commonly known as: COUMADIN     TAKE these medications   acetaminophen 325 MG tablet Commonly known as: TYLENOL Take 2 tablets (650 mg total) by mouth every 6 (six) hours as needed for mild pain (or Fever >/= 101).   albuterol 108 (90 Base) MCG/ACT inhaler Commonly known as: Ventolin HFA Inhale 2 puffs into the lungs every 6 (six) hours as needed for wheezing or shortness of breath.   Apixaban Starter Pack (10mg and43m) Comm55my known as: ELIQUIS STARTER PACK Take as  directed on package: start with two-60m tablets twice daily for 7 days. On day 8, switch to one-560mtablet twice daily. Replaces: Eliquis 5 MG Tabs tablet   blood glucose meter kit and supplies Dispense based on patient and insurance preference. Use up to four times daily as directed. (FOR ICD-10 E10.9, E11.9).   HYDROcodone-acetaminophen 5-325 MG tablet Commonly known as: NORCO/VICODIN   ibuprofen 800 MG tablet Commonly known as: ADVIL Take 1 tablet (800 mg total) by mouth every 8 (eight) hours as needed for moderate pain. What changed: reasons to take this   ipratropium-albuterol 0.5-2.5 (3) MG/3ML Soln Commonly known as: DUONEB Take 3 mLs by nebulization daily.   Meclizine HCl 25 MG Chew Chew 1 tablet by mouth daily as needed for dizziness.   metFORMIN 500 MG 24 hr tablet Commonly known as: Glucophage XR Take 1 daily with supper x 5 days, then 1 po BID with meals   nicotine 21 mg/24hr patch Commonly known as: NICODERM CQ - dosed in mg/24 hours Place 1 patch (21 mg total) onto the skin daily. Start taking on: October 07, 2020   traZODone 50 MG tablet Commonly known as: DESYREL Take 1 tablet (50 mg total) by mouth at bedtime as needed for sleep.       Procedures/Studies: CT Angio Chest PE W and/or Wo  Contrast  Result Date: 10/04/2020 CLINICAL DATA:  Left side chest pain EXAM: CT ANGIOGRAPHY CHEST WITH CONTRAST TECHNIQUE: Multidetector CT imaging of the chest was performed using the standard protocol during bolus administration of intravenous contrast. Multiplanar CT image reconstructions and MIPs were obtained to evaluate the vascular anatomy. CONTRAST:  10087mMNIPAQUE IOHEXOL 350 MG/ML SOLN COMPARISON:  05/26/2020 FINDINGS: Cardiovascular: There are bilateral pulmonary emboli seen in both upper and lower lobes. No evidence of right heart strain. Aorta normal caliber. Heart is normal size. Mediastinum/Nodes: No mediastinal, hilar, or axillary adenopathy. Trachea and esophagus are unremarkable. Thyroid unremarkable. Lungs/Pleura: Minimal right base atelectasis.  No effusions. Upper Abdomen: Imaging into the upper abdomen demonstrates no acute findings. Musculoskeletal: Chest wall soft tissues are unremarkable. No acute bony abnormality. Review of the MIP images confirms the above findings. IMPRESSION: Bilateral pulmonary emboli.  No evidence of right heart strain. Critical Value/emergent results were called by telephone at the time of interpretation on 10/04/2020 at 11:56 pm to [facility] these results. Electronically Signed   By: KevRolm BaptiseD.   On: 10/04/2020 23:59   US Koreanous Img Lower Bilateral (DVT)  Result Date: 10/05/2020 CLINICAL DATA:  Bilateral lower extremity pain. History pulmonary embolism and previous left lower extremity DVT. Evaluate for acute or chronic DVT. EXAM: BILATERAL LOWER EXTREMITY VENOUS DOPPLER ULTRASOUND TECHNIQUE: Gray-scale sonography with graded compression, as well as color Doppler and duplex ultrasound were performed to evaluate the lower extremity deep venous systems from the level of the common femoral vein and including the common femoral, femoral, profunda femoral, popliteal and calf veins including the posterior tibial, peroneal  and gastrocnemius veins when visible. The superficial great saphenous vein was also interrogated. Spectral Doppler was utilized to evaluate flow at rest and with distal augmentation maneuvers in the common femoral, femoral and popliteal veins. COMPARISON:  Left lower extremity venous Doppler ultrasound-03/14/2016 (positive for DVT involving the left popliteal vein extending to involve the left posterior tibial vein) FINDINGS: RIGHT LOWER EXTREMITY Common Femoral Vein: No evidence of thrombus. Normal compressibility, respiratory phasicity and response to augmentation. Saphenofemoral Junction: No evidence of thrombus. Normal compressibility and  flow on color Doppler imaging. Profunda Femoral Vein: No evidence of thrombus. Normal compressibility and flow on color Doppler imaging. Femoral Vein: No evidence of thrombus. Normal compressibility, respiratory phasicity and response to augmentation. Popliteal Vein: No evidence of thrombus. Normal compressibility, respiratory phasicity and response to augmentation. Calf Veins: Appear patent where visualized. Superficial Great Saphenous Vein: No evidence of thrombus. Normal compressibility. Other Findings:  None. LEFT LOWER EXTREMITY Common Femoral Vein: No evidence of thrombus. Normal compressibility, respiratory phasicity and response to augmentation. Saphenofemoral Junction: No evidence of thrombus. Normal compressibility and flow on color Doppler imaging. Profunda Femoral Vein: No evidence of thrombus. Normal compressibility and flow on color Doppler imaging. Femoral Vein: No evidence of thrombus. Normal compressibility, respiratory phasicity and response to augmentation. Popliteal Vein: Grossly unchanged mixed echogenic occlusive DVT is seen within the left popliteal vein (images 54 through 57), similar to the 2017 examination. Calf Veins: Appear patent where visualized. Superficial Great Saphenous Vein: No evidence of thrombus. Normal compressibility. Other Findings:   None. IMPRESSION: 1. Chronic occlusive DVT involving the left popliteal vein, unchanged compared to the 2017 examination. 2. No evidence of acute DVT within the left lower extremity. 3. No evidence of acute or chronic DVT within the right lower extremity. Electronically Signed   By: Sandi Mariscal M.D.   On: 10/05/2020 09:46   ECHOCARDIOGRAM COMPLETE  Result Date: 10/05/2020    ECHOCARDIOGRAM REPORT   Patient Name:   Donna Douglas Date of Exam: 10/05/2020 Medical Rec #:  [id number]       Height:       65.0 in Accession #:    [id number]      Weight:       288.8 lb Date of Birth:  Aug 10, 1981       BSA:          2.312 m Patient Age:    40 years        BP:           119/67 mmHg Patient Gender: F               HR:           92 bpm. Exam Location:  [facility] Procedure: 2D Echo Indications:    Pulmonary Embolus 415.19 / I26.99  History:        Patient has prior history of Echocardiogram examinations, most                 recent 04/17/2017. Risk Factors:Current Smoker and Diabetes.                 Pulmonari Emboli, DVT.  Sonographer:    Leavy Cella RDCS (AE) Referring Phys: [id number] ASIA B [place]  1. Left ventricular ejection fraction, by estimation, is 60 to 65%. The left ventricle has normal function. Left ventricular endocardial border not optimally defined to evaluate regional wall motion. There is mild left ventricular hypertrophy. Left ventricular diastolic parameters were normal.  2. Right ventricular systolic function is normal. The right ventricular size is normal. Tricuspid regurgitation signal is inadequate for assessing PA pressure.  3. The mitral valve is normal in structure. Trivial mitral valve regurgitation. No evidence of mitral stenosis.  4. The aortic valve is grossly normal. Aortic valve regurgitation is not visualized. No aortic stenosis is present.  5. The inferior vena cava is normal in size with greater than 50% respiratory variability, suggesting right atrial pressure of 3 mmHg.  FINDINGS  Left Ventricle: Left ventricular ejection fraction, by  estimation, is 60 to 65%. The left ventricle has normal function. Left ventricular endocardial border not optimally defined to evaluate regional wall motion. The left ventricular internal cavity size was normal in size. There is mild left ventricular hypertrophy. Left ventricular diastolic parameters were normal. Right Ventricle: The right ventricular size is normal. No increase in right ventricular wall thickness. Right ventricular systolic function is normal. Tricuspid regurgitation signal is inadequate for assessing PA pressure. Left Atrium: Left atrial size was normal in size. Right Atrium: Right atrial size was normal in size. Pericardium: There is no evidence of pericardial effusion. Mitral Valve: The mitral valve is normal in structure. Trivial mitral valve regurgitation. No evidence of mitral valve stenosis. Tricuspid Valve: The tricuspid valve is normal in structure. Tricuspid valve regurgitation is trivial. No evidence of tricuspid stenosis. Aortic Valve: The aortic valve is grossly normal. Aortic valve regurgitation is not visualized. No aortic stenosis is present. Pulmonic Valve: The pulmonic valve was not well visualized. Pulmonic valve regurgitation is not visualized. No evidence of pulmonic stenosis. Aorta: The aortic root is normal in size and structure. Venous: The inferior vena cava is normal in size with greater than 50% respiratory variability, suggesting right atrial pressure of 3 mmHg. IAS/Shunts: The interatrial septum was not well visualized.  LEFT VENTRICLE PLAX 2D LVIDd:         4.22 cm  Diastology LVIDs:         2.63 cm  LV e' medial:    9.36 cm/s LV PW:         1.21 cm  LV E/e' medial:  9.3 LV IVS:        1.08 cm  LV e' lateral:   19.90 cm/s LVOT diam:     1.90 cm  LV E/e' lateral: 4.4 LVOT Area:     2.84 cm  RIGHT VENTRICLE RV S prime:     16.80 cm/s TAPSE (M-mode): 2.4 cm LEFT ATRIUM             Index LA diam:        4.00  cm 1.73 cm/m LA Vol (A2C):   24.7 ml 10.68 ml/m LA Vol (A4C):   18.6 ml 8.04 ml/m LA Biplane Vol: 22.2 ml 9.60 ml/m  MITRAL VALVE MV Area (PHT): 4.44 cm    SHUNTS MV Decel Time: 171 msec    Systemic Diam: 1.90 cm MV E velocity: 87.00 cm/s MV A velocity: 59.10 cm/s MV E/A ratio:  1.47 Cherlynn Kaiser MD Electronically signed by Cherlynn Kaiser MD Signature Date/Time: 10/05/2020/1:42:01 PM    Final       Subjective: Pt reports feeling much better today. Slept better.  Agreeable to following up with PCP as recommended.  She reports having [place] benefits.    Discharge Exam: Vitals:   10/06/20 0406 10/06/20 0729  BP: 110/76   Pulse: 93   Resp: 20   Temp: 98.4 F (36.9 C)   SpO2: 95% 95%   Vitals:   10/05/20 1938 10/05/20 2026 10/06/20 0406 10/06/20 0729  BP:  107/62 110/76   Pulse:  86 93   Resp:  18 20   Temp:  97.8 F (36.6 C) 98.4 F (36.9 C)   TempSrc:  Oral Oral   SpO2: 98% 96% 95% 95%  Weight:      Height:       General: Pt is alert, awake, not in acute distress Cardiovascular: normal S1/S2 +, no rubs, no gallops Respiratory: CTA bilaterally, no wheezing, no rhonchi Abdominal:  Soft, NT, ND, bowel sounds + Extremities: no edema, no cyanosis   The results of significant diagnostics from this hospitalization (including imaging, microbiology, ancillary and laboratory) are listed below for reference.     Microbiology: Recent Results (from the past 240 hour(s))  SARS Coronavirus 2 by RT PCR (hospital order, performed in [facility] hospital lab) Nasopharyngeal Nasopharyngeal Swab     Status: None   Collection Time: 10/05/20 12:35 AM   Specimen: Nasopharyngeal Swab  Result Value Ref Range Status   SARS Coronavirus 2 NEGATIVE NEGATIVE Final    Comment: (NOTE) SARS-CoV-2 target nucleic acids are NOT DETECTED.  The SARS-CoV-2 RNA is generally detectable in upper and lower respiratory specimens during the acute phase of infection. The lowest concentration of  SARS-CoV-2 viral copies this assay can detect is 250 copies / mL. A negative result does not preclude SARS-CoV-2 infection and should not be used as the sole basis for treatment or other patient management decisions.  A negative result may occur with improper specimen collection / handling, submission of specimen other than nasopharyngeal swab, presence of viral mutation(s) within the areas targeted by this assay, and inadequate number of viral copies (<250 copies / mL). A negative result must be combined with clinical observations, patient history, and epidemiological information.  Fact Sheet for Patients:   StrictlyIdeas.no  Fact Sheet for Healthcare Providers: BankingDealers.co.za  This test is not yet approved or  cleared by the [place] FDA and has been authorized for detection and/or diagnosis of SARS-CoV-2 by FDA under an Emergency Use Authorization (EUA).  This EUA will remain in effect (meaning this test can be used) for the duration of the COVID-19 declaration under Section 564(b)(1) of the Act, 21 U.S.C. section 360bbb-3(b)(1), unless the authorization is terminated or revoked sooner.  Performed at [facility], [address] [place], [place] [address]      Labs: BNP (last 3 results) No results for input(s): BNP in the last 8760 hours. Basic Metabolic Panel: Recent Labs  Lab 10/04/20 1927 10/05/20 0626 10/06/20 0631  NA 131* 134* 137  K 4.4 4.2 4.0  CL 98 98 106  CO2 26 28 26   GLUCOSE 488* 409* 193*  BUN 15 14 15   CREATININE 1.02* 0.99 0.84  CALCIUM 9.4 9.0 8.2*  MG  --  1.6* 1.9   Liver Function Tests: Recent Labs  Lab 10/05/20 0626  AST 18  ALT 21  ALKPHOS 93  BILITOT 0.1*  PROT 6.9  ALBUMIN 3.4*   No results for input(s): LIPASE, AMYLASE in the last 168 hours. No results for input(s): AMMONIA in the last 168 hours. CBC: Recent Labs  Lab 10/04/20 1927 10/05/20 0626 10/06/20 0631  WBC 6.0  5.7 6.1  NEUTROABS 3.8  --   --   HGB 9.4* 9.4* 8.6*  HCT 31.0* 31.6* 28.3*  MCV 76.9* 77.1* 78.0*  PLT 309 314 298   Cardiac Enzymes: No results for input(s): CKTOTAL, CKMB, CKMBINDEX, TROPONINI in the last 168 hours. BNP: Invalid input(s): POCBNP CBG: Recent Labs  Lab 10/05/20 1621 10/05/20 2027 10/06/20 0403 10/06/20 0734 10/06/20 1109  GLUCAP 244* 275* 221* 177* 198*   D-Dimer No results for input(s): DDIMER in the last 72 hours. Hgb A1c Recent Labs    10/05/20 0626  HGBA1C 9.1*   Lipid Profile No results for input(s): CHOL, HDL, LDLCALC, TRIG, CHOLHDL, LDLDIRECT in the last 72 hours. Thyroid function studies No results for input(s): TSH, T4TOTAL, T3FREE, THYROIDAB in the last 72 hours.  Invalid input(s): FREET3 Anemia work up No results for input(s): VITAMINB12, FOLATE, FERRITIN, TIBC, IRON, RETICCTPCT in the last 72 hours. Urinalysis    Component Value Date/Time   COLORURINE YELLOW 03/01/2018 1133   APPEARANCEUR CLEAR 03/01/2018 1133   LABSPEC >1.030 (H) 03/01/2018 1133   PHURINE 6.0 03/01/2018 1133   GLUCOSEU NEGATIVE 03/01/2018 1133   HGBUR NEGATIVE 03/01/2018 1133   BILIRUBINUR NEGATIVE 03/01/2018 1133   KETONESUR NEGATIVE 03/01/2018 1133   PROTEINUR NEGATIVE 03/01/2018 1133   NITRITE NEGATIVE 03/01/2018 1133   LEUKOCYTESUR NEGATIVE 03/01/2018 1133   Sepsis Labs Invalid input(s): PROCALCITONIN,  WBC,  LACTICIDVEN Microbiology Recent Results (from the past 240 hour(s))  SARS Coronavirus 2 by RT PCR (hospital order, performed in [place] hospital lab) Nasopharyngeal Nasopharyngeal Swab     Status: None   Collection Time: 10/05/20 12:35 AM   Specimen: Nasopharyngeal Swab  Result Value Ref Range Status   SARS Coronavirus 2 NEGATIVE NEGATIVE Final    Comment: (NOTE) SARS-CoV-2 target nucleic acids are NOT DETECTED.  The SARS-CoV-2 RNA is generally detectable in upper and lower respiratory specimens during the acute phase of infection. The  lowest concentration of SARS-CoV-2 viral copies this assay can detect is 250 copies / mL. A negative result does not preclude SARS-CoV-2 infection and should not be used as the sole basis for treatment or other patient management decisions.  A negative result may occur with improper specimen collection / handling, submission of specimen other than nasopharyngeal swab, presence of viral mutation(s) within the areas targeted by this assay, and inadequate number of viral copies (<250 copies / mL). A negative result must be combined with clinical observations, patient history, and epidemiological information.  Fact Sheet for Patients:   StrictlyIdeas.no  Fact Sheet for Healthcare Providers: BankingDealers.co.za  This test is not yet approved or  cleared by the [place] FDA and has been authorized for detection and/or diagnosis of SARS-CoV-2 by FDA under an Emergency Use Authorization (EUA).  This EUA will remain in effect (meaning this test can be used) for the duration of the COVID-19 declaration under Section 564(b)(1) of the Act, 21 U.S.C. section 360bbb-3(b)(1), unless the authorization is terminated or revoked sooner.  Performed at [facility], [address], [place],  [address]    Time coordinating discharge: 43 minutes   SIGNED:  Irwin Brakeman, MD  [facility] 10/06/2020, 11:14 AM How to contact the [facility] Attending or Consulting provider [place] or covering provider during after hours [place], for this patient?  1. Check the care team in [facility] and look for a) attending/consulting [facility] provider listed and b) the [facility] team listed 2. Log into [email or web address] and use [facility] universal password to access. If you do not have the password, please contact the hospital operator. 3. Locate the [facility] provider you are looking for under [facility] and page to a number that you can be directly reached. 4. If you still  have difficulty reaching the provider, please page the [facility] (Director on Call) for the Hospitalists listed on amion for assistance.

## 2020-10-09 ENCOUNTER — Telehealth: Payer: Self-pay | Admitting: Nutrition

## 2020-10-09 NOTE — Telephone Encounter (Signed)
TC from patient. Went to ER [facility] last night via ambulance and   BS was 430 in  ER. While in ER came down to 250's Was admitted to [facility] Friday night  Admitted for ches tpain, shortness of breath, high blood sugars and discharged on Sunday. She reports she was told she has 2 blood clots, one in her lung and one in her leg. She notes she does still hve some SOB. Doesn't have a PCP. Has a list of providers to contact to make appointment. Patient concerned that her BS are in the 300's and her head is hurting with a headache.. She is on her way to see her cardiologist now. Advised to drink water and test blood sugars every hour and discuss with Cardiologist when she gets to her appointment.  Advised to call [facility] for new pt appointment to establish primary care. She verbalized understanding. She is only on Metformin XR 500 mg a day and will increase to BID in 5 days. First visit with me virtually will be 10-14-20.

## 2020-10-14 ENCOUNTER — Telehealth: Payer: Self-pay | Admitting: Nutrition

## 2020-10-14 ENCOUNTER — Encounter: Payer: Medicaid - Out of State | Attending: Family Medicine | Admitting: Nutrition

## 2020-10-14 ENCOUNTER — Ambulatory Visit: Payer: Medicaid - Out of State | Admitting: Nutrition

## 2020-10-14 NOTE — Telephone Encounter (Signed)
TC x 2 for phone visit. No answer. No VM available. She doesn't have MYCHART acct set up yet.

## 2020-10-22 ENCOUNTER — Other Ambulatory Visit: Payer: Self-pay

## 2020-10-22 ENCOUNTER — Encounter: Payer: Medicaid - Out of State | Attending: Family Medicine | Admitting: Nutrition

## 2020-10-22 ENCOUNTER — Encounter: Payer: Self-pay | Admitting: Nutrition

## 2020-10-22 VITALS — Wt 275.0 lb

## 2020-10-22 DIAGNOSIS — E1165 Type 2 diabetes mellitus with hyperglycemia: Secondary | ICD-10-CM | POA: Insufficient documentation

## 2020-10-22 DIAGNOSIS — E079 Disorder of thyroid, unspecified: Secondary | ICD-10-CM | POA: Insufficient documentation

## 2020-10-22 DIAGNOSIS — IMO0002 Reserved for concepts with insufficient information to code with codable children: Secondary | ICD-10-CM

## 2020-10-22 DIAGNOSIS — E118 Type 2 diabetes mellitus with unspecified complications: Secondary | ICD-10-CM | POA: Insufficient documentation

## 2020-10-22 NOTE — Progress Notes (Signed)
Medical Nutrition Therapy  Appointment Start time:  1300  Appointment End time:  1400  Primary concerns today: Dm Type 2, Obesity  Referral diagnosis:E11.8 She admits to being depressed. She notes she has had some blood clots and just got out of the hospital. Doesn't have a PCP. Referred to [facility].  Preferred learning style:  no preference indicated Learning readiness:  Ready    NUTRITION ASSESSMENT   Anthropometrics  Wt Readings from Last 3 Encounters:  10/22/20 275 lb (124.7 kg)  10/05/20 288 lb 12.8 oz (131 kg)  05/26/20 270 lb (122.5 kg)   Ht Readings from Last 3 Encounters:  10/05/20 5\' 5"  (1.651 m)  05/26/20 5\' 5"  (1.651 m)  11/14/19 5\' 5"  (1.651 m)   Body mass index is 45.76 kg/m. @BMIFA @ Facility age limit for growth percentiles is 20 years. Facility age limit for growth percentiles is 20 years.   Clinical Medical Hx: PE, DVT, Obesity, DM Type 2 Medications:metformin 500 mg BID,  Labs: A1C 9.1%. FBS 200's HS 270-300's  Notable Signs/Symptoms: Increased thirst, frequent urination, fatigue, blurry vision  Lifestyle & Dietary Hx Lives with her multiple.kids.   Hasn't seen a PCP in quite a few years. She does the cooking and shopping at home. Has a one touch meter to test blood sugars but needs the Accucheck Guide due to being on Medicaid   Estimated daily fluid intake: 64 oz Supplements: none Sleep: 6 hrs  Stress / self-care:  Cares for her kids and family  Current average weekly physical activity: ADL   24-Hr Dietary Recall First Meal: eggs 2, 1/2 slice toast and 2 slices of bacon, OJ,-4-6 oz Snack:  Second Meal: sandwich on wheat bread, with mayo, bag lays chips, 16 oz of water Snack: ritz crackers with pb- nabs, water Third Meal: BBQ ribs, squash , 1/2 c, 1/2 roll, water Snack:  Beverages: water   Estimated Energy Needs Calories: 1400  Carbohydrate: 158 g Protein: 88g Fat: 47g   NUTRITION DIAGNOSIS  NB-1.1 Food and nutrition-related  knowledge deficit As related to Diabetes Type 2.  As evidenced by A1C 9.1%.   NUTRITION INTERVENTION  Nutrition education (E-1) on the following topics:  . Nutrition and Diabetes education provided on My Plate, CHO counting, meal planning, portion sizes, timing of meals, avoiding snacks between meals unless having a low blood sugar, target ranges for A1C and blood sugars, signs/symptoms and treatment of hyper/hypoglycemia, monitoring blood sugars, taking medications as prescribed, benefits of exercising 30 minutes per day and prevention of complications of DM. 11/16/19   Handouts Provided Include  My Plate, S?S hyper/hypoglycemia, Carb counting, diabetes instructions, weight loss tips  Learning Style & Readiness for Change Teaching method utilized: Visual & Auditory  Demonstrated degree of understanding via: Teach Back  Barriers to learning/adherence to lifestyle change: none  Goals Established by Pt Goals  Follow My Plate Eat 2-3 carb choices per meals Cut out snacks between meals Eat meals on time Increase lower carb vegetables. Cut out processed foods. Get A1C down to 7%. Make appt for new patient appt at [facility] Ask for referral to Dr. , Endocrinology after seeing PCP. Walk 30 minutes a day.   MONITORING & EVALUATION Dietary intake, weekly physical activity, and blood sugars  in 1 month. Recommend referral to counseling and treatment for depression.  Next Steps  Patient is to start a food journal and BS log.[place]

## 2020-10-22 NOTE — Patient Instructions (Signed)
Goals  Follow My Plate Eat 2-3 carb choices per meals Cut out snacks between meals Eat meals on time Increase lower carb vegetables. Cut out processed foods. Get A1C down to 7%. Make appt for new patient appt at [facility] Ask for referral to Dr. Fransico Him, Endocrinology after seeing PCP. Walk 30 minutes a day.

## 2020-11-19 ENCOUNTER — Ambulatory Visit: Payer: Medicaid - Out of State | Admitting: Nutrition

## 2022-07-18 ENCOUNTER — Emergency Department (HOSPITAL_COMMUNITY)
Admission: EM | Admit: 2022-07-18 | Discharge: 2022-07-18 | Disposition: A | Discharge status: Home / Self Care | Payer: Medicaid Other | Attending: Emergency Medicine | Admitting: Emergency Medicine

## 2022-07-18 ENCOUNTER — Encounter (HOSPITAL_COMMUNITY): Payer: Self-pay | Admitting: Emergency Medicine

## 2022-07-18 ENCOUNTER — Other Ambulatory Visit: Payer: Self-pay

## 2022-07-18 ENCOUNTER — Emergency Department (HOSPITAL_COMMUNITY): Payer: Medicaid Other

## 2022-07-18 DIAGNOSIS — J45909 Unspecified asthma, uncomplicated: Secondary | ICD-10-CM | POA: Diagnosis not present

## 2022-07-18 DIAGNOSIS — R1031 Right lower quadrant pain: Secondary | ICD-10-CM | POA: Insufficient documentation

## 2022-07-18 DIAGNOSIS — F17210 Nicotine dependence, cigarettes, uncomplicated: Secondary | ICD-10-CM | POA: Diagnosis not present

## 2022-07-18 DIAGNOSIS — R112 Nausea with vomiting, unspecified: Secondary | ICD-10-CM | POA: Diagnosis not present

## 2022-07-18 DIAGNOSIS — E119 Type 2 diabetes mellitus without complications: Secondary | ICD-10-CM | POA: Insufficient documentation

## 2022-07-18 DIAGNOSIS — R109 Unspecified abdominal pain: Secondary | ICD-10-CM

## 2022-07-18 LAB — URINALYSIS, ROUTINE W REFLEX MICROSCOPIC
Bacteria, UA: NONE SEEN
Bilirubin Urine: NEGATIVE
Glucose, UA: NEGATIVE mg/dL
Hgb urine dipstick: NEGATIVE
Ketones, ur: NEGATIVE mg/dL
Nitrite: NEGATIVE
Protein, ur: 100 mg/dL — AB
Specific Gravity, Urine: 1.028 (ref 1.005–1.030)
pH: 5 (ref 5.0–8.0)

## 2022-07-18 LAB — I-STAT BETA HCG BLOOD, ED (MC, WL, AP ONLY): I-stat hCG, quantitative: <5 m[IU]/mL (ref <5)

## 2022-07-18 LAB — CBC
HCT: 30.5 % — ABNORMAL LOW (ref 36.0–46.0)
Hemoglobin: 8.9 g/dL — ABNORMAL LOW (ref 12.0–15.0)
MCH: 20.6 pg — ABNORMAL LOW (ref 26.0–34.0)
MCHC: 29.2 g/dL — ABNORMAL LOW (ref 30.0–36.0)
MCV: 70.6 fL — ABNORMAL LOW (ref 80.0–100.0)
Platelets: 437 10*3/uL — ABNORMAL HIGH (ref 150–400)
RBC: 4.32 MIL/uL (ref 3.87–5.11)
RDW: 19.2 % — ABNORMAL HIGH (ref 11.5–15.5)
WBC: 9.3 10*3/uL (ref 4.0–10.5)
nRBC: 0 % (ref 0.0–0.2)

## 2022-07-18 LAB — LIPASE, BLOOD: Lipase: 31 U/L (ref 11–51)

## 2022-07-18 LAB — COMPREHENSIVE METABOLIC PANEL
ALT: 12 U/L (ref 0–44)
AST: 15 U/L (ref 15–41)
Albumin: 3.7 g/dL (ref 3.5–5.0)
Alkaline Phosphatase: 94 U/L (ref 38–126)
Anion gap: 8 (ref 5–15)
BUN: 15 mg/dL (ref 6–20)
CO2: 26 mmol/L (ref 22–32)
Calcium: 8.9 mg/dL (ref 8.9–10.3)
Chloride: 105 mmol/L (ref 98–111)
Creatinine, Ser: 0.85 mg/dL (ref 0.44–1.00)
GFR, Estimated: >60 mL/min (ref >60)
Glucose, Bld: 185 mg/dL — ABNORMAL HIGH (ref 70–99)
Potassium: 3.7 mmol/L (ref 3.5–5.1)
Sodium: 139 mmol/L (ref 135–145)
Total Bilirubin: 0.3 mg/dL (ref 0.3–1.2)
Total Protein: 7.4 g/dL (ref 6.5–8.1)

## 2022-07-18 MED ORDER — PHENAZOPYRIDINE HCL 100 MG PO TABS
100.0000 mg | ORAL_TABLET | Freq: Once | ORAL | Status: AC
  Administered 2022-07-18: 100 mg via ORAL
  Filled 2022-07-18: qty 1

## 2022-07-18 MED ORDER — NAPROXEN 500 MG PO TABS: 500.0000 mg | ORAL_TABLET | Freq: Two times a day (BID) | ORAL | 0 refills | Status: DC

## 2022-07-18 MED ORDER — SODIUM CHLORIDE 0.9 % IV BOLUS
1000.0000 mL | Freq: Once | INTRAVENOUS | Status: AC
  Administered 2022-07-18: 1000 mL via INTRAVENOUS

## 2022-07-18 MED ORDER — HYDROMORPHONE HCL 1 MG/ML IJ SOLN
1.0000 mg | Freq: Once | INTRAMUSCULAR | Status: AC
  Administered 2022-07-18: 1 mg via INTRAVENOUS
  Filled 2022-07-18: qty 1

## 2022-07-18 MED ORDER — CEPHALEXIN 500 MG PO CAPS
500.0000 mg | ORAL_CAPSULE | Freq: Once | ORAL | Status: AC
  Administered 2022-07-18: 500 mg via ORAL
  Filled 2022-07-18: qty 1

## 2022-07-18 MED ORDER — POLYETHYLENE GLYCOL 3350 17 G PO PACK: 17.0000 g | PACK | Freq: Every day | ORAL | 1 refills | Status: DC

## 2022-07-18 MED ORDER — PHENAZOPYRIDINE HCL 200 MG PO TABS: 200.0000 mg | ORAL_TABLET | Freq: Three times a day (TID) | ORAL | 0 refills | Status: DC | PRN

## 2022-07-18 MED ORDER — ONDANSETRON HCL 4 MG/2ML IJ SOLN
4.0000 mg | Freq: Once | INTRAMUSCULAR | Status: AC
  Administered 2022-07-18: 4 mg via INTRAVENOUS
  Filled 2022-07-18: qty 2

## 2022-07-18 MED ORDER — KETOROLAC TROMETHAMINE 15 MG/ML IJ SOLN
15.0000 mg | Freq: Once | INTRAMUSCULAR | Status: AC
  Administered 2022-07-18: 15 mg via INTRAVENOUS
  Filled 2022-07-18: qty 1

## 2022-07-18 MED ORDER — IOHEXOL 350 MG/ML SOLN
100.0000 mL | Freq: Once | INTRAVENOUS | Status: AC | PRN
  Administered 2022-07-18: 100 mL via INTRAVENOUS

## 2022-07-18 MED ORDER — CEPHALEXIN 500 MG PO CAPS: 500.0000 mg | ORAL_CAPSULE | Freq: Three times a day (TID) | ORAL | 0 refills | Status: AC

## 2022-07-18 NOTE — ED Triage Notes (Signed)
Pt c/o lower right sided abdominal 8/10 sharp pain X3 days.  Pt also c/o n/v/d as well.

## 2022-07-18 NOTE — ED Notes (Signed)
Pt ambulated to the bathroom w/ no issues at this time.

## 2022-07-18 NOTE — ED Notes (Signed)
Pt to CT

## 2022-07-18 NOTE — Discharge Instructions (Signed)
You were evaluated in the Emergency Department and after careful evaluation, we did not find any emergent condition requiring admission or further testing in the hospital.  Your exam/testing today is overall reassuring.  Symptoms may be due to urinary tract infection and/or constipation.  Use the MiraLAX to treat the constipation.  Can use this medicine up to 6 times daily.  Take the Keflex antibiotic as directed to clear the infection.  Use the Pyridium medication up to 3 times daily for lower abdominal discomfort.  Also recommend taking the Naprosyn twice daily for pain.  Please return to the Emergency Department if you experience any worsening of your condition.   Thank you for allowing [place] to be a part of your care.

## 2022-07-18 NOTE — ED Provider Notes (Signed)
[facility] Emergency Department Provider Note MRN:  [id number]  Arrival date & time: 07/18/22     Chief Complaint   Abdominal Pain and Emesis   History of Present Illness   Donna Douglas is a 41 y.o. year-old female with a history of diabetes disease, DVT/PE presenting to the ED with chief complaint of abdominal pain and vomiting.  3 days of right lower quadrant abdominal pain associated with nausea and vomiting.  Denies fever, no chest pain or shortness of breath.  No vaginal bleeding or discharge.  Last menstrual period was on November 7.  Review of Systems  A thorough review of systems was obtained and all systems are negative except as noted in the HPI and PMH.   Patient's Health History    Past Medical History:  Diagnosis Date   Asthma    Diabetes mellitus without complication (HCC)    DVT (deep venous thrombosis) (HCC)    Pulmonary emboli (HCC)    Sleep apnea    Thyroid disease     Past Surgical History:  Procedure Laterality Date   anxiety     IVC FILTER PLACEMENT ([facility] HX)     SALPINGOOPHORECTOMY Right 03/01/2018   Procedure: LAPARATOMY WITH SALPINGO OOPHORECTOMY RIGHT SIDE;  Surgeon: Lazaro Arms, MD;  Location: [facility] ORS;  Service: Gynecology;  Laterality: Right;    Family History  Problem Relation Age of Onset   Asthma Father    Diabetes Mother    Hypertension Mother     Social History   Socioeconomic History   Marital status: Single    Spouse name: Not on file   Number of children: Not on file   Years of education: Not on file   Highest education level: Not on file  Occupational History   Not on file  Tobacco Use   Smoking status: Every Day    Packs/day: 0.50    Types: Cigarettes   Smokeless tobacco: Never  Vaping Use   Vaping Use: Never used  Substance and Sexual Activity   Alcohol use: No   Drug use: Not Currently    Types: Marijuana    Comment: denies use 04/06/18   Sexual activity: Yes    Birth control/protection:  None  Other Topics Concern   Not on file  Social History Narrative   na   Social Determinants of Health   Financial Resource Strain: Not on file  Food Insecurity: Not on file  Transportation Needs: Not on file  Physical Activity: Not on file  Stress: Not on file  Social Connections: Not on file  Intimate Partner Violence: Not on file     Physical Exam   Vitals:   07/18/22 0311  BP: (!) 124/94  Pulse: 92  Resp: 18  Temp: 98.4 F (36.9 C)  SpO2: 96%    CONSTITUTIONAL: Well-appearing, NAD NEURO/PSYCH:  Alert and oriented x 3, no focal deficits EYES:  eyes equal and reactive ENT/NECK:  no LAD, no JVD CARDIO: Regular rate, well-perfused, normal S1 and S2 PULM:  CTAB no wheezing or rhonchi GI/GU:  non-distended, non-tender MSK/SPINE:  No gross deformities, no edema SKIN:  no rash, atraumatic   *Additional and/or pertinent findings included in MDM below  Diagnostic and Interventional Summary    EKG Interpretation  Date/Time:    Ventricular Rate:    PR Interval:    QRS Duration:   QT Interval:    QTC Calculation:   R Axis:     Text Interpretation:  Labs Reviewed  CBC - Abnormal; Notable for the following components:      Result Value   Hemoglobin 8.9 (*)    HCT 30.5 (*)    MCV 70.6 (*)    MCH 20.6 (*)    MCHC 29.2 (*)    RDW 19.2 (*)    Platelets 437 (*)    All other components within normal limits  COMPREHENSIVE METABOLIC PANEL - Abnormal; Notable for the following components:   Glucose, Bld 185 (*)    All other components within normal limits  URINALYSIS, ROUTINE W REFLEX MICROSCOPIC - Abnormal; Notable for the following components:   APPearance HAZY (*)    Protein, ur 100 (*)    Leukocytes,Ua LARGE (*)    All other components within normal limits  URINE CULTURE  LIPASE, BLOOD  I-STAT BETA HCG BLOOD, ED ([facility], [facility], [facility] ONLY)    CT ABDOMEN PELVIS W CONTRAST  Final Result      Medications  cephALEXin (KEFLEX) capsule 500 mg (has no  administration in time range)  phenazopyridine (PYRIDIUM) tablet 100 mg (has no administration in time range)  ketorolac (TORADOL) 15 MG/ML injection 15 mg (has no administration in time range)  sodium chloride 0.9 % bolus 1,000 mL (1,000 mLs Intravenous New Bag/Given 07/18/22 0346)  ondansetron (ZOFRAN) injection 4 mg (4 mg Intravenous Given 07/18/22 0336)  HYDROmorphone (DILAUDID) injection 1 mg (1 mg Intravenous Given 07/18/22 0338)  iohexol (OMNIPAQUE) 350 MG/ML injection 100 mL (100 mLs Intravenous Contrast Given 07/18/22 0504)     Procedures  /  Critical Care Procedures  ED Course and Medical Decision Making  Initial Impression and Ddx Differential diagnosis includes appendicitis, ovarian cyst, torsion also considered but felt to be much less likely.  Kidney stone, pyelonephritis, UTI, PID/TOA, constipation, colitis or other considerations.  Medical/surgical history that increases complexity of ED encounter: None  Interpretation of Diagnostics I personally reviewed the laboratory assessment and my interpretation is as follows: No significant blood count or electrolyte changes, has anemia that is chronic.  CT is without emergent process, does reveal constipation.  Evidence of possible bladder wall irritation or inflammation, urinalysis with possible signs of UTI.    Patient Reassessment and Ultimate Disposition/Management     Patient feeling better, appropriate for discharge with home treatment of constipation and/or UTI.  Patient management required discussion with the following services or consulting groups:  None  Complexity of Problems Addressed Acute illness or injury that poses threat of life of bodily function  Additional Data Reviewed and Analyzed Further history obtained from: Further history from spouse/family member  Additional Factors Impacting ED Encounter Risk Prescriptions  Elmer Sow. Pilar Plate, MD [facility] Emergency Medicine [facility]  Health [email or web address]  Final Clinical Impressions(s) / ED Diagnoses     ICD-10-CM   1. Abdominal pain, unspecified abdominal location  R10.9       ED Discharge Orders          Ordered    polyethylene glycol (MIRALAX) 17 g packet  Daily        07/18/22 0553    cephALEXin (KEFLEX) 500 MG capsule  3 times daily        07/18/22 0553    naproxen (NAPROSYN) 500 MG tablet  2 times daily        07/18/22 0553    phenazopyridine (PYRIDIUM) 200 MG tablet  3 times daily PRN        07/18/22 0553  Discharge Instructions Discussed with and Provided to Patient:     Discharge Instructions      You were evaluated in the Emergency Department and after careful evaluation, we did not find any emergent condition requiring admission or further testing in the hospital.  Your exam/testing today is overall reassuring.  Symptoms may be due to urinary tract infection and/or constipation.  Use the MiraLAX to treat the constipation.  Can use this medicine up to 6 times daily.  Take the Keflex antibiotic as directed to clear the infection.  Use the Pyridium medication up to 3 times daily for lower abdominal discomfort.  Also recommend taking the Naprosyn twice daily for pain.  Please return to the Emergency Department if you experience any worsening of your condition.   Thank you for allowing [place] to be a part of your care.       Sabas Sous, MD 07/18/22 [phone]

## 2022-07-22 LAB — URINE CULTURE

## 2022-12-25 ENCOUNTER — Other Ambulatory Visit: Payer: Self-pay

## 2022-12-25 ENCOUNTER — Emergency Department (HOSPITAL_COMMUNITY): Payer: Medicaid Other

## 2022-12-25 ENCOUNTER — Emergency Department (HOSPITAL_COMMUNITY)
Admission: EM | Admit: 2022-12-25 | Discharge: 2022-12-26 | Disposition: A | Discharge status: Home / Self Care | Payer: Medicaid Other | Attending: Emergency Medicine | Admitting: Emergency Medicine

## 2022-12-25 ENCOUNTER — Encounter (HOSPITAL_COMMUNITY): Payer: Self-pay

## 2022-12-25 DIAGNOSIS — E119 Type 2 diabetes mellitus without complications: Secondary | ICD-10-CM | POA: Diagnosis not present

## 2022-12-25 DIAGNOSIS — R079 Chest pain, unspecified: Secondary | ICD-10-CM | POA: Diagnosis not present

## 2022-12-25 DIAGNOSIS — F17210 Nicotine dependence, cigarettes, uncomplicated: Secondary | ICD-10-CM | POA: Diagnosis not present

## 2022-12-25 DIAGNOSIS — J45909 Unspecified asthma, uncomplicated: Secondary | ICD-10-CM | POA: Insufficient documentation

## 2022-12-25 DIAGNOSIS — R0602 Shortness of breath: Secondary | ICD-10-CM | POA: Insufficient documentation

## 2022-12-25 LAB — BASIC METABOLIC PANEL
Anion gap: 6 (ref 5–15)
BUN: 12 mg/dL (ref 6–20)
CO2: 24 mmol/L (ref 22–32)
Calcium: 8.7 mg/dL — ABNORMAL LOW (ref 8.9–10.3)
Chloride: 105 mmol/L (ref 98–111)
Creatinine, Ser: 0.77 mg/dL (ref 0.44–1.00)
GFR, Estimated: >60 mL/min (ref >60)
Glucose, Bld: 138 mg/dL — ABNORMAL HIGH (ref 70–99)
Potassium: 3.7 mmol/L (ref 3.5–5.1)
Sodium: 135 mmol/L (ref 135–145)

## 2022-12-25 LAB — CBC
HCT: 33.5 % — ABNORMAL LOW (ref 36.0–46.0)
Hemoglobin: 9.4 g/dL — ABNORMAL LOW (ref 12.0–15.0)
MCH: 19.6 pg — ABNORMAL LOW (ref 26.0–34.0)
MCHC: 28.1 g/dL — ABNORMAL LOW (ref 30.0–36.0)
MCV: 69.9 fL — ABNORMAL LOW (ref 80.0–100.0)
Platelets: 456 10*3/uL — ABNORMAL HIGH (ref 150–400)
RBC: 4.79 MIL/uL (ref 3.87–5.11)
RDW: 22.4 % — ABNORMAL HIGH (ref 11.5–15.5)
WBC: 10.3 10*3/uL (ref 4.0–10.5)
nRBC: 0 % (ref 0.0–0.2)

## 2022-12-25 LAB — D-DIMER, QUANTITATIVE: D-Dimer, Quant: 0.5 ug/mL-FEU (ref 0.00–0.50)

## 2022-12-25 LAB — TROPONIN I (HIGH SENSITIVITY)
Troponin I (High Sensitivity): 4 ng/L (ref <18)
Troponin I (High Sensitivity): 3 ng/L (ref <18)

## 2022-12-25 MED ORDER — APIXABAN 5 MG PO TABS
5.0000 mg | ORAL_TABLET | Freq: Once | ORAL | Status: AC
  Administered 2022-12-26: 5 mg via ORAL
  Filled 2022-12-25: qty 1

## 2022-12-25 MED ORDER — IPRATROPIUM-ALBUTEROL 0.5-2.5 (3) MG/3ML IN SOLN
3.0000 mL | Freq: Once | RESPIRATORY_TRACT | Status: AC
  Administered 2022-12-25: 3 mL via RESPIRATORY_TRACT
  Filled 2022-12-25: qty 3

## 2022-12-25 MED ORDER — MORPHINE SULFATE (PF) 4 MG/ML IV SOLN
4.0000 mg | Freq: Once | INTRAVENOUS | Status: AC
  Administered 2022-12-25: 4 mg via INTRAVENOUS
  Filled 2022-12-25: qty 1

## 2022-12-25 MED ORDER — ALBUTEROL SULFATE (2.5 MG/3ML) 0.083% IN NEBU
INHALATION_SOLUTION | RESPIRATORY_TRACT | Status: AC
  Administered 2022-12-25: 2.5 mg
  Filled 2022-12-25: qty 3

## 2022-12-25 NOTE — ED Provider Notes (Signed)
[facility] Emergency Department Provider Note MRN:  [id number]  Arrival date & time: 12/26/22     Chief Complaint   Shortness of Breath   History of Present Illness   Donna Douglas is a 42 y.o. year-old female with a history of pulmonary embolism presenting to the ED with chief complaint of shortness of breath.  Chest pain or shortness of breath for the past several hours.  Pain described as sharp, worse with deep breaths.  Left-sided pain.  Feels similar to prior pulmonary emboli.  Has been running short on her Eliquis for the past few weeks, has been having to miss doses.  Ran out last night.  Review of Systems  A thorough review of systems was obtained and all systems are negative except as noted in the HPI and PMH.   Patient's Health History    Past Medical History:  Diagnosis Date   Asthma    Diabetes mellitus without complication (HCC)    DVT (deep venous thrombosis) (HCC)    Pulmonary emboli (HCC)    Sleep apnea    Thyroid disease     Past Surgical History:  Procedure Laterality Date   anxiety     IVC FILTER PLACEMENT ([facility] HX)     SALPINGOOPHORECTOMY Right 03/01/2018   Procedure: LAPARATOMY WITH SALPINGO OOPHORECTOMY RIGHT SIDE;  Surgeon: Lazaro Arms, MD;  Location: [facility] ORS;  Service: Gynecology;  Laterality: Right;    Family History  Problem Relation Age of Onset   Asthma Father    Diabetes Mother    Hypertension Mother     Social History   Socioeconomic History   Marital status: Single    Spouse name: Not on file   Number of children: Not on file   Years of education: Not on file   Highest education level: Not on file  Occupational History   Not on file  Tobacco Use   Smoking status: Every Day    Packs/day: .5    Types: Cigarettes   Smokeless tobacco: Never  Vaping Use   Vaping Use: Never used  Substance and Sexual Activity   Alcohol use: No   Drug use: Not Currently    Types: Marijuana    Comment: denies use 04/06/18    Sexual activity: Yes    Birth control/protection: None  Other Topics Concern   Not on file  Social History Narrative   na   Social Determinants of Health   Financial Resource Strain: Not on file  Food Insecurity: Not on file  Transportation Needs: Not on file  Physical Activity: Not on file  Stress: Not on file  Social Connections: Not on file  Intimate Partner Violence: Not on file     Physical Exam   Vitals:   12/25/22 2300 12/25/22 2333  BP: 121/63   Pulse: 74   Resp: 16   Temp: 98.7 F (37.1 C)   SpO2: 97% 96%    CONSTITUTIONAL: Well-appearing, NAD NEURO/PSYCH:  Alert and oriented x 3, no focal deficits EYES:  eyes equal and reactive ENT/NECK:  no LAD, no JVD CARDIO: Regular rate, well-perfused, normal S1 and S2 PULM:  CTAB no wheezing or rhonchi GI/GU:  non-distended, non-tender MSK/SPINE:  No gross deformities, no edema SKIN:  no rash, atraumatic   *Additional and/or pertinent findings included in MDM below  Diagnostic and Interventional Summary    EKG Interpretation  Date/Time:  Friday December 25 2022 21:15:07 EDT Ventricular Rate:  88 PR Interval:  172 QRS Duration:  74 QT Interval:  342 QTC Calculation: 413 R Axis:   56 Text Interpretation: Normal sinus rhythm Normal ECG When compared with ECG of 04-Oct-2020 19:17, PREVIOUS ECG IS PRESENT Confirmed by Kennis Carina [id number]) on 12/25/2022 10:55:32 PM       Labs Reviewed  BASIC METABOLIC PANEL - Abnormal; Notable for the following components:      Result Value   Glucose, Bld 138 (*)    Calcium 8.7 (*)    All other components within normal limits  CBC - Abnormal; Notable for the following components:   Hemoglobin 9.4 (*)    HCT 33.5 (*)    MCV 69.9 (*)    MCH 19.6 (*)    MCHC 28.1 (*)    RDW 22.4 (*)    Platelets 456 (*)    All other components within normal limits  D-DIMER, QUANTITATIVE  POC URINE PREG, ED  TROPONIN I (HIGH SENSITIVITY)  TROPONIN I (HIGH SENSITIVITY)    CT Angio Chest  Pulmonary Embolism (PE) W or WO Contrast  Final Result    DG Chest 2 View  Final Result      Medications  albuterol (VENTOLIN HFA) 108 (90 Base) MCG/ACT inhaler 2 puff (has no administration in time range)  ipratropium-albuterol (DUONEB) 0.5-2.5 (3) MG/3ML nebulizer solution 3 mL (3 mLs Nebulization Given 12/25/22 2329)  apixaban (ELIQUIS) tablet 5 mg (5 mg Oral Given 12/26/22 0041)  morphine (PF) 4 MG/ML injection 4 mg (4 mg Intravenous Given 12/25/22 2336)  albuterol (PROVENTIL) (2.5 MG/3ML) 0.083% nebulizer solution (2.5 mg  Given 12/25/22 2332)  iohexol (OMNIPAQUE) 350 MG/ML injection 75 mL (75 mLs Intravenous Contrast Given 12/26/22 0020)     Procedures  /  Critical Care Procedures  ED Course and Medical Decision Making  Initial Impression and Ddx Differential diagnosis includes PE given the medication noncompliance and history of PE.  ACS felt to be less likely.  Dissection, pneumothorax felt to be unlikely.  MSK, asthma, pleurisy all possible.  Past medical/surgical history that increases complexity of ED encounter: History of VTE  Interpretation of Diagnostics I personally reviewed the EKG and my interpretation is as follows: Sinus rhythm without significant change from prior  Labs overall reassuring with no significant blood count or electrolyte disturbance, troponin negative x 2.  CT is without evidence of PE.  Patient Reassessment and Ultimate Disposition/Management     Patient resting comfortably with normal vital signs on reassessment, appropriate for discharge.  Patient management required discussion with the following services or consulting groups:  None  Complexity of Problems Addressed Acute illness or injury that poses threat of life of bodily function  Additional Data Reviewed and Analyzed Further history obtained from: Further history from spouse/family member  Additional Factors Impacting ED Encounter Risk Prescriptions  Elmer Sow. Pilar Plate, MD [facility]  Emergency Medicine [facility] Health [email or web address]  Final Clinical Impressions(s) / ED Diagnoses     ICD-10-CM   1. Chest pain, unspecified type  R07.9       ED Discharge Orders          Ordered    albuterol (VENTOLIN HFA) 108 (90 Base) MCG/ACT inhaler  Every 4 hours PRN        12/26/22 0211    oxyCODONE (ROXICODONE) 5 MG immediate release tablet  Every 4 hours PRN        12/26/22 0211    lidocaine (LIDODERM) 5 %  Every 24 hours        12/26/22 0211  apixaban (ELIQUIS) 5 MG TABS tablet  2 times daily        12/26/22 0211             Discharge Instructions Discussed with and Provided to Patient:     Discharge Instructions      You were evaluated in the Emergency Department and after careful evaluation, we did not find any emergent condition requiring admission or further testing in the hospital.  Your exam/testing today is overall reassuring.  Symptoms may be due to pleurisy use the Lidoderm numbing patches daily to help with the pain.  Use Tylenol 1000 mg every 4-6 hours.  Use the oxycodone as needed for more significant pain but use caution as this medication can cause drowsiness.  Important to take your Eliquis 1 pill twice a day.  Please return to the Emergency Department if you experience any worsening of your condition.   Thank you for allowing [place] to be a part of your care.       Sabas Sous, MD 12/26/22 [phone]

## 2022-12-25 NOTE — ED Triage Notes (Addendum)
Pt presents with non-radiating, sharp CP with ShOB and mild dry cough that started yesterday. Denies fever. Pt has hx of DVTs and PEs. Pt takes Elloquis BID, but ran out last night and has not taken dose this AM.

## 2022-12-26 MED ORDER — OXYCODONE HCL 5 MG PO TABS: 5.0000 mg | ORAL_TABLET | ORAL | 0 refills | Status: DC | PRN

## 2022-12-26 MED ORDER — OXYCODONE-ACETAMINOPHEN 5-325 MG PO TABS
1.0000 | ORAL_TABLET | Freq: Once | ORAL | Status: AC
  Administered 2022-12-26: 1 via ORAL
  Filled 2022-12-26: qty 1

## 2022-12-26 MED ORDER — APIXABAN 5 MG PO TABS: 5.0000 mg | ORAL_TABLET | Freq: Two times a day (BID) | ORAL | 1 refills | Status: DC

## 2022-12-26 MED ORDER — LIDOCAINE 5 % EX PTCH: 1.0000 | MEDICATED_PATCH | CUTANEOUS | 0 refills | Status: DC

## 2022-12-26 MED ORDER — ALBUTEROL SULFATE HFA 108 (90 BASE) MCG/ACT IN AERS
2.0000 | INHALATION_SPRAY | Freq: Once | RESPIRATORY_TRACT | Status: AC
  Administered 2022-12-26: 2 via RESPIRATORY_TRACT
  Filled 2022-12-26: qty 6.7

## 2022-12-26 MED ORDER — ALBUTEROL SULFATE HFA 108 (90 BASE) MCG/ACT IN AERS: 2.0000 | INHALATION_SPRAY | RESPIRATORY_TRACT | 1 refills | Status: AC | PRN

## 2022-12-26 MED ORDER — IOHEXOL 350 MG/ML SOLN
75.0000 mL | Freq: Once | INTRAVENOUS | Status: AC | PRN
  Administered 2022-12-26: 75 mL via INTRAVENOUS

## 2022-12-26 NOTE — Discharge Instructions (Signed)
You were evaluated in the Emergency Department and after careful evaluation, we did not find any emergent condition requiring admission or further testing in the hospital.  Your exam/testing today is overall reassuring.  Symptoms may be due to pleurisy use the Lidoderm numbing patches daily to help with the pain.  Use Tylenol 1000 mg every 4-6 hours.  Use the oxycodone as needed for more significant pain but use caution as this medication can cause drowsiness.  Important to take your Eliquis 1 pill twice a day.  Please return to the Emergency Department if you experience any worsening of your condition.   Thank you for allowing [place] to be a part of your care.

## 2022-12-29 ENCOUNTER — Emergency Department (HOSPITAL_COMMUNITY)
Admission: EM | Admit: 2022-12-29 | Discharge: 2022-12-29 | Disposition: A | Discharge status: Home / Self Care | Payer: Medicaid Other | Attending: Emergency Medicine | Admitting: Emergency Medicine

## 2022-12-29 ENCOUNTER — Other Ambulatory Visit: Payer: Self-pay

## 2022-12-29 ENCOUNTER — Encounter (HOSPITAL_COMMUNITY): Payer: Self-pay

## 2022-12-29 DIAGNOSIS — R079 Chest pain, unspecified: Secondary | ICD-10-CM

## 2022-12-29 DIAGNOSIS — I4891 Unspecified atrial fibrillation: Secondary | ICD-10-CM | POA: Diagnosis not present

## 2022-12-29 DIAGNOSIS — Z7951 Long term (current) use of inhaled steroids: Secondary | ICD-10-CM | POA: Diagnosis not present

## 2022-12-29 DIAGNOSIS — Z7901 Long term (current) use of anticoagulants: Secondary | ICD-10-CM | POA: Diagnosis not present

## 2022-12-29 DIAGNOSIS — J45909 Unspecified asthma, uncomplicated: Secondary | ICD-10-CM | POA: Diagnosis not present

## 2022-12-29 LAB — CBG MONITORING, ED: Glucose-Capillary: 145 mg/dL — ABNORMAL HIGH (ref 70–99)

## 2022-12-29 MED ORDER — ACETAMINOPHEN 500 MG PO TABS: 1000.0000 mg | ORAL_TABLET | Freq: Four times a day (QID) | ORAL | 0 refills | Status: AC | PRN

## 2022-12-29 MED ORDER — CYCLOBENZAPRINE HCL 10 MG PO TABS: 10.0000 mg | ORAL_TABLET | Freq: Every day | ORAL | 0 refills | Status: DC

## 2022-12-29 MED ORDER — OXYCODONE HCL 5 MG PO TABS: 5.0000 mg | ORAL_TABLET | Freq: Four times a day (QID) | ORAL | 0 refills | Status: AC | PRN

## 2022-12-29 NOTE — ED Triage Notes (Signed)
Pt comes in with complaints of CP since Friday. Pt was seen recently in the ED and states that nothing was found but pain continues to get worse.

## 2022-12-29 NOTE — Discharge Instructions (Signed)
Thank you for allowing me to be a part of you care today.   I have sent over a muscle relaxer and pain medicine to your pharmacy.  Only take the pain medicine as needed for severe pain that is not relieved with Tylenol.  I have sent over a new prescription for Tylenol.  You may take 1000 mg every 6 hours as needed (this is a higher dose than what you were taking).  Take the muscle relaxant at night time.   I recommend scheduling a follow up appointment with your primary care provider.  Check your medicaid card to see if one is assigned to you.  You may also utilize the [facility] phone number to see if they are able to help you schedule an appointment with primary care provider.   I also recommend trying Voltaren (diclofenac) gel to your chest as needed to help with pain.  This can be purchased over the counter.   Please keep your scheduled appointment with your cardiologist.   Return to the ED if you experience a sudden worsening of your symptoms or if you have any new concerns.

## 2022-12-29 NOTE — ED Provider Notes (Signed)
[place] EMERGENCY DEPARTMENT AT [facility] Provider Note   CSN: [id number] Arrival date & time: 12/29/22  1511     History  Chief Complaint  Patient presents with   Chest Pain    Donna Douglas is a 42 y.o. 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  Patient states it improved some with the medications she was prescribed, but now has a "soreness" in her chest that remains.  She did restart her Eliquis.  She has cardiology follow-up appointment scheduled for next month.  Denies fever, cough, leg swelling, palpitations, nausea, vomiting, syncope.         Home Medications Prior to Admission medications   Medication Sig Start Date End Date Taking? Authorizing Provider  cyclobenzaprine (FLEXERIL) 10 MG tablet Take 1 tablet (10 mg total) by mouth at bedtime. 12/29/22  Yes Angell Pincock R, PA-C  acetaminophen (TYLENOL) 500 MG tablet Take 2 tablets (1,000 mg total) by mouth every 6 (six) hours as needed for mild pain (or Fever >/= 101). 12/29/22   Detron Carras R, PA-C  albuterol (VENTOLIN HFA) 108 (90 Base) MCG/ACT inhaler Inhale 2 puffs into the lungs every 6 (six) hours as needed for wheezing or shortness of breath. 10/06/20   Johnson, Clanford L, MD  albuterol (VENTOLIN HFA) 108 (90 Base) MCG/ACT inhaler Inhale 2 puffs into the lungs every 4 (four) hours as needed for wheezing or shortness of breath. 12/26/22   Bero, Elmer Sow, MD  apixaban (ELIQUIS) 5 MG TABS tablet Take 1 tablet (5 mg total) by mouth 2 (two) times daily. 12/26/22   Sabas Sous, MD  APIXABAN Everlene Balls) VTE STARTER PACK (10MG  AND 5MG ) Take as directed on package: start with two-5mg  tablets twice daily for 7 days. On day 8, switch to one-5mg  tablet twice daily. 10/06/20   Cleora Fleet, MD  blood glucose meter kit and supplies Dispense based on patient and insurance  preference. Use up to four times daily as directed. (FOR ICD-10 E10.9, E11.9). 10/06/20   Johnson, Clanford L, MD  ibuprofen (ADVIL) 800 MG tablet Take 1 tablet (800 mg total) by mouth every 8 (eight) hours as needed for moderate pain. 10/06/20   Johnson, Clanford L, MD  ipratropium-albuterol (DUONEB) 0.5-2.5 (3) MG/3ML SOLN Take 3 mLs by nebulization daily. 09/11/20   [provider]  lidocaine (LIDODERM) 5 % Place 1 patch onto the skin daily. Remove & Discard patch within 12 hours or as directed by MD 12/26/22   Sabas Sous, MD  Meclizine HCl 25 MG CHEW Chew 1 tablet by mouth daily as needed for dizziness. 10/08/19   [provider]  metFORMIN (GLUCOPHAGE XR) 500 MG 24 hr tablet Take 1 daily with supper x 5 days, then 1 po BID with meals 10/06/20   Johnson, Clanford L, MD  nicotine (NICODERM CQ - DOSED IN MG/24 HOURS) 21 mg/24hr patch Place 1 patch (21 mg total) onto the skin daily. 10/07/20   Johnson, Clanford L, MD  oxyCODONE (ROXICODONE) 5 MG immediate release tablet Take 1 tablet (5 mg total) by mouth every 6 (six) hours as needed for severe pain. 12/29/22   Derrian Rodak R, PA-C  phenazopyridine (PYRIDIUM) 200 MG tablet Take 1 tablet (200 mg total) by mouth 3 (three) times daily as needed for pain. 07/18/22   Sabas Sous, MD  polyethylene glycol (MIRALAX) 17 g packet  Take 17 g by mouth daily. 07/18/22   Sabas Sous, MD  traZODone (DESYREL) 50 MG tablet Take 1 tablet (50 mg total) by mouth at bedtime as needed for sleep. 10/06/20   Cleora Fleet, MD      Allergies    Patient has no known allergies.    Review of Systems   Review of Systems  Constitutional:  Negative for fever.  Respiratory:  Positive for shortness of breath (occasional). Negative for cough.   Cardiovascular:  Positive for chest pain. Negative for palpitations and leg swelling.  Gastrointestinal:  Negative for nausea and vomiting.  Neurological:  Negative for syncope.    Physical Exam Updated Vital  Signs BP 136/80   Pulse 88   Temp 98.9 F (37.2 C) (Oral)   Resp 16   Ht 5\' 5"  (1.651 m)   Wt 115.2 kg   LMP 11/30/2022 (Approximate)   SpO2 97%   BMI 42.26 kg/m  Physical Exam Vitals and nursing note reviewed.  Constitutional:      General: She is not in acute distress.    Appearance: Normal appearance. She is not ill-appearing or diaphoretic.  Cardiovascular:     Rate and Rhythm: Normal rate and regular rhythm.     Heart sounds: Normal heart sounds.  Pulmonary:     Effort: Pulmonary effort is normal.  Chest:     Chest wall: Tenderness present. No deformity.    Skin:    General: Skin is warm and dry.     Capillary Refill: Capillary refill takes less than 2 seconds.  Neurological:     Mental Status: She is alert. Mental status is at baseline.  Psychiatric:        Mood and Affect: Mood normal.        Behavior: Behavior normal.     ED Results / Procedures / Treatments   Labs (all labs ordered are listed, but only abnormal results are displayed) Labs Reviewed  CBG MONITORING, ED - Abnormal; Notable for the following components:      Result Value   Glucose-Capillary 145 (*)    All other components within normal limits    EKG None  Radiology No results found.  Procedures Procedures    Medications Ordered in ED Medications - No data to display  ED Course/ Medical Decision Making/ A&P                             Medical Decision Making Risk OTC drugs. Prescription drug management.   Patient returns to the ED complaining of continued chest pain that has not completely resolved.  She reports that the medication she was prescribed on Friday did help with her symptoms, but she is still having intermittent sharp pains with associated shortness of breath.  She is not sure if this is related to stress.  She has scheduled a follow-up appointment with cardiology.   Differential diagnosis includes:  costochondritis, chest wall strain; low suspicion for PE or ACS  due to patient's normal work-up on Friday and anticoagulation with Eliquis.    Exam significant for tenderness to palpation of left anterior chest wall just lateral to the sternum.  No chest wall instability.  Lungs clear to auscultation bilaterally.  She speaks in full sentences and is not in respiratory distress.  Heart rate is normal with regular rhythm.  No appreciable murmurs.   Patient appears overall well and has not had worsening of her chest  pain symptoms since Friday.  She does report improvement.  Chest pain is reproducible with palpation.  Suspect this may be musculoskeletal.  Patient has good cardiology outpatient follow-up.  She has returned to compliance with her Eliquis since getting new prescription.  I do not feel that patient requires additional work-up at this time.    Will give patient muscle relaxant and few more doses of pain medication.  Advised patient she will need to schedule follow-up with primary care provider as well.  The patient has been appropriately medically screened and/or stabilized in the ED. I have low suspicion for any other emergent medical condition which would require further screening, evaluation or treatment in the ED or require inpatient management. At time of discharge the patient is hemodynamically stable and in no acute distress. I have discussed work-up results and diagnosis with patient and answered all questions. Patient is agreeable with discharge plan. We discussed strict return precautions for returning to the emergency department and they verbalized understanding.          Final Clinical Impression(s) / ED Diagnoses Final diagnoses:  Chest pain, unspecified type    Rx / DC Orders ED Discharge Orders          Ordered    oxyCODONE (ROXICODONE) 5 MG immediate release tablet  Every 6 hours PRN        12/29/22 1716    cyclobenzaprine (FLEXERIL) 10 MG tablet  Daily at bedtime        12/29/22 1716    acetaminophen (TYLENOL) 500 MG tablet   Every 6 hours PRN        12/29/22 1716              Lenard Simmer, PA-C 12/29/22 1731    Eber Hong, MD 12/31/22 [phone]

## 2022-12-29 NOTE — ED Notes (Signed)
Dc instructions and scripts reviewed with pt no questions or concerns at this time. Will follow up with pcp.  

## 2023-01-16 ENCOUNTER — Emergency Department (HOSPITAL_COMMUNITY): Payer: Medicaid Other

## 2023-01-16 ENCOUNTER — Emergency Department (HOSPITAL_COMMUNITY)
Admission: EM | Admit: 2023-01-16 | Discharge: 2023-01-16 | Disposition: A | Discharge status: Home / Self Care | Payer: Medicaid Other | Attending: Emergency Medicine | Admitting: Emergency Medicine

## 2023-01-16 ENCOUNTER — Encounter (HOSPITAL_COMMUNITY): Payer: Self-pay

## 2023-01-16 DIAGNOSIS — Z7901 Long term (current) use of anticoagulants: Secondary | ICD-10-CM | POA: Insufficient documentation

## 2023-01-16 DIAGNOSIS — R079 Chest pain, unspecified: Secondary | ICD-10-CM

## 2023-01-16 DIAGNOSIS — E119 Type 2 diabetes mellitus without complications: Secondary | ICD-10-CM | POA: Insufficient documentation

## 2023-01-16 DIAGNOSIS — J45909 Unspecified asthma, uncomplicated: Secondary | ICD-10-CM | POA: Insufficient documentation

## 2023-01-16 DIAGNOSIS — R0602 Shortness of breath: Secondary | ICD-10-CM | POA: Diagnosis not present

## 2023-01-16 DIAGNOSIS — Z7984 Long term (current) use of oral hypoglycemic drugs: Secondary | ICD-10-CM | POA: Diagnosis not present

## 2023-01-16 LAB — BASIC METABOLIC PANEL
Anion gap: 9 (ref 5–15)
BUN: 13 mg/dL (ref 6–20)
CO2: 23 mmol/L (ref 22–32)
Calcium: 8.8 mg/dL — ABNORMAL LOW (ref 8.9–10.3)
Chloride: 103 mmol/L (ref 98–111)
Creatinine, Ser: 0.81 mg/dL (ref 0.44–1.00)
GFR, Estimated: >60 mL/min (ref >60)
Glucose, Bld: 118 mg/dL — ABNORMAL HIGH (ref 70–99)
Potassium: 3.7 mmol/L (ref 3.5–5.1)
Sodium: 135 mmol/L (ref 135–145)

## 2023-01-16 LAB — CBC
HCT: 31.8 % — ABNORMAL LOW (ref 36.0–46.0)
Hemoglobin: 9.3 g/dL — ABNORMAL LOW (ref 12.0–15.0)
MCH: 20 pg — ABNORMAL LOW (ref 26.0–34.0)
MCHC: 29.2 g/dL — ABNORMAL LOW (ref 30.0–36.0)
MCV: 68.5 fL — ABNORMAL LOW (ref 80.0–100.0)
Platelets: 502 10*3/uL — ABNORMAL HIGH (ref 150–400)
RBC: 4.64 MIL/uL (ref 3.87–5.11)
RDW: 21.1 % — ABNORMAL HIGH (ref 11.5–15.5)
WBC: 8.9 10*3/uL (ref 4.0–10.5)
nRBC: 0 % (ref 0.0–0.2)

## 2023-01-16 LAB — TROPONIN I (HIGH SENSITIVITY)
Troponin I (High Sensitivity): 4 ng/L (ref <18)
Troponin I (High Sensitivity): 3 ng/L (ref <18)

## 2023-01-16 LAB — BRAIN NATRIURETIC PEPTIDE: B Natriuretic Peptide: 20 pg/mL (ref 0.0–100.0)

## 2023-01-16 MED ORDER — LORAZEPAM 0.5 MG PO TABS
0.5000 mg | ORAL_TABLET | Freq: Once | ORAL | Status: AC
  Administered 2023-01-16: 0.5 mg via ORAL
  Filled 2023-01-16: qty 1

## 2023-01-16 MED ORDER — IOHEXOL 350 MG/ML SOLN
75.0000 mL | Freq: Once | INTRAVENOUS | Status: AC | PRN
  Administered 2023-01-16: 75 mL via INTRAVENOUS

## 2023-01-16 MED ORDER — CYCLOBENZAPRINE HCL 10 MG PO TABS
10.0000 mg | ORAL_TABLET | Freq: Once | ORAL | Status: AC
  Administered 2023-01-16: 10 mg via ORAL
  Filled 2023-01-16: qty 1

## 2023-01-16 MED ORDER — IBUPROFEN 800 MG PO TABS
800.0000 mg | ORAL_TABLET | Freq: Once | ORAL | Status: DC
  Filled 2023-01-16: qty 1

## 2023-01-16 MED ORDER — CYCLOBENZAPRINE HCL 10 MG PO TABS: 5.0000 mg | ORAL_TABLET | Freq: Once | ORAL | Status: DC

## 2023-01-16 MED ORDER — CYCLOBENZAPRINE HCL 10 MG PO TABS: 10.0000 mg | ORAL_TABLET | Freq: Two times a day (BID) | ORAL | 0 refills | Status: DC | PRN

## 2023-01-16 NOTE — ED Provider Notes (Addendum)
[place] EMERGENCY DEPARTMENT AT [facility] Provider Note   CSN: [id number] Arrival date & time: 01/16/23  1017     History  Chief Complaint  Patient presents with   Chest Pain   HPI Donna Douglas is a 42 y.o. 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  Started late last night.  It is midsternal and feels like stabbing.  Is nonradiating.  Worse with exertion and deep breaths.  Also associates shortness of breath.  It is reproducible.  States she is compliant in taking her Eliquis.  Denies calf tenderness.  Denies OCP use.  States she has been under "a lot of stress" here lately.  States that "life is just really hard right now".  Denies cough congestion fever.   Chest Pain      Home Medications Prior to Admission medications   Medication Sig Start Date End Date Taking? Authorizing Provider  cyclobenzaprine (FLEXERIL) 10 MG tablet Take 1 tablet (10 mg total) by mouth 2 (two) times daily as needed for muscle spasms. 01/16/23  Yes Gareth Eagle, PA-C  acetaminophen (TYLENOL) 500 MG tablet Take 2 tablets (1,000 mg total) by mouth every 6 (six) hours as needed for mild pain (or Fever >/= 101). 12/29/22   Clark, Meghan R, PA-C  albuterol (VENTOLIN HFA) 108 (90 Base) MCG/ACT inhaler Inhale 2 puffs into the lungs every 6 (six) hours as needed for wheezing or shortness of breath. 10/06/20   Johnson, Clanford L, MD  albuterol (VENTOLIN HFA) 108 (90 Base) MCG/ACT inhaler Inhale 2 puffs into the lungs every 4 (four) hours as needed for wheezing or shortness of breath. 12/26/22   Bero, Elmer Sow, MD  apixaban (ELIQUIS) 5 MG TABS tablet Take 1 tablet (5 mg total) by mouth 2 (two) times daily. 12/26/22   Sabas Sous, MD  APIXABAN Everlene Balls) VTE STARTER PACK (10MG  AND 5MG ) Take as directed on package: start with two-5mg  tablets twice daily for 7 days. On day 8, switch to one-5mg  tablet twice daily. 10/06/20   Cleora Fleet, MD  blood glucose meter kit and supplies Dispense based on patient and insurance preference. Use up to four times daily as directed. (FOR ICD-10 E10.9, E11.9). 10/06/20   Johnson, Clanford L, MD  ibuprofen (ADVIL) 800 MG tablet Take 1 tablet (800 mg total) by mouth every 8 (eight) hours as needed for moderate pain. 10/06/20   Johnson, Clanford L, MD  ipratropium-albuterol (DUONEB) 0.5-2.5 (3) MG/3ML SOLN Take 3 mLs by nebulization daily. 09/11/20   [provider]  lidocaine (LIDODERM) 5 % Place 1 patch onto the skin daily. Remove & Discard patch within 12 hours or as directed by MD 12/26/22   Sabas Sous, MD  Meclizine HCl 25 MG CHEW Chew 1 tablet by mouth daily as needed for dizziness. 10/08/19   [provider]  metFORMIN (GLUCOPHAGE XR) 500 MG 24 hr tablet Take 1 daily with supper x 5 days, then 1 po BID with meals 10/06/20   Johnson, Clanford L, MD  nicotine (NICODERM CQ - DOSED IN MG/24 HOURS) 21 mg/24hr patch Place 1 patch (21 mg total) onto the skin daily. 10/07/20   Johnson, Clanford L, MD  oxyCODONE (ROXICODONE) 5 MG immediate release tablet Take 1 tablet (5 mg total) by mouth every 6 (six) hours as needed for severe pain. 12/29/22   Clark, Meghan R, PA-C  phenazopyridine (PYRIDIUM) 200 MG tablet Take 1 tablet (200  mg total) by mouth 3 (three) times daily as needed for pain. 07/18/22   Sabas Sous, MD  polyethylene glycol (MIRALAX) 17 g packet Take 17 g by mouth daily. 07/18/22   Sabas Sous, MD  traZODone (DESYREL) 50 MG tablet Take 1 tablet (50 mg total) by mouth at bedtime as needed for sleep. 10/06/20   Cleora Fleet, MD      Allergies    Patient has no known allergies.    Review of Systems   Review of Systems  Cardiovascular:  Positive for chest pain.    Physical Exam   Vitals:   01/16/23 1402 01/16/23 1405  BP: (!) 144/90   Pulse: 95 89  Resp: 19 20  Temp:  98.2 F (36.8 C)  SpO2: 99% 93%    CONSTITUTIONAL:  well-appearing, NAD NEURO:   Alert and oriented x 3, CN 3-12 grossly intact EYES:  eyes equal and reactive ENT/NECK:  Supple, no stridor  CHEST: mid sternal TTP CARDIO:  tachycardic and regular rhythm, appears well-perfused  PULM:  No respiratory distress, CTAB GI/GU:  non-distended, soft MSK/SPINE:  No gross deformities, no edema, moves all extremities  SKIN:  no rash, atraumatic   *Additional and/or pertinent findings included in MDM below    ED Results / Procedures / Treatments   Labs (all labs ordered are listed, but only abnormal results are displayed) Labs Reviewed  BASIC METABOLIC PANEL - Abnormal; Notable for the following components:      Result Value   Glucose, Bld 118 (*)    Calcium 8.8 (*)    All other components within normal limits  CBC - Abnormal; Notable for the following components:   Hemoglobin 9.3 (*)    HCT 31.8 (*)    MCV 68.5 (*)    MCH 20.0 (*)    MCHC 29.2 (*)    RDW 21.1 (*)    Platelets 502 (*)    All other components within normal limits  BRAIN NATRIURETIC PEPTIDE  PREGNANCY, URINE  URINALYSIS, ROUTINE W REFLEX MICROSCOPIC  TROPONIN I (HIGH SENSITIVITY)  TROPONIN I (HIGH SENSITIVITY)    EKG EKG Interpretation  Date/Time:  Saturday Jan 16 2023 10:35:21 EDT Ventricular Rate:  96 PR Interval:  168 QRS Duration: 80 QT Interval:  359 QTC Calculation: 454 R Axis:   53 Text Interpretation: Sinus rhythm No significant change since prior 4/24 Confirmed by Meridee Score [phone]) on 01/16/2023 10:45:51 AM  Radiology CT Angio Chest PE W/Cm &/Or Wo Cm  Result Date: 01/16/2023 CLINICAL DATA:  History of pulmonary emboli with 1 day history of central chest pain EXAM: CT ANGIOGRAPHY CHEST WITH CONTRAST TECHNIQUE: Multidetector CT imaging of the chest was performed using the standard protocol during bolus administration of intravenous contrast. Multiplanar CT image reconstructions and MIPs were obtained to evaluate the vascular anatomy. RADIATION DOSE REDUCTION: This exam was  performed according to the departmental dose-optimization program which includes automated exposure control, adjustment of the mA and/or kV according to patient size and/or use of iterative reconstruction technique. CONTRAST:  75mL OMNIPAQUE IOHEXOL 350 MG/ML SOLN COMPARISON:  CTA chest dated 12/26/2022, 01/05/2016 FINDINGS: Cardiovascular: The study is satisfactory quality for the evaluation of pulmonary embolism. There are no filling defects in the central, lobar, segmental or subsegmental pulmonary artery branches to suggest acute pulmonary embolism. Great vessels are normal in course and caliber. Normal heart size. No significant pericardial fluid/thickening. Mediastinum/Nodes: Imaged thyroid gland without nodules meeting criteria for imaging follow-up by size. Normal esophagus. No  pathologically enlarged axillary, supraclavicular, mediastinal, or hilar lymph nodes. Lungs/Pleura: The central airways are patent. Increased mild diffuse bronchial wall thickening. Increased conspicuity of mosaic attenuation. No focal consolidations. No pneumothorax. No pleural effusion. Upper abdomen: Subcentimeter hypoattenuating focus within hepatic segment 5/6 (5:81) unchanged dating back to 01/05/2016, likely benign cyst or hemangioma. Musculoskeletal: No acute or abnormal lytic or blastic osseous lesions. Review of the MIP images confirms the above findings. IMPRESSION: 1. No evidence of pulmonary embolism. 2. Increased mild diffuse bronchial wall thickening and mosaic attenuation, which can be seen in the setting of small airways disease. Electronically Signed   By: Agustin Cree M.D.   On: 01/16/2023 15:06   DG Chest Port 1 View  Result Date: 01/16/2023 CLINICAL DATA:  Chest pain EXAM: PORTABLE CHEST 1 VIEW COMPARISON:  12/25/2022 FINDINGS: The heart size and mediastinal contours are within normal limits. No focal airspace consolidation, pleural effusion, or pneumothorax. The visualized skeletal structures are unremarkable.  IMPRESSION: No active disease. Electronically Signed   By: Duanne Guess D.O.   On: 01/16/2023 11:06    Procedures Procedures    Medications Ordered in ED Medications  ibuprofen (ADVIL) tablet 800 mg (800 mg Oral Not Given 01/16/23 1446)  cyclobenzaprine (FLEXERIL) tablet 10 mg (has no administration in time range)  LORazepam (ATIVAN) tablet 0.5 mg (0.5 mg Oral Given 01/16/23 1412)  iohexol (OMNIPAQUE) 350 MG/ML injection 75 mL (75 mLs Intravenous Contrast Given 01/16/23 1446)    ED Course/ Medical Decision Making/ A&P                             Medical Decision Making Amount and/or Complexity of Data Reviewed Labs: ordered. Radiology: ordered.  Risk Prescription drug management.   Initial Impression and Ddx 42 year old female is well-appearing presenting with chest pain.  Exam was notable for midsternal tenderness to palpation.  DDx includes PE, ACS, pneumothorax, pneumonia, aortic dissection. 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 Ativan.  ACS workup was unremarkable.  Patient did endorse improved but still present chest pain on reassessment which prompted further evaluation with CT of her chest given her extensive VTE history.  Fortunately CT was negative.  Given the chest pain is reproducible, sharp and nonradiating it is likely costochondritis.  Patient also recently  evaluated couple weeks ago for this same complaint and was found to likely have costochondritis at that time as well.  Also with negative PE study at that time as well.  Advised to follow-up with her PCP.  Vitals stable at discharge.  Sent Flexeril to her pharmacy.  Patient states she does not have any more at home.  Patient management required discussion with the following services or consulting groups:  None  Complexity of Problems Addressed Acute complicated illness or Injury  Additional Data Reviewed and Analyzed Further history obtained from: Further history from spouse/family member, Past medical history and medications listed in the EMR, and Prior ED visit notes  Patient Encounter Risk Assessment None    Final Clinical Impression(s) / ED Diagnoses Final diagnoses:  Chest pain, unspecified type    Rx / DC Orders ED Discharge Orders          Ordered    cyclobenzaprine (FLEXERIL) 10 MG tablet  2 times daily PRN        01/16/23 1527               Gareth Eagle, PA-C 01/16/23 1528    Terrilee Files, MD 01/16/23 1736

## 2023-01-16 NOTE — ED Triage Notes (Addendum)
Pt arrives via POV with c/o central chest pain that started last night. Per pt she has a hx of PE and DVT, currently on Eliquis. She describes it as constant and sharp, and worse when she breaths in. Rates severity of pain 10/10. She states she took Tylenol and Ibuprofen for the pain without relief.

## 2023-01-16 NOTE — Discharge Instructions (Signed)
Evaluation for your chest pain was overall reassuring.  EKG, labs x-ray and CT scan were all within normal limits.  Suspect your chest pain is likely costochondritis given that is reproducible sharp pain in the middle of your chest.  Nothing to do for at this time.  You can take Tylenol or Profen at home for your pain.  Otherwise recommend you follow-up with your PCP.  If you start to experience worsening chest pain, shortness of breath, calf tenderness or any other concerning symptom please return emerged part for further evaluation.

## 2023-01-16 NOTE — ED Notes (Signed)
Provider notified of pt's pain.

## 2023-05-23 ENCOUNTER — Other Ambulatory Visit: Payer: Self-pay

## 2023-05-23 ENCOUNTER — Encounter (HOSPITAL_COMMUNITY): Payer: Self-pay | Admitting: *Deleted

## 2023-05-23 ENCOUNTER — Emergency Department (HOSPITAL_COMMUNITY)
Admission: EM | Admit: 2023-05-23 | Discharge: 2023-05-23 | Disposition: A | Discharge status: Home / Self Care | Payer: Medicaid Other | Attending: Emergency Medicine | Admitting: Emergency Medicine

## 2023-05-23 ENCOUNTER — Emergency Department (HOSPITAL_COMMUNITY): Payer: Medicaid Other

## 2023-05-23 DIAGNOSIS — Z7901 Long term (current) use of anticoagulants: Secondary | ICD-10-CM | POA: Diagnosis not present

## 2023-05-23 DIAGNOSIS — R0789 Other chest pain: Secondary | ICD-10-CM | POA: Diagnosis not present

## 2023-05-23 DIAGNOSIS — R079 Chest pain, unspecified: Secondary | ICD-10-CM | POA: Diagnosis present

## 2023-05-23 LAB — BASIC METABOLIC PANEL
Anion gap: 7 (ref 5–15)
BUN: 14 mg/dL (ref 6–20)
CO2: 28 mmol/L (ref 22–32)
Calcium: 8.6 mg/dL — ABNORMAL LOW (ref 8.9–10.3)
Chloride: 101 mmol/L (ref 98–111)
Creatinine, Ser: 0.9 mg/dL (ref 0.44–1.00)
GFR, Estimated: >60 mL/min (ref >60)
Glucose, Bld: 152 mg/dL — ABNORMAL HIGH (ref 70–99)
Potassium: 3.4 mmol/L — ABNORMAL LOW (ref 3.5–5.1)
Sodium: 136 mmol/L (ref 135–145)

## 2023-05-23 LAB — CBC WITH DIFFERENTIAL/PLATELET
Abs Immature Granulocytes: 0.02 10*3/uL (ref 0.00–0.07)
Basophils Absolute: 0 10*3/uL (ref 0.0–0.1)
Basophils Relative: 0 %
Eosinophils Absolute: 0.1 10*3/uL (ref 0.0–0.5)
Eosinophils Relative: 1 %
HCT: 32 % — ABNORMAL LOW (ref 36.0–46.0)
Hemoglobin: 9.3 g/dL — ABNORMAL LOW (ref 12.0–15.0)
Immature Granulocytes: 0 %
Lymphocytes Relative: 24 %
Lymphs Abs: 1.9 10*3/uL (ref 0.7–4.0)
MCH: 20.4 pg — ABNORMAL LOW (ref 26.0–34.0)
MCHC: 29.1 g/dL — ABNORMAL LOW (ref 30.0–36.0)
MCV: 70.2 fL — ABNORMAL LOW (ref 80.0–100.0)
Monocytes Absolute: 0.6 10*3/uL (ref 0.1–1.0)
Monocytes Relative: 7 %
Neutro Abs: 5.3 10*3/uL (ref 1.7–7.7)
Neutrophils Relative %: 68 %
Platelets: 399 10*3/uL (ref 150–400)
RBC: 4.56 MIL/uL (ref 3.87–5.11)
RDW: 20.8 % — ABNORMAL HIGH (ref 11.5–15.5)
WBC: 7.9 10*3/uL (ref 4.0–10.5)
nRBC: 0 % (ref 0.0–0.2)

## 2023-05-23 LAB — TROPONIN I (HIGH SENSITIVITY)
Troponin I (High Sensitivity): 3 ng/L (ref <18)
Troponin I (High Sensitivity): 3 ng/L (ref <18)

## 2023-05-23 LAB — D-DIMER, QUANTITATIVE: D-Dimer, Quant: 0.4 ug/mL-FEU (ref 0.00–0.50)

## 2023-05-23 MED ORDER — ONDANSETRON HCL 4 MG/2ML IJ SOLN
4.0000 mg | Freq: Once | INTRAMUSCULAR | Status: AC
  Administered 2023-05-23: 4 mg via INTRAVENOUS
  Filled 2023-05-23: qty 2

## 2023-05-23 MED ORDER — HYDROMORPHONE HCL 1 MG/ML IJ SOLN
1.0000 mg | Freq: Once | INTRAMUSCULAR | Status: AC
  Administered 2023-05-23: 1 mg via INTRAVENOUS
  Filled 2023-05-23: qty 1

## 2023-05-23 MED ORDER — CYCLOBENZAPRINE HCL 10 MG PO TABS: 10.0000 mg | ORAL_TABLET | Freq: Three times a day (TID) | ORAL | 1 refills | Status: DC | PRN

## 2023-05-23 MED ORDER — APIXABAN 5 MG PO TABS: 5.0000 mg | ORAL_TABLET | Freq: Two times a day (BID) | ORAL | 1 refills | Status: DC

## 2023-05-23 NOTE — ED Notes (Signed)
Monitoring SPO2 prior to d/c per EDP & CN

## 2023-05-23 NOTE — ED Provider Notes (Addendum)
[place] EMERGENCY DEPARTMENT AT [facility] Provider Note   CSN: [id number] Arrival date & time: 05/23/23  1914     History  Chief Complaint  Patient presents with   Chest Pain    Donna Douglas is a 42 y.o. female.  Patient with complaint of left anterior chest pain for 2 days.  Patient 2 weeks ago had a pretty significant upper respiratory infection.  Was doing a lot of coughing but that is improved significantly.  Did not have chest pain at that time.  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  Patient stating that her Eliquis is about ready to run out.  We renewed it in April.  Patient has a lidocaine patch in place no relief with oxycodone which is medication that she is prescribed states is worse with movement improved when still.  Patient states is 10 out of 10 but she appears very comfortable.  Denies any fever nausea vomiting cough feeling like she is wheezing congestion no shortness of breath no dizziness.  Patient has not had a recent cardiology eval.  Patient evaluated in the emergency department May 18 for chest pain without acute findings.  Evaluated April 30 without acute findings.  Also evaluated April 26 for shortness of breath.  No acute findings at that time as well.       Home Medications Prior to Admission medications   Medication Sig Start Date End Date Taking? Authorizing Provider  acetaminophen (TYLENOL) 500 MG tablet Take 2 tablets (1,000 mg total) by mouth every 6 (six) hours as needed for mild pain (or Fever >/= 101). 12/29/22   Clark, Meghan R, PA-C  albuterol (VENTOLIN HFA) 108 (90 Base) MCG/ACT inhaler Inhale 2 puffs into the lungs every 6 (six) hours as needed for wheezing or shortness of breath. 10/06/20   Johnson, Clanford L, MD  albuterol (VENTOLIN HFA) 108 (90 Base) MCG/ACT inhaler Inhale 2 puffs into the lungs every 4 (four) hours as needed for wheezing or shortness of breath. 12/26/22    Bero, Elmer Sow, MD  apixaban (ELIQUIS) 5 MG TABS tablet Take 1 tablet (5 mg total) by mouth 2 (two) times daily. 12/26/22   Sabas Sous, MD  APIXABAN Everlene Balls) VTE STARTER PACK (10MG  AND 5MG ) Take as directed on package: start with two-5mg  tablets twice daily for 7 days. On day 8, switch to one-5mg  tablet twice daily. 10/06/20   Cleora Fleet, MD  blood glucose meter kit and supplies Dispense based on patient and insurance preference. Use up to four times daily as directed. (FOR ICD-10 E10.9, E11.9). 10/06/20   Johnson, Clanford L, MD  cyclobenzaprine (FLEXERIL) 10 MG tablet Take 1 tablet (10 mg total) by mouth 2 (two) times daily as needed for muscle spasms. 01/16/23   Gareth Eagle, PA-C  ibuprofen (ADVIL) 800 MG tablet Take 1 tablet (800 mg total) by mouth every 8 (eight) hours as needed for moderate pain. 10/06/20   Johnson, Clanford L, MD  ipratropium-albuterol (DUONEB) 0.5-2.5 (3) MG/3ML SOLN Take 3 mLs by nebulization daily. 09/11/20   [provider]  lidocaine (LIDODERM) 5 % Place 1 patch onto the skin daily. Remove & Discard patch within 12 hours or as directed by MD 12/26/22   Sabas Sous, MD  Meclizine HCl 25 MG CHEW Chew 1 tablet by mouth daily as needed for dizziness. 10/08/19   [provider]  metFORMIN (GLUCOPHAGE XR) 500 MG 24 hr  tablet Take 1 daily with supper x 5 days, then 1 po BID with meals 10/06/20   Johnson, Clanford L, MD  nicotine (NICODERM CQ - DOSED IN MG/24 HOURS) 21 mg/24hr patch Place 1 patch (21 mg total) onto the skin daily. 10/07/20   Johnson, Clanford L, MD  oxyCODONE (ROXICODONE) 5 MG immediate release tablet Take 1 tablet (5 mg total) by mouth every 6 (six) hours as needed for severe pain. 12/29/22   Clark, Meghan R, PA-C  phenazopyridine (PYRIDIUM) 200 MG tablet Take 1 tablet (200 mg total) by mouth 3 (three) times daily as needed for pain. 07/18/22   Sabas Sous, MD  polyethylene glycol (MIRALAX) 17 g packet Take 17 g by mouth daily.  07/18/22   Sabas Sous, MD  traZODone (DESYREL) 50 MG tablet Take 1 tablet (50 mg total) by mouth at bedtime as needed for sleep. 10/06/20   Cleora Fleet, MD      Allergies    Patient has no known allergies.    Review of Systems   Review of Systems  Constitutional:  Negative for chills and fever.  HENT:  Negative for ear pain and sore throat.   Eyes:  Negative for pain and visual disturbance.  Respiratory:  Negative for cough and shortness of breath.   Cardiovascular:  Positive for chest pain. Negative for palpitations.  Gastrointestinal:  Negative for abdominal pain and vomiting.  Genitourinary:  Negative for dysuria and hematuria.  Musculoskeletal:  Negative for arthralgias and back pain.  Skin:  Negative for color change and rash.  Neurological:  Negative for seizures and syncope.  All other systems reviewed and are negative.   Physical Exam Updated Vital Signs BP 124/70   Pulse 79   Temp 98.1 F (36.7 C) (Oral)   Resp 12   Ht 1.651 m (5\' 5" )   Wt 113.4 kg   SpO2 95%   BMI 41.60 kg/m  Physical Exam Vitals and nursing note reviewed.  Constitutional:      General: She is not in acute distress.    Appearance: Normal appearance. She is well-developed.  HENT:     Head: Normocephalic and atraumatic.  Eyes:     Conjunctiva/sclera: Conjunctivae normal.  Cardiovascular:     Rate and Rhythm: Normal rate and regular rhythm.     Heart sounds: No murmur heard. Pulmonary:     Effort: Pulmonary effort is normal. No respiratory distress.     Breath sounds: Normal breath sounds. No stridor. No wheezing, rhonchi or rales.  Chest:     Chest wall: No tenderness.  Abdominal:     Palpations: Abdomen is soft.     Tenderness: There is no abdominal tenderness.  Musculoskeletal:        General: No swelling.     Cervical back: Neck supple.  Skin:    General: Skin is warm and dry.     Capillary Refill: Capillary refill takes less than 2 seconds.  Neurological:      General: No focal deficit present.     Mental Status: She is alert and oriented to person, place, and time.  Psychiatric:        Mood and Affect: Mood normal.     ED Results / Procedures / Treatments   Labs (all labs ordered are listed, but only abnormal results are displayed) Labs Reviewed  CBC WITH DIFFERENTIAL/PLATELET - Abnormal; Notable for the following components:      Result Value   Hemoglobin 9.3 (*)  HCT 32.0 (*)    MCV 70.2 (*)    MCH 20.4 (*)    MCHC 29.1 (*)    RDW 20.8 (*)    All other components within normal limits  BASIC METABOLIC PANEL  D-DIMER, QUANTITATIVE  TROPONIN I (HIGH SENSITIVITY)    EKG EKG Interpretation Date/Time:  Sunday May 23 2023 07:14:38 EDT Ventricular Rate:  83 PR Interval:  164 QRS Duration:  98 QT Interval:  359 QTC Calculation: 422 R Axis:   70  Text Interpretation: Sinus rhythm Abnormal R-wave progression, early transition Confirmed by Vanetta Mulders [phone]) on 05/23/2023 7:16:28 AM  Radiology DG Chest Port 1 View  Result Date: 05/23/2023 CLINICAL DATA:  42 year old female with history of chest pain. EXAM: PORTABLE CHEST 1 VIEW COMPARISON:  Chest x-ray 01/16/2023. FINDINGS: Lung volumes are low. No consolidative airspace disease. No pleural effusions. No pneumothorax. No pulmonary nodule or mass noted. Pulmonary vasculature and the cardiomediastinal silhouette are within normal limits. IMPRESSION: 1. Low lung volumes without radiographic evidence of acute cardiopulmonary disease. Electronically Signed   By: Trudie Reed M.D.   On: 05/23/2023 07:59    Procedures Procedures    Medications Ordered in ED Medications  HYDROmorphone (DILAUDID) injection 1 mg (has no administration in time range)  ondansetron (ZOFRAN) injection 4 mg (has no administration in time range)    ED Course/ Medical Decision Making/ A&P                                 Medical Decision Making Amount and/or Complexity of Data Reviewed Labs:  ordered. Radiology: ordered.  Risk Prescription drug management.   Patient with 2 days worth of chest pain.  She is over the age of 7.  Patient states she is taking her Eliquis.  Does also have an IVC filter.  Will do troponin.  With the pain being constant for 2 days 1 troponin would suffice.  However she has got ongoing pain so we probably need to do delta troponin.  Will get chest x-ray will get basic labs place IV will treat with pain control.  And will get D-dimer.  If all negative will give patient information about follow-up with cardiology here.  It is possible that symptoms could be chest wall pain.  This also possible it could be secondary to PE even though she is got the filter making an unlikely but it is somewhat chest wall in nature but she is nontender to palpation on the chest here.   Patient's troponins x 2 very normal.  D-dimer not elevated 0.40.  CBC without any acute findings.  Hemoglobin down a little bit at 9.3.  With a follow-up as an outpatient.  Basic metabolic panel normal renal function normal.  Chest x-ray low lung volumes without any acute findings.  Patient with persistent chest pain but seems to be noncardiac in nature.  Will refer her to follow-up with cardiology.  Patient stable for discharge home.  No concerns for acute cardiac event no concerns for pulmonary embolism or DVT.  Patient received pain medicine here.  When she dozes off her oxygen levels go low.  When she is awake they are around 95%.  Patient was told that she has sleep apnea and was supposed to use CPAP.  But she is not doing that.  Will renew her Eliquis.  Final Clinical Impression(s) / ED Diagnoses Final diagnoses:  Atypical chest pain    Rx /  DC Orders ED Discharge Orders     None         Vanetta Mulders, MD 05/23/23 1610    Vanetta Mulders, MD 05/23/23 1019    Vanetta Mulders, MD 05/23/23 1110

## 2023-05-23 NOTE — ED Triage Notes (Addendum)
BIB bf from home for CP, onset 2d ago, "believes related to cough 1-2 weeks ago/ resolved", "think it is a pulled muscle", h/o same, wearing previously prescribed lidocaine patch w/o relief, no relief with oxycodone, denies other sx, worse with movement, improved when still, describes as 10/10 and constant. Denies fever, NVD, cough, congestion, sob, dizziness or other sx. Alert, NAD, calm, interactive, speaking in clear complete sentences. H/o DVT, PE and asthma. Takes Eliquis. Has IVC filter.

## 2023-05-23 NOTE — ED Notes (Signed)
Pt declining/ refusing 2nd trop. EDP made aware.

## 2023-05-23 NOTE — ED Notes (Signed)
Pt on ra at this time .

## 2023-05-23 NOTE — Discharge Instructions (Addendum)
Schedule appointment to follow-up with cardiology.  For additional workup.  They may want to do echocardiogram and nonexercise stress test.  Today's workup without any acute findings.  Stable for discharge home follow-up with your primary care doctor as well.  Return for any new or worse symptoms.  In addition your Eliquis has been renewed.  Also have given you Flexeril to help with the pain which could be chest wall pain in nature.

## 2023-05-23 NOTE — ED Notes (Signed)
EDP aware of oxygen and pt is able to be d/c'd.

## 2023-05-23 NOTE — ED Notes (Signed)
Pt was sleeping upon nurse entering the room. Oxygen below 90%. Pt readjusted in the bed woken up and oxygen increases above 90%. Nurse asked pt if she uses a cpap at night and pt states she is supposed to but she does not. Pt takes very shallow breathes while sleeping. Oxygen declines to 83 % RA while sleeping/snoring.  While pt is awake and sitting on the side of the bed oxygen is 95% Ra.

## 2023-09-16 ENCOUNTER — Emergency Department (HOSPITAL_COMMUNITY): Payer: Medicaid Other

## 2023-09-16 ENCOUNTER — Encounter (HOSPITAL_COMMUNITY): Payer: Self-pay

## 2023-09-16 ENCOUNTER — Other Ambulatory Visit: Payer: Self-pay

## 2023-09-16 ENCOUNTER — Inpatient Hospital Stay (HOSPITAL_COMMUNITY)
Admission: EM | Admit: 2023-09-16 | Discharge: 2023-09-17 | DRG: 389 | Disposition: A | Discharge status: Home / Self Care | Payer: Medicaid Other | Attending: Internal Medicine | Admitting: Internal Medicine

## 2023-09-16 DIAGNOSIS — Z79899 Other long term (current) drug therapy: Secondary | ICD-10-CM

## 2023-09-16 DIAGNOSIS — F17210 Nicotine dependence, cigarettes, uncomplicated: Secondary | ICD-10-CM | POA: Diagnosis present

## 2023-09-16 DIAGNOSIS — E119 Type 2 diabetes mellitus without complications: Secondary | ICD-10-CM | POA: Diagnosis present

## 2023-09-16 DIAGNOSIS — Z86711 Personal history of pulmonary embolism: Secondary | ICD-10-CM | POA: Diagnosis not present

## 2023-09-16 DIAGNOSIS — F39 Unspecified mood [affective] disorder: Secondary | ICD-10-CM | POA: Diagnosis present

## 2023-09-16 DIAGNOSIS — Z7984 Long term (current) use of oral hypoglycemic drugs: Secondary | ICD-10-CM | POA: Diagnosis not present

## 2023-09-16 DIAGNOSIS — Z720 Tobacco use: Secondary | ICD-10-CM | POA: Insufficient documentation

## 2023-09-16 DIAGNOSIS — D6859 Other primary thrombophilia: Secondary | ICD-10-CM | POA: Diagnosis present

## 2023-09-16 DIAGNOSIS — Z95828 Presence of other vascular implants and grafts: Secondary | ICD-10-CM

## 2023-09-16 DIAGNOSIS — Z825 Family history of asthma and other chronic lower respiratory diseases: Secondary | ICD-10-CM | POA: Diagnosis not present

## 2023-09-16 DIAGNOSIS — A084 Viral intestinal infection, unspecified: Secondary | ICD-10-CM | POA: Diagnosis present

## 2023-09-16 DIAGNOSIS — Z8249 Family history of ischemic heart disease and other diseases of the circulatory system: Secondary | ICD-10-CM

## 2023-09-16 DIAGNOSIS — E039 Hypothyroidism, unspecified: Secondary | ICD-10-CM | POA: Diagnosis present

## 2023-09-16 DIAGNOSIS — R300 Dysuria: Secondary | ICD-10-CM | POA: Diagnosis present

## 2023-09-16 DIAGNOSIS — Z833 Family history of diabetes mellitus: Secondary | ICD-10-CM

## 2023-09-16 DIAGNOSIS — Z6841 Body Mass Index (BMI) 40.0 and over, adult: Secondary | ICD-10-CM | POA: Diagnosis not present

## 2023-09-16 DIAGNOSIS — Z716 Tobacco abuse counseling: Secondary | ICD-10-CM | POA: Diagnosis not present

## 2023-09-16 DIAGNOSIS — D509 Iron deficiency anemia, unspecified: Secondary | ICD-10-CM | POA: Diagnosis present

## 2023-09-16 DIAGNOSIS — J45909 Unspecified asthma, uncomplicated: Secondary | ICD-10-CM | POA: Diagnosis present

## 2023-09-16 DIAGNOSIS — Z7901 Long term (current) use of anticoagulants: Secondary | ICD-10-CM | POA: Diagnosis not present

## 2023-09-16 DIAGNOSIS — G4733 Obstructive sleep apnea (adult) (pediatric): Secondary | ICD-10-CM | POA: Diagnosis present

## 2023-09-16 DIAGNOSIS — K56600 Partial intestinal obstruction, unspecified as to cause: Principal | ICD-10-CM | POA: Diagnosis present

## 2023-09-16 DIAGNOSIS — R1032 Left lower quadrant pain: Principal | ICD-10-CM

## 2023-09-16 LAB — URINALYSIS, ROUTINE W REFLEX MICROSCOPIC
Bilirubin Urine: NEGATIVE
Glucose, UA: NEGATIVE mg/dL
Hgb urine dipstick: NEGATIVE
Ketones, ur: NEGATIVE mg/dL
Nitrite: NEGATIVE
Protein, ur: 30 mg/dL — AB
Specific Gravity, Urine: 1.04 — ABNORMAL HIGH (ref 1.005–1.030)
Squamous Epithelial / HPF: >50 /[HPF] (ref 0–5)
pH: 5 (ref 5.0–8.0)

## 2023-09-16 LAB — LACTIC ACID, PLASMA: Lactic Acid, Venous: 0.8 mmol/L (ref 0.5–1.9)

## 2023-09-16 LAB — CBC
HCT: 33.4 % — ABNORMAL LOW (ref 36.0–46.0)
Hemoglobin: 9.4 g/dL — ABNORMAL LOW (ref 12.0–15.0)
MCH: 19.7 pg — ABNORMAL LOW (ref 26.0–34.0)
MCHC: 28.1 g/dL — ABNORMAL LOW (ref 30.0–36.0)
MCV: 70.2 fL — ABNORMAL LOW (ref 80.0–100.0)
Platelets: 445 10*3/uL — ABNORMAL HIGH (ref 150–400)
RBC: 4.76 MIL/uL (ref 3.87–5.11)
RDW: 19.5 % — ABNORMAL HIGH (ref 11.5–15.5)
WBC: 7.5 10*3/uL (ref 4.0–10.5)
nRBC: 0 % (ref 0.0–0.2)

## 2023-09-16 LAB — LIPASE, BLOOD: Lipase: 37 U/L (ref 11–51)

## 2023-09-16 LAB — GLUCOSE, CAPILLARY
Glucose-Capillary: 121 mg/dL — ABNORMAL HIGH (ref 70–99)
Glucose-Capillary: 94 mg/dL (ref 70–99)

## 2023-09-16 LAB — COMPREHENSIVE METABOLIC PANEL
ALT: 13 U/L (ref 0–44)
AST: 14 U/L — ABNORMAL LOW (ref 15–41)
Albumin: 3.3 g/dL — ABNORMAL LOW (ref 3.5–5.0)
Alkaline Phosphatase: 84 U/L (ref 38–126)
Anion gap: 7 (ref 5–15)
BUN: 12 mg/dL (ref 6–20)
CO2: 25 mmol/L (ref 22–32)
Calcium: 8.4 mg/dL — ABNORMAL LOW (ref 8.9–10.3)
Chloride: 106 mmol/L (ref 98–111)
Creatinine, Ser: 0.89 mg/dL (ref 0.44–1.00)
GFR, Estimated: >60 mL/min (ref >60)
Glucose, Bld: 173 mg/dL — ABNORMAL HIGH (ref 70–99)
Potassium: 3.9 mmol/L (ref 3.5–5.1)
Sodium: 138 mmol/L (ref 135–145)
Total Bilirubin: <0.2 mg/dL (ref 0.0–1.2)
Total Protein: 6.8 g/dL (ref 6.5–8.1)

## 2023-09-16 LAB — CBG MONITORING, ED
Glucose-Capillary: 139 mg/dL — ABNORMAL HIGH (ref 70–99)
Glucose-Capillary: 104 mg/dL — ABNORMAL HIGH (ref 70–99)

## 2023-09-16 LAB — RESPIRATORY PANEL BY PCR

## 2023-09-16 LAB — RESP PANEL BY RT-PCR (RSV, FLU A&B, COVID)  RVPGX2
Influenza A by PCR: NEGATIVE
Influenza B by PCR: NEGATIVE
Resp Syncytial Virus by PCR: NEGATIVE
SARS Coronavirus 2 by RT PCR: NEGATIVE

## 2023-09-16 LAB — POC URINE PREG, ED: Preg Test, Ur: NEGATIVE

## 2023-09-16 MED ORDER — LACTATED RINGERS IV BOLUS
1000.0000 mL | Freq: Once | INTRAVENOUS | Status: AC
  Administered 2023-09-16: 1000 mL via INTRAVENOUS

## 2023-09-16 MED ORDER — IOHEXOL 300 MG/ML  SOLN
100.0000 mL | Freq: Once | INTRAMUSCULAR | Status: AC | PRN
  Administered 2023-09-16: 100 mL via INTRAVENOUS

## 2023-09-16 MED ORDER — ONDANSETRON HCL 4 MG/2ML IJ SOLN
4.0000 mg | Freq: Once | INTRAMUSCULAR | Status: AC
  Administered 2023-09-16: 4 mg via INTRAVENOUS
  Filled 2023-09-16: qty 2

## 2023-09-16 MED ORDER — ONDANSETRON HCL 4 MG/2ML IJ SOLN: 4.0000 mg | Freq: Four times a day (QID) | INTRAMUSCULAR | Status: DC | PRN

## 2023-09-16 MED ORDER — HYDROMORPHONE HCL 1 MG/ML IJ SOLN
0.5000 mg | INTRAMUSCULAR | Status: DC | PRN
  Administered 2023-09-16 – 2023-09-17 (×4): 0.5 mg via INTRAVENOUS
  Filled 2023-09-16 (×5): qty 0.5

## 2023-09-16 MED ORDER — ALBUTEROL SULFATE HFA 108 (90 BASE) MCG/ACT IN AERS: 2.0000 | INHALATION_SPRAY | RESPIRATORY_TRACT | Status: DC | PRN

## 2023-09-16 MED ORDER — SODIUM CHLORIDE 0.9% FLUSH
3.0000 mL | Freq: Two times a day (BID) | INTRAVENOUS | Status: DC
  Administered 2023-09-16 – 2023-09-17 (×3): 3 mL via INTRAVENOUS

## 2023-09-16 MED ORDER — HYDROXYZINE HCL 10 MG PO TABS
10.0000 mg | ORAL_TABLET | Freq: Three times a day (TID) | ORAL | Status: DC | PRN
  Administered 2023-09-16: 10 mg via ORAL
  Filled 2023-09-16: qty 1

## 2023-09-16 MED ORDER — HYDROMORPHONE HCL 1 MG/ML IJ SOLN
1.0000 mg | Freq: Once | INTRAMUSCULAR | Status: AC
  Administered 2023-09-16: 1 mg via INTRAVENOUS
  Filled 2023-09-16: qty 1

## 2023-09-16 MED ORDER — NICOTINE 14 MG/24HR TD PT24
14.0000 mg | MEDICATED_PATCH | Freq: Every day | TRANSDERMAL | Status: DC
  Administered 2023-09-16: 14 mg via TRANSDERMAL
  Filled 2023-09-16 (×2): qty 1

## 2023-09-16 MED ORDER — ACETAMINOPHEN 10 MG/ML IV SOLN
1000.0000 mg | Freq: Four times a day (QID) | INTRAVENOUS | Status: AC
  Administered 2023-09-16 (×3): 1000 mg via INTRAVENOUS
  Filled 2023-09-16 (×4): qty 100

## 2023-09-16 MED ORDER — ENOXAPARIN SODIUM 120 MG/0.8ML IJ SOSY
115.0000 mg | PREFILLED_SYRINGE | Freq: Two times a day (BID) | INTRAMUSCULAR | Status: DC
  Administered 2023-09-16 – 2023-09-17 (×3): 115 mg via SUBCUTANEOUS
  Filled 2023-09-16 (×3): qty 0.8

## 2023-09-16 MED ORDER — METOCLOPRAMIDE HCL 5 MG/ML IJ SOLN
5.0000 mg | Freq: Once | INTRAMUSCULAR | Status: AC
  Administered 2023-09-16: 5 mg via INTRAVENOUS
  Filled 2023-09-16: qty 2

## 2023-09-16 MED ORDER — NICOTINE POLACRILEX 2 MG MT GUM: 2.0000 mg | CHEWING_GUM | OROMUCOSAL | Status: DC | PRN

## 2023-09-16 MED ORDER — ESCITALOPRAM OXALATE 10 MG PO TABS
20.0000 mg | ORAL_TABLET | Freq: Every day | ORAL | Status: DC
  Administered 2023-09-16 – 2023-09-17 (×2): 20 mg via ORAL
  Filled 2023-09-16 (×2): qty 2

## 2023-09-16 MED ORDER — SODIUM CHLORIDE 0.9 % IV SOLN: INTRAVENOUS | Status: AC

## 2023-09-16 MED ORDER — INSULIN ASPART 100 UNIT/ML IJ SOLN: 0.0000 [IU] | Freq: Three times a day (TID) | INTRAMUSCULAR | Status: DC

## 2023-09-16 NOTE — Progress Notes (Signed)
PHARMACY - ANTICOAGULATION CONSULT NOTE  Pharmacy Consult for enoxaparin Indication:  hypercoagulable state with h/o VTE  No Known Allergies  Patient Measurements: Height: 5\' 5"  (165.1 cm) Weight: 113.4 kg (250 lb) IBW/kg (Calculated) : 57  Vital Signs: Temp: 98.7 F (37.1 C) (01/16 0620) Temp Source: Oral (01/16 0620) BP: 111/85 (01/16 0620) Pulse Rate: 83 (01/16 0620)  Labs: Recent Labs    09/16/23 0210  HGB 9.4*  HCT 33.4*  PLT 445*  CREATININE 0.89    Estimated Creatinine Clearance: 103.5 mL/min (by C-G formula based on SCr of 0.89 mg/dL).   Medical History: Past Medical History:  Diagnosis Date   Asthma    Diabetes mellitus without complication (HCC)    DVT (deep venous thrombosis) (HCC)    Pulmonary emboli (HCC)    Sleep apnea    Thyroid disease     Assessment: 43yo female admitted with SBO, to transition from Eliquis (for protein C/S deficiency and h/o VTE, last dose 1/15 am) to enoxaparin.  Goal of Therapy:  Anti-Xa level 0.6-1 units/ml 4hrs after LMWH dose given Monitor platelets by anticoagulation protocol: Yes   Plan:  Enoxaparin 115mg  SQ Q12H. Monitor CBC.  Vernard Gambles, PharmD, BCPS  09/16/2023,7:00 AM

## 2023-09-16 NOTE — ED Notes (Signed)
Patient transported to CT 

## 2023-09-16 NOTE — ED Notes (Signed)
Pt getting labs drawn in V3- lab tech will bring pt back to room when finished.

## 2023-09-16 NOTE — Progress Notes (Signed)
[facility] PROGRESS NOTE    Progress Note  Donna Douglas  TGG:[id number] DOB: [birth year] DOA: 09/16/2023 PCP: Patient, No Pcp Per     Brief Narrative:   Donna Douglas is an 43 y.o. female past medical history of protein CNS deficiency, DVT and PE on Eliquis, also on IVC filter, obstructive sleep apnea and diabetes mellitus type 2, history of right ovarian torsion status post oophorectomy comes in with left lower large and abdominal pain with productive cough with nausea but no vomiting who started having diarrhea 2 days prior to admission still passing gas, she relates her kids were sick with an upper respiratory tract infection.  Assessment/Plan:   Suspect viral gastroenteritis: Mixed respiratory and GI symptoms, with a history positive for sick contact making viral etiology most likely. Respiratory panel has been sent, SARS-CoV-2 influenza PCR and RSV are negative CT scan of the abdomen pelvis show small bowel wall thickening. She is not vomiting, but is still having watery stools. Does not require NG tube, she is not vomiting. Holding antibiotics as she has remained afebrile with no leukocytosis. Will continue supportive care with IV fluids narcotics and Tylenol for analgesics. Allow a diet.  Tobacco abuse: She has been counseled.  Incidental left adnexal cyst : Follow-up with PCP as an outpatient.  Protein C&S/DVT and PE: Continue apixaban.  Hypothyroidism: She is on no medication.  Diet controlled diabetes mellitus type 2: Obstructive sleep apnea: Continue CPAP at night.  Chronic mild ascitic anemia: Her hemoglobin hematocrit is stable   DVT prophylaxis: lovenox Family Communication:none Status is: Inpatient Remains inpatient appropriate because: Diarrhea    Code Status:     Code Status Orders  (From admission, onward)           Start     Ordered   09/16/23 0526  Full code  Continuous       Question:  By:  Answer:  Other   09/16/23 0528            Code Status History     Date Active Date Inactive Code Status Order ID Comments User Context   10/05/2020 0052 10/06/2020 1829 Full Code [id number]  Lilyan Gilford, DO ED   03/01/2018 2021 03/02/2018 2049 Full Code [id number]  Lazaro Arms, MD Inpatient   04/16/2017 0521 04/17/2017 2014 Full Code [id number]  Houston Siren, MD ED   01/05/2016 2150 01/06/2016 2136 Full Code [id number]  Meredeth Ide, MD Inpatient         IV Access:   Peripheral IV   Procedures and diagnostic studies:   DG Chest 2 View Result Date: 09/16/2023 CLINICAL DATA:  Cough. EXAM: CHEST - 2 VIEW COMPARISON:  May 23, 2023 FINDINGS: The heart size and mediastinal contours are within normal limits. Attenuation artifact is seen as result of the patient's hair overlying the left apex. Very mild atelectasis is seen within the mid right lung. No acute infiltrate, pleural effusion or pneumothorax is identified. A radiopaque inferior vena cava filter is seen on the lateral view. The visualized skeletal structures are unremarkable. IMPRESSION: Very mild mid right lung atelectasis. Electronically Signed   By: Aram Candela M.D.   On: 09/16/2023 03:39   CT ABDOMEN PELVIS W CONTRAST Result Date: 09/16/2023 CLINICAL DATA:  Left lower quadrant pain. EXAM: CT ABDOMEN AND PELVIS WITH CONTRAST TECHNIQUE: Multidetector CT imaging of the abdomen and pelvis was performed using the standard protocol following bolus administration of intravenous contrast. RADIATION DOSE REDUCTION: This exam was performed according to  the departmental dose-optimization program which includes automated exposure control, adjustment of the mA and/or kV according to patient size and/or use of iterative reconstruction technique. CONTRAST:  OMNIPAQUE IOHEXOL 300 MG/ML  SOLN COMPARISON:  July 18, 2022 FINDINGS: Lower chest: Mild atelectasis is seen within the posterior aspect of the right lung base. Hepatobiliary: A stable 9 mm cystic appearing  focus is seen within the posterior aspect of the right lobe of the liver. No gallstones, gallbladder wall thickening, or biliary dilatation. Pancreas: Unremarkable. No pancreatic ductal dilatation or surrounding inflammatory changes. Spleen: Normal in size without focal abnormality. Adrenals/Urinary Tract: Adrenal glands are unremarkable. Kidneys are normal, without renal calculi, focal lesion, or hydronephrosis. The urinary bladder is poorly distended and subsequently limited in evaluation. Stomach/Bowel: Stomach is within normal limits. Appendix appears normal. Dilated loops of duodenum and proximal jejunum are seen within the mid and upper left abdomen (maximum small bowel diameter of approximately 3.3 cm). A gradual transition zone is seen within the medial aspect of the mid left abdomen (axial CT images 44 through 51, CT series 2). Vascular/Lymphatic: An inferior vena cava filter is in place. No additional significant vascular findings are present. No enlarged abdominal or pelvic lymph nodes. Reproductive: The uterus and right adnexa are unremarkable. A 2.0 cm diameter cyst is seen within the anterior aspect of the left adnexa. Other: No abdominal wall hernia or abnormality. No abdominopelvic ascites. Musculoskeletal: Moderate severity degenerative changes are seen at the level of L5-S1. IMPRESSION: 1. Findings consistent with a partial small bowel obstruction at the level of the mid jejunum. 2. 2.0 cm diameter left adnexal cyst, likely ovarian in origin. No follow-up imaging is recommended. 3. Inferior vena cava filter in place. Electronically Signed   By: Aram Candela M.D.   On: 09/16/2023 03:35     Medical Consultants:   None.   Subjective:    Donna Douglas relates her pain is ongoing.  Objective:    Vitals:   09/16/23 0600 09/16/23 0620 09/16/23 0700 09/16/23 0715  BP: 93/66 111/85 105/67 115/76  Pulse: 89 83 90 82  Resp:  10 16 15   Temp:  98.7 F (37.1 C)  97.7 F (36.5 C)   TempSrc:  Oral  Oral  SpO2: 96% 96% 95% 94%  Weight:      Height:       SpO2: 94 %  No intake or output data in the 24 hours ending 09/16/23 0950 Filed Weights   09/16/23 0130  Weight: 113.4 kg    Exam: General exam: In no acute distress. Respiratory system: Good air movement and clear to auscultation. Cardiovascular system: S1 & S2 heard, RRR. No JVD. Gastrointestinal system: Abdomen is nondistended, soft and left lower tenderness, no rebound Extremities: No pedal edema. Skin: No rashes, lesions or ulcers Psychiatry: Judgement and insight appear normal. Mood & affect appropriate.    Data Reviewed:    Labs: Basic Metabolic Panel: Recent Labs  Lab 09/16/23 0210  NA 138  K 3.9  CL 106  CO2 25  GLUCOSE 173*  BUN 12  CREATININE 0.89  CALCIUM 8.4*   GFR Estimated Creatinine Clearance: 103.5 mL/min (by C-G formula based on SCr of 0.89 mg/dL). Liver Function Tests: Recent Labs  Lab 09/16/23 0210  AST 14*  ALT 13  ALKPHOS 84  BILITOT <0.2  PROT 6.8  ALBUMIN 3.3*   Recent Labs  Lab 09/16/23 0210  LIPASE 37   No results for input(s): "AMMONIA" in the last 168 hours. Coagulation profile  No results for input(s): "INR", "PROTIME" in the last 168 hours. COVID-19 Labs  No results for input(s): "DDIMER", "FERRITIN", "LDH", "CRP" in the last 72 hours.  Lab Results  Component Value Date   SARSCOV2NAA NEGATIVE 09/16/2023   SARSCOV2NAA NEGATIVE 10/05/2020   SARSCOV2NAA POSITIVE (A) 05/26/2020    CBC: Recent Labs  Lab 09/16/23 0210  WBC 7.5  HGB 9.4*  HCT 33.4*  MCV 70.2*  PLT 445*   Cardiac Enzymes: No results for input(s): "CKTOTAL", "CKMB", "CKMBINDEX", "TROPONINI" in the last 168 hours. BNP (last 3 results) No results for input(s): "PROBNP" in the last 8760 hours. CBG: Recent Labs  Lab 09/16/23 0732  GLUCAP 139*   D-Dimer: No results for input(s): "DDIMER" in the last 72 hours. Hgb A1c: No results for input(s): "HGBA1C" in the last 72  hours. Lipid Profile: No results for input(s): "CHOL", "HDL", "LDLCALC", "TRIG", "CHOLHDL", "LDLDIRECT" in the last 72 hours. Thyroid function studies: No results for input(s): "TSH", "T4TOTAL", "T3FREE", "THYROIDAB" in the last 72 hours.  Invalid input(s): "FREET3" Anemia work up: No results for input(s): "VITAMINB12", "FOLATE", "FERRITIN", "TIBC", "IRON", "RETICCTPCT" in the last 72 hours. Sepsis Labs: Recent Labs  Lab 09/16/23 0210 09/16/23 0556  WBC 7.5  --   LATICACIDVEN  --  0.8   Microbiology Recent Results (from the past 240 hours)  Resp panel by RT-PCR (RSV, Flu A&B, Covid) Anterior Nasal Swab     Status: None   Collection Time: 09/16/23  2:58 AM   Specimen: Anterior Nasal Swab  Result Value Ref Range Status   SARS Coronavirus 2 by RT PCR NEGATIVE NEGATIVE Final    Comment: (NOTE) SARS-CoV-2 target nucleic acids are NOT DETECTED.  The SARS-CoV-2 RNA is generally detectable in upper respiratory specimens during the acute phase of infection. The lowest concentration of SARS-CoV-2 viral copies this assay can detect is 138 copies/mL. A negative result does not preclude SARS-Cov-2 infection and should not be used as the sole basis for treatment or other patient management decisions. A negative result may occur with  improper specimen collection/handling, submission of specimen other than nasopharyngeal swab, presence of viral mutation(s) within the areas targeted by this assay, and inadequate number of viral copies(<138 copies/mL). A negative result must be combined with clinical observations, patient history, and epidemiological information. The expected result is Negative.  Fact Sheet for Patients:  BloggerCourse.com  Fact Sheet for Healthcare Providers:  SeriousBroker.it  This test is no t yet approved or cleared by the [place] FDA and  has been authorized for detection and/or diagnosis of SARS-CoV-2 by FDA  under an Emergency Use Authorization (EUA). This EUA will remain  in effect (meaning this test can be used) for the duration of the COVID-19 declaration under Section 564(b)(1) of the Act, 21 U.S.C.section 360bbb-3(b)(1), unless the authorization is terminated  or revoked sooner.       Influenza A by PCR NEGATIVE NEGATIVE Final   Influenza B by PCR NEGATIVE NEGATIVE Final    Comment: (NOTE) The Xpert Xpress SARS-CoV-2/FLU/RSV plus assay is intended as an aid in the diagnosis of influenza from Nasopharyngeal swab specimens and should not be used as a sole basis for treatment. Nasal washings and aspirates are unacceptable for Xpert Xpress SARS-CoV-2/FLU/RSV testing.  Fact Sheet for Patients: BloggerCourse.com  Fact Sheet for Healthcare Providers: SeriousBroker.it  This test is not yet approved or cleared by the [place] FDA and has been authorized for detection and/or diagnosis of SARS-CoV-2 by FDA under an Emergency Use Authorization (  EUA). This EUA will remain in effect (meaning this test can be used) for the duration of the COVID-19 declaration under Section 564(b)(1) of the Act, 21 U.S.C. section 360bbb-3(b)(1), unless the authorization is terminated or revoked.     Resp Syncytial Virus by PCR NEGATIVE NEGATIVE Final    Comment: (NOTE) Fact Sheet for Patients: BloggerCourse.com  Fact Sheet for Healthcare Providers: SeriousBroker.it  This test is not yet approved or cleared by the [place] FDA and has been authorized for detection and/or diagnosis of SARS-CoV-2 by FDA under an Emergency Use Authorization (EUA). This EUA will remain in effect (meaning this test can be used) for the duration of the COVID-19 declaration under Section 564(b)(1) of the Act, 21 U.S.C. section 360bbb-3(b)(1), unless the authorization is terminated or revoked.  Performed at [facility], [address] [place], [place] [address]      Medications:    enoxaparin (LOVENOX) injection  115 mg Subcutaneous Q12H   escitalopram  20 mg Oral Daily   insulin aspart  0-6 Units Subcutaneous TID WC   nicotine  14 mg Transdermal Daily   sodium chloride flush  3 mL Intravenous Q12H   Continuous Infusions:  sodium chloride 100 mL/hr at 09/16/23 0733   acetaminophen Stopped (09/16/23 0645)      LOS: 0 days   Marinda Elk  [facility]  09/16/2023, 9:50 AM

## 2023-09-16 NOTE — H&P (Signed)
History and Physical    Donna Douglas WFU:[id number] DOB: 28-Jan-1981 DOA: 09/16/2023  PCP: Patient, No Pcp Per   Patient coming from: Home   Chief Complaint:  Chief Complaint  Patient presents with   Abdominal Pain   Dysuria    HPI:  Donna Douglas is a 43 y.o. 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  Reports symptoms actually started with coughing productive with green sputum, runny nose a few days ago.  Over the past 2 to 3 days worsening left lower quadrant abdominal pain.  Associated having nausea but no vomiting.  Having diarrhea with last episode 1/15 in the AM.  Still passing gas into yesterday evening.  Has positive sick contact with her children who have a URI.  Last took apixaban 1/15 in the morning.    Review of Systems:  ROS complete and negative except as marked above   No Known Allergies  Prior to Admission medications   Medication Sig Start Date End Date Taking? Authorizing Provider  acetaminophen (TYLENOL) 500 MG tablet Take 2 tablets (1,000 mg total) by mouth every 6 (six) hours as needed for mild pain (or Fever >/= 101). 12/29/22   Clark, Meghan R, PA-C  albuterol (VENTOLIN HFA) 108 (90 Base) MCG/ACT inhaler Inhale 2 puffs into the lungs every 6 (six) hours as needed for wheezing or shortness of breath. 10/06/20   Johnson, Clanford L, MD  albuterol (VENTOLIN HFA) 108 (90 Base) MCG/ACT inhaler Inhale 2 puffs into the lungs every 4 (four) hours as needed for wheezing or shortness of breath. 12/26/22   Bero, Elmer Sow, MD  apixaban (ELIQUIS) 5 MG TABS tablet Take 1 tablet (5 mg total) by mouth 2 (two) times daily. 05/23/23   Vanetta Mulders, MD  APIXABAN Everlene Balls) VTE STARTER PACK (10MG  AND 5MG ) Take as directed on package: start with two-5mg  tablets twice daily  for 7 days. On day 8, switch to one-5mg  tablet twice daily. 10/06/20   Cleora Fleet, MD  blood glucose meter kit and supplies Dispense based on patient and insurance preference. Use up to four times daily as directed. (FOR ICD-10 E10.9, E11.9). 10/06/20   Johnson, Clanford L, MD  cyclobenzaprine (FLEXERIL) 10 MG tablet Take 1 tablet (10 mg total) by mouth 2 (two) times daily as needed for muscle spasms. 01/16/23   Gareth Eagle, PA-C  cyclobenzaprine (FLEXERIL) 10 MG tablet Take 1 tablet (10 mg total) by mouth 3 (three) times daily as needed for muscle spasms. 05/23/23   Vanetta Mulders, MD  ibuprofen (ADVIL) 800 MG tablet Take 1 tablet (800 mg total) by mouth every 8 (eight) hours as needed for moderate pain. 10/06/20   Johnson, Clanford L, MD  ipratropium-albuterol (DUONEB) 0.5-2.5 (3) MG/3ML SOLN Take 3 mLs by nebulization daily. 09/11/20   [provider]  lidocaine (LIDODERM) 5 % Place 1 patch onto the skin daily. Remove & Discard patch within 12 hours or as directed by MD 12/26/22   Sabas Sous, MD  Meclizine HCl 25 MG CHEW Chew 1 tablet by mouth daily as needed for dizziness. 10/08/19   [provider]  metFORMIN (GLUCOPHAGE XR) 500 MG 24 hr tablet Take 1 daily with supper x 5 days, then 1 po BID with meals 10/06/20   Johnson, Clanford L, MD  nicotine (NICODERM CQ - DOSED IN MG/24  HOURS) 21 mg/24hr patch Place 1 patch (21 mg total) onto the skin daily. 10/07/20   Johnson, Clanford L, MD  oxyCODONE (ROXICODONE) 5 MG immediate release tablet Take 1 tablet (5 mg total) by mouth every 6 (six) hours as needed for severe pain. 12/29/22   Clark, Meghan R, PA-C  phenazopyridine (PYRIDIUM) 200 MG tablet Take 1 tablet (200 mg total) by mouth 3 (three) times daily as needed for pain. 07/18/22   Sabas Sous, MD  polyethylene glycol (MIRALAX) 17 g packet Take 17 g by mouth daily. 07/18/22   Sabas Sous, MD  traZODone (DESYREL) 50 MG tablet Take 1 tablet (50 mg total) by mouth at  bedtime as needed for sleep. 10/06/20   Cleora Fleet, MD    Past Medical History:  Diagnosis Date   Asthma    Diabetes mellitus without complication (HCC)    DVT (deep venous thrombosis) (HCC)    Pulmonary emboli (HCC)    Sleep apnea    Thyroid disease     Past Surgical History:  Procedure Laterality Date   anxiety     IVC FILTER PLACEMENT ([facility] HX)     SALPINGOOPHORECTOMY Right 03/01/2018   Procedure: LAPARATOMY WITH SALPINGO OOPHORECTOMY RIGHT SIDE;  Surgeon: Lazaro Arms, MD;  Location: [facility];  Service: Gynecology;  Laterality: Right;     reports that she has been smoking cigarettes. She has a 7.5 pack-year smoking history. She has never used smokeless tobacco. She reports that she does not currently use drugs after having used the following drugs: Marijuana. She reports that she does not drink alcohol.  Family History  Problem Relation Age of Onset   Asthma Father    Diabetes Mother    Hypertension Mother      Physical Exam: Vitals:   09/16/23 0600 09/16/23 0620 09/16/23 0700 09/16/23 0715  BP: 93/66 111/85 105/67 115/76  Pulse: 89 83 90 82  Resp:  10 16 15   Temp:  98.7 F (37.1 C)  97.7 F (36.5 C)  TempSrc:  Oral  Oral  SpO2: 96% 96% 95% 94%  Weight:      Height:        Gen: Awake, alert, NAD, well-appearing CV: Regular, normal S1, S2, no murmurs  Resp: Normal WOB, CTAB  Abd: Obese, nondistended,, normoactive, there is mild tenderness in the suprapubic and left lower quadrant regions.  No rebound, guarding, rigidity. MSK: Symmetric, no edema  Skin: No rashes or lesions to exposed skin  Neuro: Alert and interactive  Psych: euthymic, appropriate    Data review:   Labs reviewed, notable for:   Lactate within normal limit Chemistries and blood counts unremarkable, chronic anemia microcytic hCG negative\ UA contaminated  Micro:  Results for orders placed or performed during the hospital encounter of 09/16/23  Resp panel by RT-PCR (RSV, Flu  A&B, Covid) Anterior Nasal Swab     Status: None   Collection Time: 09/16/23  2:58 AM   Specimen: Anterior Nasal Swab  Result Value Ref Range Status   SARS Coronavirus 2 by RT PCR NEGATIVE NEGATIVE Final    Comment: (NOTE) SARS-CoV-2 target nucleic acids are NOT DETECTED.  The SARS-CoV-2 RNA is generally detectable in upper respiratory specimens during the acute phase of infection. The lowest concentration of SARS-CoV-2 viral copies this assay can detect is 138 copies/mL. A negative result does not preclude SARS-Cov-2 infection and should not be used as the sole basis for treatment or other patient management decisions. A negative result  may occur with  improper specimen collection/handling, submission of specimen other than nasopharyngeal swab, presence of viral mutation(s) within the areas targeted by this assay, and inadequate number of viral copies(<138 copies/mL). A negative result must be combined with clinical observations, patient history, and epidemiological information. The expected result is Negative.  Fact Sheet for Patients:  BloggerCourse.com  Fact Sheet for Healthcare Providers:  SeriousBroker.it  This test is no t yet approved or cleared by the [place] FDA and  has been authorized for detection and/or diagnosis of SARS-CoV-2 by FDA under an Emergency Use Authorization (EUA). This EUA will remain  in effect (meaning this test can be used) for the duration of the COVID-19 declaration under Section 564(b)(1) of the Act, 21 U.S.C.section 360bbb-3(b)(1), unless the authorization is terminated  or revoked sooner.       Influenza A by PCR NEGATIVE NEGATIVE Final   Influenza B by PCR NEGATIVE NEGATIVE Final    Comment: (NOTE) The Xpert Xpress SARS-CoV-2/FLU/RSV plus assay is intended as an aid in the diagnosis of influenza from Nasopharyngeal swab specimens and should not be used as a sole basis for treatment.  Nasal washings and aspirates are unacceptable for Xpert Xpress SARS-CoV-2/FLU/RSV testing.  Fact Sheet for Patients: BloggerCourse.com  Fact Sheet for Healthcare Providers: SeriousBroker.it  This test is not yet approved or cleared by the [place] FDA and has been authorized for detection and/or diagnosis of SARS-CoV-2 by FDA under an Emergency Use Authorization (EUA). This EUA will remain in effect (meaning this test can be used) for the duration of the COVID-19 declaration under Section 564(b)(1) of the Act, 21 U.S.C. section 360bbb-3(b)(1), unless the authorization is terminated or revoked.     Resp Syncytial Virus by PCR NEGATIVE NEGATIVE Final    Comment: (NOTE) Fact Sheet for Patients: BloggerCourse.com  Fact Sheet for Healthcare Providers: SeriousBroker.it  This test is not yet approved or cleared by the [place] FDA and has been authorized for detection and/or diagnosis of SARS-CoV-2 by FDA under an Emergency Use Authorization (EUA). This EUA will remain in effect (meaning this test can be used) for the duration of the COVID-19 declaration under Section 564(b)(1) of the Act, 21 U.S.C. section 360bbb-3(b)(1), unless the authorization is terminated or revoked.  Performed at [facility], [address], [place], [place] [address]     Imaging reviewed:  DG Chest 2 View Result Date: 09/16/2023 CLINICAL DATA:  Cough. EXAM: CHEST - 2 VIEW COMPARISON:  May 23, 2023 FINDINGS: The heart size and mediastinal contours are within normal limits. Attenuation artifact is seen as result of the patient's hair overlying the left apex. Very mild atelectasis is seen within the mid right lung. No acute infiltrate, pleural effusion or pneumothorax is identified. A radiopaque inferior vena cava filter is seen on the lateral view. The visualized skeletal structures are  unremarkable. IMPRESSION: Very mild mid right lung atelectasis. Electronically Signed   By: Aram Candela M.D.   On: 09/16/2023 03:39   CT ABDOMEN PELVIS W CONTRAST Result Date: 09/16/2023 CLINICAL DATA:  Left lower quadrant pain. EXAM: CT ABDOMEN AND PELVIS WITH CONTRAST TECHNIQUE: Multidetector CT imaging of the abdomen and pelvis was performed using the standard protocol following bolus administration of intravenous contrast. RADIATION DOSE REDUCTION: This exam was performed according to the departmental dose-optimization program which includes automated exposure control, adjustment of the mA and/or kV according to patient size and/or use of iterative reconstruction technique. CONTRAST:  OMNIPAQUE IOHEXOL 300 MG/ML  SOLN COMPARISON:  July 18, 2022 FINDINGS: Lower chest: Mild atelectasis is seen within the posterior aspect of the right lung base. Hepatobiliary: A stable 9 mm cystic appearing focus is seen within the posterior aspect of the right lobe of the liver. No gallstones, gallbladder wall thickening, or biliary dilatation. Pancreas: Unremarkable. No pancreatic ductal dilatation or surrounding inflammatory changes. Spleen: Normal in size without focal abnormality. Adrenals/Urinary Tract: Adrenal glands are unremarkable. Kidneys are normal, without renal calculi, focal lesion, or hydronephrosis. The urinary bladder is poorly distended and subsequently limited in evaluation. Stomach/Bowel: Stomach is within normal limits. Appendix appears normal. Dilated loops of duodenum and proximal jejunum are seen within the mid and upper left abdomen (maximum small bowel diameter of approximately 3.3 cm). A gradual transition zone is seen within the medial aspect of the mid left abdomen (axial CT images 44 through 51, CT series 2). Vascular/Lymphatic: An inferior vena cava filter is in place. No additional significant vascular findings are present. No enlarged abdominal or pelvic lymph nodes.  Reproductive: The uterus and right adnexa are unremarkable. A 2.0 cm diameter cyst is seen within the anterior aspect of the left adnexa. Other: No abdominal wall hernia or abnormality. No abdominopelvic ascites. Musculoskeletal: Moderate severity degenerative changes are seen at the level of L5-S1. IMPRESSION: 1. Findings consistent with a partial small bowel obstruction at the level of the mid jejunum. 2. 2.0 cm diameter left adnexal cyst, likely ovarian in origin. No follow-up imaging is recommended. 3. Inferior vena cava filter in place. Electronically Signed   By: Aram Candela M.D.   On: 09/16/2023 03:35    ED Course:  Treated with Dilaudid, Reglan, Zofran, 1 L IV fluid   Assessment/Plan:  43 y.o. 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  Incidental finding of possible partial small bowel obstruction on CT.  Suspect viral syndrome with URI/LRI and gastroenteritis Incidental finding possible partial small bowel obstruction at the mid jejunum Mixed respiratory and GI symptoms as described in history, positive sick contact making a viral syndrome likely cause.  Clinically without obstruction having diarrhea and passing gas yesterday evening.  WBC within normal limit, lactate within normal limit.  CT abdomen pelvis with possible partial small bowel obstruction in the mid jejunum with a gradual transition.  Otherwise chest x-ray with possible right-sided atelectasis.  Suspect incidental finding of partial obstruction more reflective of ileus in setting of viral gastroenteritis.  She has history of abdominal surgery in the past although less suspicious for adhesive disease. -For partial small bowel obstruction, clinically no  symptoms of obstruction.  No active nausea and vomiting.  Does not require NG tube at this time. -Consider general surgery consult if develops obstipation -Hold on antibiotics is likely a viral syndrome -For now n.p.o. with ice chips, advance as tolerates -Supportive care with maintenance IV fluid normal saline at 100 cc/h, Zofran as needed for nausea, Tylenol IV, Dilaudid 0.5 mg IV  every 4 hour as needed for severe pain -For now although complete obstruction is not likely transition her anticoagulation to enoxaparin, pharmacy to dose.  Smoking cessation: Currently smoking 1 pack/day, counseled on smoking cessation.  She is motivated to quit. - Nicotine patch 14 mg daily, add gum as needed for cravings.  Would provide a prescription at discharge.  Incidental findings on  imaging 2 cm left sided adnexal cyst: No follow-up imaging recommended per radiology  Chronic medical problems: Medication management: Pharmacy medication history not completed at time of admission, completed based on her report. Protein C/S deficiency, DVT/PE: See above, transitioned her apixaban to enoxaparin while inpatient considering possible partial small bowel obstruction.  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 Hypothyroidism: Not an active problem, not on medication for this. Diabetes: Diet controlled OSA: Not using CPAP Chronic anemia: Hemoglobin near baseline around 9.4, microcytic.  Check iron panel. Mood disorder: Continue home escitalopram 20 mg daily (last filled in September but states she is still taking.)   Body mass index is 41.6 kg/m. Morbid obesity affecting medical care per above  DVT prophylaxis:  Lovenox Code Status:  Full Code Diet:  Diet Orders (From admission, onward)     Start     Ordered   09/16/23 0524  Diet NPO time specified Except for: Ice Chips, Sips with Meds  Diet effective now        Question Answer Comment  Except for Ice Chips   Except for Sips with Meds      09/16/23 0525           Family Communication: Yes discussed with her fianc at the bedside Consults: None Admission status:   Inpatient, Telemetry bed  Severity of Illness: The appropriate patient status for this patient is INPATIENT. Inpatient status is judged to be reasonable and necessary in order to provide the required intensity of service to ensure the patient's safety. The patient's presenting symptoms, physical exam findings, and initial radiographic and laboratory data in the context of their chronic comorbidities is felt to place them at high risk for further clinical deterioration. Furthermore, it is not anticipated that the patient will be medically stable for discharge from the hospital within 2 midnights of admission.   * I certify that at the point of admission it is my clinical judgment that the patient will require inpatient hospital care spanning beyond 2 midnights from the point of admission due to high intensity of service, high risk for further deterioration and high frequency of surveillance required.*   Dolly Rias, MD [facility]  How to contact the [facility] Attending or Consulting provider 7A - 7P or covering provider during after hours 7P -7A, for this patient.  Check the care team in [facility] and look for a) attending/consulting [facility] provider listed and b) the [facility] team listed Log into [email or web address] and use [place]'s universal password to access. If you do not have the password, please contact the hospital operator. Locate the [facility]: [facility] Rhonda W provider you are looking for under [facility] and page to a number that you can be directly reached. If you still have difficulty reaching the provider, please page the [facility] (Director on Call) for the Hospitalists listed on amion for assistance.  09/16/2023, 7:44 AM

## 2023-09-16 NOTE — ED Notes (Signed)
Messaged provider again about patient c/o itching.

## 2023-09-16 NOTE — ED Notes (Signed)
Pt has been c/o itching. Dr Robb Matar aware

## 2023-09-16 NOTE — ED Triage Notes (Signed)
Pt to ED from home with c/o LLQ pain and dysuria that started 3 days ago, pt says it feels like may be a UTI, pt also asking to be checked out to see if her PNA is gone, pt was seen in [place] 2 weeks ago and told she had PNA.

## 2023-09-16 NOTE — ED Notes (Signed)
Transition of Care [facility]) - Inpatient Brief Assessment   Patient Details  Name: Donna Douglas MRN: [id number] Date of Birth: Jan 26, 1981  Transition of [facility]) CM/SW Contact:    Isabella Bowens, LCSWA Phone Number: 09/16/2023, 12:14 PM   Clinical Narrative: CSW spoke with patient at bedside. CSW noticed on chart that pt does not have a PCP. CSW asked patient and she confirmed. Patient did ask for information for a PCP near where she lives. Patient has transportation once she is medically ready. TOC will add information.  Transition of Care Department [facility]) has reviewed patient and no TOC needs have been identified at this time. We will continue to monitor patient advancement through interdisciplinary progression rounds. If new patient transition needs arise, please place a TOC consult.   Transition of Care Asessment: Insurance and Status: Insurance coverage has been reviewed Patient has primary care physician: No (Pt has out of state Medicaid- CSW will add a PCP near [place]) Home environment has been reviewed: Single family home with relatives and significant other Prior level of function:: Independent Prior/Current Home Services: No current home services Social Drivers of Health Review: SDOH reviewed no interventions necessary Readmission risk has been reviewed: Yes Transition of care needs: no transition of care needs at this time

## 2023-09-16 NOTE — Plan of Care (Signed)

## 2023-09-16 NOTE — BH Assessment (Signed)
Comprehensive Clinical Assessment (CCA) Note  09/16/2023 Donna Douglas [id number]  Disposition: Per Alan Mulder, NP patient is psychiatrically cleared.  Patient has been provided with referral information for therapists in the [place] area.   The patient demonstrates the following risk factors for suicide: Chronic risk factors for suicide include: N/A. Acute risk factors for suicide include: loss (financial, interpersonal, professional). Protective factors for this patient include: positive social support, responsibility to others (children, family), hope for the future, and life satisfaction. Considering these factors, the overall suicide risk at this point appears to be low. Patient is appropriate for outpatient follow up.  Per EDP note, "Patient presented to ED from home with c/o LLQ pain and dysuria that started 3 days ago, pt says it feels like may be a UTI, pt also asking to be checked out to see if her PNA is gone, pt was seen in [place] 2 weeks ago and told she had PNA."  This morning, per RN note, "Pt expressed wanting to talk with psychiatrist due to depression and unable to get over her child's dad passing last year. Pt states did feel she wished she was dead but that was months ago. Denies feeling si/hi/avh/wishing she was dead now. Pt states she just wants to talk to someone about it. 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  Patient states she lost her child's father last year on his birthday, when he turned 43 years old.   She states he passed away from pneumonia, covid and sepsis.   Patient states this has been really hard for her especiially since she was with this ex- partner for 20 years.  Another challenge is seeing the 47 y.o. son she had with him struggle with this loss, as he and his father were close.  Patient has not sought counseling for grief as of yet. Patient states she has "mental illness,"  however is  unable to identify any diagnosis.  When clinician mentioned depression, anxiety and or bipolar as possible mental health diagnoses, patient stated, " I have self diagnosed myself all of those."  Patient is encouraged to seek counseling for depression and grief.  She is encouraged to speak with the therapist she works with regarding possible referral to psychiatry for evaluation and medication management if the therapist recommends this.  Patient denies SI, HI, AVH or SA hx.  She admits to passive SI, stating she has passing "hopeless" feelings.  She denies ever having a plan or intent to harm herself, and she has no hx of attempts.  Patient preferred that her fianc remain in the room during assessment.  He does not engage in assessment.  Patient appears future oriented, discussing her future with her fianc.  She denies any other concerns at this time and appreciates referral information provided.   Chief Complaint:  Chief Complaint  Patient presents with   Abdominal Pain   Dysuria   Visit Diagnosis:  Situational Depression, associated with grief    CCA Screening, Triage and Referral (STR)  Patient Reported Information How did you hear about [place]? Self  What Is the Reason for Your Visit/Call Today? Patient initially presented for LLQ pain and dysuria.  This morinig, patient requested to see someone with psychiatry, as she has been struggling wiht depression/grief.  How Long Has This Been Causing You Problems? > than 6 months  What Do You Feel Would Help You the Most Today? Treatment for Depression or other mood problem  Have You Recently Had Any Thoughts About Hurting Yourself? No  Are You Planning to Commit Suicide/Harm Yourself At This time? No   Flowsheet Row ED to Hosp-Admission (Current) from 09/16/2023 in [facility] Emergency Department at [facility] ED from 05/23/2023 in [facility] Emergency Department at [facility] ED from 01/16/2023 in [facility] Emergency  Department at [facility]  C-SSRS RISK CATEGORY No Risk No Risk No Risk       Have you Recently Had Thoughts About Hurting Someone Karolee Ohs? No  Are You Planning to Harm Someone at This Time? No  Explanation: N/A   Have You Used Any Alcohol or Drugs in the Past 24 Hours? No  How Long Ago Did You Use Drugs or Alcohol? No data recorded What Did You Use and How Much? No data recorded  Do You Currently Have a Therapist/Psychiatrist? No  Name of Therapist/Psychiatrist:    Have You Been Recently Discharged From Any Office Practice or Programs? No  Explanation of Discharge From Practice/Program: No data recorded    CCA Screening Triage Referral Assessment Type of Contact: Face-to-Face  Telemedicine Service Delivery:   Is this Initial or Reassessment?   Date Telepsych consult ordered in [facility]:    Time Telepsych consult ordered in [facility]:    Location of Assessment: [facility] Assessment Services  Provider Location: [facility] Assessment Services   Collateral Involvement: None   Does Patient Have a Automotive engineer Guardian? No  Legal Guardian Contact Information: N/A  Copy of Legal Guardianship Form: -- (N/A)  Legal Guardian Notified of Arrival: -- (N/A)  Legal Guardian Notified of Pending Discharge: -- (N/A)  If Minor and Not Living with Parent(s), Who has Custody? N/A  Is CPS involved or ever been involved? Never  Is APS involved or ever been involved? Never   Patient Determined To Be At Risk for Harm To Self or Others Based on Review of Patient Reported Information or Presenting Complaint? No  Method: -- (N/A, no HI)  Availability of Means: -- (N/A, no HI)  Intent: -- (N/A, no HI)  Notification Required: -- (N/A, no HI)  Additional Information for Danger to Others Potential: -- (N/A, no HI)  Additional Comments for Danger to Others Potential: N/A, no HI  Are There Guns or Other Weapons in Your Home? No  Types of Guns/Weapons: N/A  Are These Weapons  Safely Secured?                            -- (N/A)  Who Could Verify You Are Able To Have These Secured: N/A  Do You Have any Outstanding Charges, Pending Court Dates, Parole/Probation? None  Contacted To Inform of Risk of Harm To Self or Others: -- (N/A, no HI)    Does Patient Present under Involuntary Commitment? No    [place] of Residence: Other (Comment) ([place], [place])   Patient Currently Receiving the Following Services: Not Receiving Services   Determination of Need: Routine (7 days)   Options For Referral: Medication Management; Outpatient Therapy     CCA Biopsychosocial Patient Reported Schizophrenia/Schizoaffective Diagnosis in Past: No   Strengths: Patient is seeking outpatient treatment, requesting referral information.  She has family support.   Mental Health Symptoms Depression:  Hopelessness; Sleep (too much or little)   Duration of Depressive symptoms: Duration of Depressive Symptoms: Greater than two weeks   Mania:  None   Anxiety:   Worrying; Tension  Psychosis:  None   Duration of Psychotic symptoms:    Trauma:  None   Obsessions:  None   Compulsions:  None   Inattention:  N/A   Hyperactivity/Impulsivity:  N/A   Oppositional/Defiant Behaviors:  N/A   Emotional Irregularity:  None   Other Mood/Personality Symptoms:  NA    Mental Status Exam Appearance and self-care  Stature:  Average   Weight:  Average weight   Clothing:  Casual   Grooming:  Normal   Cosmetic use:  Age appropriate   Posture/gait:  Normal   Motor activity:  Not Remarkable   Sensorium  Attention:  Normal   Concentration:  Normal   Orientation:  X5   Recall/memory:  Normal   Affect and Mood  Affect:  Appropriate   Mood:  Depressed   Relating  Eye contact:  Normal   Facial expression:  Depressed; Responsive   Attitude toward examiner:  Cooperative   Thought and Language  Speech flow: Clear and Coherent   Thought content:   Appropriate to Mood and Circumstances   Preoccupation:  None   Hallucinations:  None   Organization:  Intact   [employer] of Knowledge:  Average   Intelligence:  Average   Abstraction:  Normal   Judgement:  Fair   Dance movement psychotherapist:  Adequate   Insight:  Fair   Decision Making:  Normal   Social Functioning  Social Maturity:  Responsible   Social Judgement:  Normal   Stress  Stressors:  Grief/losses   Coping Ability:  Human resources officer Deficits:  None   Supports:  Family     Religion: Religion/Spirituality Are You A Religious Person?: No How Might This Affect Treatment?: N/A  Leisure/Recreation: Leisure / Recreation Do You Have Hobbies?: No  Exercise/Diet: Exercise/Diet Do You Exercise?: No Have You Gained or Lost A Significant Amount of Weight in the Past Six Months?: No Do You Follow a Special Diet?: No Do You Have Any Trouble Sleeping?: Yes Explanation of Sleeping Difficulties: Patient struggles to sleep at night, due to sleep apnea.  She is not on Cpap- her Cpap broke.  States she sleeps find during the day.   CCA Employment/Education Employment/Work Situation: Employment / Work Systems developer: Unemployed Has Patient ever Been in Equities trader?: No  Education: Education Is Patient Currently Attending School?: No Last Grade Completed: 12 Did You Product manager?: No Did You Have An Individualized Education Program (IIEP): No Did You Have Any Difficulty At [employer]?: No Patient's Education Has Been Impacted by Current Illness: No   CCA Family/Childhood History Family and Relationship History: Family history Marital status: Long term relationship Long term relationship, how long?: NA What types of issues is patient dealing with in the relationship?: None with current relationsip Additional relationship information: None Does patient have children?: Yes How many children?: 8 How is patient's relationship with  their children?: Denies concerns.  She has 1 child with her ex-partner who died last year.  She has 2 with current fiance and 5 with a previous partner.  Childhood History:  Childhood History By whom was/is the patient raised?: Mother, Father Did patient suffer any verbal/emotional/physical/sexual abuse as a child?: No Did patient suffer from severe childhood neglect?: No Has patient ever been sexually abused/assaulted/raped as an adolescent or adult?: No Was the patient ever a victim of a crime or a disaster?: No Witnessed domestic violence?: No Has patient been affected by domestic violence as an adult?: No  CCA Substance Use Alcohol/Drug Use: Alcohol / Drug Use Pain Medications: See MAR Prescriptions: See MAR Over the Counter: See MAR History of alcohol / drug use?: No history of alcohol / drug abuse Longest period of sobriety (when/how long): N/A                         ASAM's:  Six Dimensions of Multidimensional Assessment  Dimension 1:  Acute Intoxication and/or Withdrawal Potential:      Dimension 2:  Biomedical Conditions and Complications:      Dimension 3:  Emotional, Behavioral, or Cognitive Conditions and Complications:     Dimension 4:  Readiness to Change:     Dimension 5:  Relapse, Continued use, or Continued Problem Potential:     Dimension 6:  Recovery/Living Environment:     ASAM Severity Score:    ASAM Recommended Level of Treatment:     Substance use Disorder (SUD)    Recommendations for Services/Supports/Treatments:    Disposition Recommendation per psychiatric provider: There are no psychiatric contraindications to discharge at this time   DSM5 Diagnoses: Patient Active Problem List   Diagnosis Date Noted   Viral gastroenteritis 09/16/2023   Tobacco abuse 09/16/2023   Chronic deep vein thrombosis (DVT) (HCC) 10/06/2020   Pulmonary embolism (HCC) 10/05/2020   S/P right oophorectomy 03/01/2018   Pelvic pain in female     Torsion of right ovary and ovarian pedicle    Hemoperitoneum, nontraumatic    [redacted] weeks gestation of pregnancy    Pleurisy    Chest pain 04/16/2017   Intrauterine pregnancy 04/16/2017   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

## 2023-09-16 NOTE — ED Notes (Signed)
Pt receiving her TTS consult at this time.

## 2023-09-16 NOTE — ED Provider Notes (Signed)
[place] EMERGENCY DEPARTMENT AT [facility] Provider Note   CSN: [id number] Arrival date & time: 09/16/23  0122     History  Chief Complaint  Patient presents with   Abdominal Pain   Dysuria    Donna Douglas is a 43 y.o. female.  43 year old female who presents ER today with left lower quadrant abdominal pain, dysuria and persistent cough after pneumonia diagnosis.  Patient states that she was diagnosed with pneumonia about a month ago but is still been coughing.  No more having fevers no productive cough but is concerned that the pneumonia is still there.  Her main reason for coming tonight as she has pretty severe left lower quadrant abdominal pain that radiates towards her back.  No history of kidney stones.  They really think this before.  She states she has had a cyst that ruptured on the right that required oophorectomy but does not remember if that pain was like this or not.  No other abdominal surgeries in the past.  No recent sick contacts.  She has had some diarrhea and is still passing gas.   Abdominal Pain Associated symptoms: dysuria   Dysuria Associated symptoms: abdominal pain        Home Medications Prior to Admission medications   Medication Sig Start Date End Date Taking? Authorizing Provider  acetaminophen (TYLENOL) 500 MG tablet Take 2 tablets (1,000 mg total) by mouth every 6 (six) hours as needed for mild pain (or Fever >/= 101). 12/29/22   Clark, Meghan R, PA-C  albuterol (VENTOLIN HFA) 108 (90 Base) MCG/ACT inhaler Inhale 2 puffs into the lungs every 6 (six) hours as needed for wheezing or shortness of breath. 10/06/20   Johnson, Clanford L, MD  albuterol (VENTOLIN HFA) 108 (90 Base) MCG/ACT inhaler Inhale 2 puffs into the lungs every 4 (four) hours as needed for wheezing or shortness of breath. 12/26/22   Bero, Elmer Sow, MD  apixaban (ELIQUIS) 5 MG TABS tablet Take 1 tablet (5 mg total) by mouth 2 (two) times daily. 05/23/23   Vanetta Mulders,  MD  APIXABAN Everlene Balls) VTE STARTER PACK (10MG  AND 5MG ) Take as directed on package: start with two-5mg  tablets twice daily for 7 days. On day 8, switch to one-5mg  tablet twice daily. 10/06/20   Cleora Fleet, MD  blood glucose meter kit and supplies Dispense based on patient and insurance preference. Use up to four times daily as directed. (FOR ICD-10 E10.9, E11.9). 10/06/20   Johnson, Clanford L, MD  cyclobenzaprine (FLEXERIL) 10 MG tablet Take 1 tablet (10 mg total) by mouth 2 (two) times daily as needed for muscle spasms. 01/16/23   Gareth Eagle, PA-C  cyclobenzaprine (FLEXERIL) 10 MG tablet Take 1 tablet (10 mg total) by mouth 3 (three) times daily as needed for muscle spasms. 05/23/23   Vanetta Mulders, MD  ibuprofen (ADVIL) 800 MG tablet Take 1 tablet (800 mg total) by mouth every 8 (eight) hours as needed for moderate pain. 10/06/20   Johnson, Clanford L, MD  ipratropium-albuterol (DUONEB) 0.5-2.5 (3) MG/3ML SOLN Take 3 mLs by nebulization daily. 09/11/20   [provider]  lidocaine (LIDODERM) 5 % Place 1 patch onto the skin daily. Remove & Discard patch within 12 hours or as directed by MD 12/26/22   Sabas Sous, MD  Meclizine HCl 25 MG CHEW Chew 1 tablet by mouth daily as needed for dizziness. 10/08/19   [provider]  metFORMIN (GLUCOPHAGE XR) 500 MG 24 hr tablet Take  1 daily with supper x 5 days, then 1 po BID with meals 10/06/20   Johnson, Clanford L, MD  nicotine (NICODERM CQ - DOSED IN MG/24 HOURS) 21 mg/24hr patch Place 1 patch (21 mg total) onto the skin daily. 10/07/20   Johnson, Clanford L, MD  oxyCODONE (ROXICODONE) 5 MG immediate release tablet Take 1 tablet (5 mg total) by mouth every 6 (six) hours as needed for severe pain. 12/29/22   Clark, Meghan R, PA-C  phenazopyridine (PYRIDIUM) 200 MG tablet Take 1 tablet (200 mg total) by mouth 3 (three) times daily as needed for pain. 07/18/22   Sabas Sous, MD  polyethylene glycol (MIRALAX) 17 g packet Take 17 g by  mouth daily. 07/18/22   Sabas Sous, MD  traZODone (DESYREL) 50 MG tablet Take 1 tablet (50 mg total) by mouth at bedtime as needed for sleep. 10/06/20   Cleora Fleet, MD      Allergies    Patient has no known allergies.    Review of Systems   Review of Systems  Gastrointestinal:  Positive for abdominal pain.  Genitourinary:  Positive for dysuria.    Physical Exam Updated Vital Signs BP 119/65 (BP Location: Left Wrist)   Pulse 98   Temp 98.4 F (36.9 C) (Oral)   Resp 18   Ht 5\' 5"  (1.651 m)   Wt 113.4 kg   LMP 09/05/2023 (Approximate)   SpO2 94%   BMI 41.60 kg/m  Physical Exam Vitals and nursing note reviewed.  Constitutional:      Appearance: She is well-developed.  HENT:     Head: Normocephalic and atraumatic.  Cardiovascular:     Rate and Rhythm: Normal rate and regular rhythm.  Pulmonary:     Effort: No respiratory distress.     Breath sounds: No stridor.  Abdominal:     General: There is no distension.     Tenderness: There is abdominal tenderness in the left lower quadrant.  Musculoskeletal:     Cervical back: Normal range of motion.  Skin:    General: Skin is warm and dry.  Neurological:     General: No focal deficit present.     Mental Status: She is alert.     ED Results / Procedures / Treatments   Labs (all labs ordered are listed, but only abnormal results are displayed) Labs Reviewed  COMPREHENSIVE METABOLIC PANEL - Abnormal; Notable for the following components:      Result Value   Glucose, Bld 173 (*)    Calcium 8.4 (*)    Albumin 3.3 (*)    AST 14 (*)    All other components within normal limits  CBC - Abnormal; Notable for the following components:   Hemoglobin 9.4 (*)    HCT 33.4 (*)    MCV 70.2 (*)    MCH 19.7 (*)    MCHC 28.1 (*)    RDW 19.5 (*)    Platelets 445 (*)    All other components within normal limits  URINALYSIS, ROUTINE W REFLEX MICROSCOPIC - Abnormal; Notable for the following components:   APPearance  CLOUDY (*)    Specific Gravity, Urine 1.040 (*)    Protein, ur 30 (*)    Leukocytes,Ua MODERATE (*)    Bacteria, UA RARE (*)    All other components within normal limits  RESP PANEL BY RT-PCR (RSV, FLU A&B, COVID)  RVPGX2  LIPASE, BLOOD  POC URINE PREG, ED    EKG None  Radiology DG  Chest 2 View Result Date: 09/16/2023 CLINICAL DATA:  Cough. EXAM: CHEST - 2 VIEW COMPARISON:  May 23, 2023 FINDINGS: The heart size and mediastinal contours are within normal limits. Attenuation artifact is seen as result of the patient's hair overlying the left apex. Very mild atelectasis is seen within the mid right lung. No acute infiltrate, pleural effusion or pneumothorax is identified. A radiopaque inferior vena cava filter is seen on the lateral view. The visualized skeletal structures are unremarkable. IMPRESSION: Very mild mid right lung atelectasis. Electronically Signed   By: Aram Candela M.D.   On: 09/16/2023 03:39   CT ABDOMEN PELVIS W CONTRAST Result Date: 09/16/2023 CLINICAL DATA:  Left lower quadrant pain. EXAM: CT ABDOMEN AND PELVIS WITH CONTRAST TECHNIQUE: Multidetector CT imaging of the abdomen and pelvis was performed using the standard protocol following bolus administration of intravenous contrast. RADIATION DOSE REDUCTION: This exam was performed according to the departmental dose-optimization program which includes automated exposure control, adjustment of the mA and/or kV according to patient size and/or use of iterative reconstruction technique. CONTRAST:  OMNIPAQUE IOHEXOL 300 MG/ML  SOLN COMPARISON:  July 18, 2022 FINDINGS: Lower chest: Mild atelectasis is seen within the posterior aspect of the right lung base. Hepatobiliary: A stable 9 mm cystic appearing focus is seen within the posterior aspect of the right lobe of the liver. No gallstones, gallbladder wall thickening, or biliary dilatation. Pancreas: Unremarkable. No pancreatic ductal dilatation or surrounding  inflammatory changes. Spleen: Normal in size without focal abnormality. Adrenals/Urinary Tract: Adrenal glands are unremarkable. Kidneys are normal, without renal calculi, focal lesion, or hydronephrosis. The urinary bladder is poorly distended and subsequently limited in evaluation. Stomach/Bowel: Stomach is within normal limits. Appendix appears normal. Dilated loops of duodenum and proximal jejunum are seen within the mid and upper left abdomen (maximum small bowel diameter of approximately 3.3 cm). A gradual transition zone is seen within the medial aspect of the mid left abdomen (axial CT images 44 through 51, CT series 2). Vascular/Lymphatic: An inferior vena cava filter is in place. No additional significant vascular findings are present. No enlarged abdominal or pelvic lymph nodes. Reproductive: The uterus and right adnexa are unremarkable. A 2.0 cm diameter cyst is seen within the anterior aspect of the left adnexa. Other: No abdominal wall hernia or abnormality. No abdominopelvic ascites. Musculoskeletal: Moderate severity degenerative changes are seen at the level of L5-S1. IMPRESSION: 1. Findings consistent with a partial small bowel obstruction at the level of the mid jejunum. 2. 2.0 cm diameter left adnexal cyst, likely ovarian in origin. No follow-up imaging is recommended. 3. Inferior vena cava filter in place. Electronically Signed   By: Aram Candela M.D.   On: 09/16/2023 03:35    Procedures Procedures    Medications Ordered in ED Medications  HYDROmorphone (DILAUDID) injection 1 mg (1 mg Intravenous Given 09/16/23 0255)  ondansetron (ZOFRAN) injection 4 mg (4 mg Intravenous Given 09/16/23 0254)  lactated ringers bolus 1,000 mL (1,000 mLs Intravenous New Bag/Given 09/16/23 0258)  iohexol (OMNIPAQUE) 300 MG/ML solution 100 mL (100 mLs Intravenous Contrast Given 09/16/23 0317)  HYDROmorphone (DILAUDID) injection 1 mg (1 mg Intravenous Given 09/16/23 0438)  metoCLOPramide (REGLAN)  injection 5 mg (5 mg Intravenous Given 09/16/23 0436)    ED Course/ Medical Decision Making/ A&P                                 Medical Decision Making Amount  and/or Complexity of Data Reviewed Labs: ordered. Radiology: ordered.  Risk Prescription drug management. Decision regarding hospitalization.   Secondary to pain, tachycardia and history of ruptured cysts and known cyst on the left CT scan done to evaluate for same.  Discussed with the radiologist who noted that patient had a Liquick a partial small bowel obstruction with a transition zone of the left mid lower quadrant.  This is the area of her pain.  Maybe the diarrhea is uncal paresis.  Either way discussed with hospitalist for admission.  Patient n.p.o., nausea and pain meds provided.  Is not actively vomiting so we will hold on NG tube at this time. No peritonitis, fever, significant leukocytosis so will hold on surgery consult.    Final Clinical Impression(s) / ED Diagnoses Final diagnoses:  Left lower quadrant abdominal pain    Rx / DC Orders ED Discharge Orders     None         Miaa Latterell, Barbara Cower, MD 09/16/23 [phone]

## 2023-09-16 NOTE — ED Notes (Signed)
Messaged provider about patient requesting medication for itching at this time.

## 2023-09-16 NOTE — ED Notes (Signed)
Pt expressed wanting to talk with psychiatrist due to depression and unable to get over her child's dad passing last year. Pt states did feel she wished she was dead but that was months ago. Denies feeling si/hi/avh/wishing she was dead now. Pt states she just wants to talk to someone about it. Dr Robb Matar aware.

## 2023-09-16 NOTE — Discharge Instructions (Signed)
Outpatient therapy has been recommended, to address depressive symptoms and for grief support.  The following offer individual therapy.   [facility]  [address] [place], [place] [address] Phone: [phone]  [facility] [place] -- [phone]  [facility] [address] [place], [place] [address] Phone: [phone]

## 2023-09-17 DIAGNOSIS — A084 Viral intestinal infection, unspecified: Secondary | ICD-10-CM | POA: Diagnosis not present

## 2023-09-17 LAB — GLUCOSE, CAPILLARY: Glucose-Capillary: 105 mg/dL — ABNORMAL HIGH (ref 70–99)

## 2023-09-17 LAB — CBC
HCT: 31.6 % — ABNORMAL LOW (ref 36.0–46.0)
Hemoglobin: 8.8 g/dL — ABNORMAL LOW (ref 12.0–15.0)
MCH: 19.8 pg — ABNORMAL LOW (ref 26.0–34.0)
MCHC: 27.8 g/dL — ABNORMAL LOW (ref 30.0–36.0)
MCV: 71 fL — ABNORMAL LOW (ref 80.0–100.0)
Platelets: 401 10*3/uL — ABNORMAL HIGH (ref 150–400)
RBC: 4.45 MIL/uL (ref 3.87–5.11)
RDW: 19.3 % — ABNORMAL HIGH (ref 11.5–15.5)
WBC: 5.2 10*3/uL (ref 4.0–10.5)
nRBC: 0 % (ref 0.0–0.2)

## 2023-09-17 LAB — BASIC METABOLIC PANEL
Anion gap: 5 (ref 5–15)
BUN: 8 mg/dL (ref 6–20)
CO2: 26 mmol/L (ref 22–32)
Calcium: 8.6 mg/dL — ABNORMAL LOW (ref 8.9–10.3)
Chloride: 106 mmol/L (ref 98–111)
Creatinine, Ser: 0.65 mg/dL (ref 0.44–1.00)
GFR, Estimated: >60 mL/min (ref >60)
Glucose, Bld: 91 mg/dL (ref 70–99)
Potassium: 4.3 mmol/L (ref 3.5–5.1)
Sodium: 137 mmol/L (ref 135–145)

## 2023-09-17 LAB — PHOSPHORUS: Phosphorus: 3.1 mg/dL (ref 2.5–4.6)

## 2023-09-17 LAB — MAGNESIUM: Magnesium: 2 mg/dL (ref 1.7–2.4)

## 2023-09-17 LAB — IRON AND TIBC
Iron: 20 ug/dL — ABNORMAL LOW (ref 28–170)
Saturation Ratios: 5 % — ABNORMAL LOW (ref 10.4–31.8)
TIBC: 401 ug/dL (ref 250–450)
UIBC: 381 ug/dL

## 2023-09-17 LAB — TRANSFERRIN: Transferrin: 286 mg/dL (ref 192–382)

## 2023-09-17 LAB — HEMOGLOBIN A1C
Hgb A1c MFr Bld: 6.2 % — ABNORMAL HIGH (ref 4.8–5.6)
Mean Plasma Glucose: 131.24 mg/dL

## 2023-09-17 LAB — FERRITIN: Ferritin: 3 ng/mL — ABNORMAL LOW (ref 11–307)

## 2023-09-17 LAB — HIV ANTIBODY (ROUTINE TESTING W REFLEX): HIV Screen 4th Generation wRfx: NONREACTIVE

## 2023-09-17 MED ORDER — APIXABAN 5 MG PO TABS: 5.0000 mg | ORAL_TABLET | Freq: Two times a day (BID) | ORAL | 1 refills | Status: AC

## 2023-09-17 MED ORDER — FERROUS SULFATE 325 (65 FE) MG PO TBEC: 325.0000 mg | DELAYED_RELEASE_TABLET | Freq: Two times a day (BID) | ORAL | 3 refills | Status: AC

## 2023-09-17 NOTE — Discharge Summary (Signed)
Physician Discharge Summary  Donna Douglas JKK:[id number] DOB: 11/26/1980 DOA: 09/16/2023  PCP: Patient, No Pcp Per  Admit date: 09/16/2023 Discharge date: 09/17/2023  Admitted From: Home Disposition:  home  Recommendations for Outpatient Follow-up:  Follow up with PCP in 1-2 weeks, will need anemia panel as an outpatient, she may be iron deficient. Please obtain BMP/CBC in one week  Home Health:no Equipment/Devices:None  Discharge Condition:Stable CODE STATUS:Full Diet recommendation: Heart Healthy   Brief/Interim Summary: 43 y.o. female past medical history of protein CNS deficiency, DVT and PE on Eliquis, also on IVC filter, obstructive sleep apnea and diabetes mellitus type 2, history of right ovarian torsion status post oophorectomy comes in with left lower large and abdominal pain with productive cough with nausea but no vomiting who started having diarrhea 2 days prior to admission still passing gas, she relates her kids were sick with an upper respiratory tract infection.   Discharge Diagnoses:  Principal Problem:   Viral gastroenteritis Active Problems:   DM (diabetes mellitus) (HCC)   Protein C deficiency (HCC)   Tobacco abuse  Viral gastroenteritis: Mixed picture of respiratory and GI symptoms. Positive for sick contacts, her children which makes viral etiology most likely. Respiratory panel negative. SARS-CoV-2 PCR and influenza and RSV were negative. She was treated conservatively based on the clear liquid diet she tolerated this well. Her diarrhea resolved she was discharged in stable condition.  Tobacco abuse: She is been counseled.  Incidental left adnexal cyst: Follow-up with PCP as an outpatient.  Protein C and S deficiency/DVT and PE: No changes made to apixaban.  Diabetes mellitus type 2 diet controlled: Noted.  Started sleep apnea: Continue CPAP at night.  Chronic mild microcytic anemia: Follow-up PCP as an outpatient. She continue ferrous  sulfate as an outpatient.  Discharge Instructions  Discharge Instructions     Diet - low sodium heart healthy   Complete by: As directed    Increase activity slowly   Complete by: As directed       Allergies as of 09/17/2023   No Known Allergies      Medication List     STOP taking these medications    cyclobenzaprine 10 MG tablet Commonly known as: FLEXERIL   ibuprofen 800 MG tablet Commonly known as: ADVIL   lidocaine 5 % Commonly known as: Lidoderm   Meclizine HCl 25 MG Chew   nicotine 21 mg/24hr patch Commonly known as: NICODERM CQ - dosed in mg/24 hours   phenazopyridine 200 MG tablet Commonly known as: PYRIDIUM   polyethylene glycol 17 g packet Commonly known as: MiraLax   traZODone 50 MG tablet Commonly known as: DESYREL       TAKE these medications    acetaminophen 500 MG tablet Commonly known as: TYLENOL Take 2 tablets (1,000 mg total) by mouth every 6 (six) hours as needed for mild pain (or Fever >/= 101).   albuterol 108 (90 Base) MCG/ACT inhaler Commonly known as: Ventolin HFA Inhale 2 puffs into the lungs every 6 (six) hours as needed for wheezing or shortness of breath.   albuterol 108 (90 Base) MCG/ACT inhaler Commonly known as: VENTOLIN HFA Inhale 2 puffs into the lungs every 4 (four) hours as needed for wheezing or shortness of breath.   apixaban 5 MG Tabs tablet Commonly known as: ELIQUIS Take 1 tablet (5 mg total) by mouth 2 (two) times daily. What changed: Another medication with the same name was removed. Continue taking this medication, and follow the directions you see here.  blood glucose meter kit and supplies Dispense based on patient and insurance preference. Use up to four times daily as directed. (FOR ICD-10 E10.9, E11.9).   ipratropium-albuterol 0.5-2.5 (3) MG/3ML Soln Commonly known as: DUONEB Take 3 mLs by nebulization daily.   metFORMIN 500 MG 24 hr tablet Commonly known as: Glucophage XR Take 1 daily with  supper x 5 days, then 1 po BID with meals   oxyCODONE 5 MG immediate release tablet Commonly known as: Roxicodone Take 1 tablet (5 mg total) by mouth every 6 (six) hours as needed for severe pain.        Follow-up Information     [facility]. Call.   Why: Please follow up to see if they are accepting new patients. Contact information: [address] [place] [address] [phone]         [facility] [place]. Call.   Why: Please follow up to see if they are accepting new patients Contact information: [address], [place], [place] [address] P: [phone]        [facility]. Call.   Why: Follow up to see if this clinic is accepting new patients. Contact information: Address: [address] [place], [place] [address]  [phone]               No Known Allergies  Consultations: None   Procedures/Studies: DG Chest 2 View Result Date: 09/16/2023 CLINICAL DATA:  Cough. EXAM: CHEST - 2 VIEW COMPARISON:  May 23, 2023 FINDINGS: The heart size and mediastinal contours are within normal limits. Attenuation artifact is seen as result of the patient's hair overlying the left apex. Very mild atelectasis is seen within the mid right lung. No acute infiltrate, pleural effusion or pneumothorax is identified. A radiopaque inferior vena cava filter is seen on the lateral view. The visualized skeletal structures are unremarkable. IMPRESSION: Very mild mid right lung atelectasis. Electronically Signed   By: Aram Candela M.D.   On: 09/16/2023 03:39   CT ABDOMEN PELVIS W CONTRAST Result Date: 09/16/2023 CLINICAL DATA:  Left lower quadrant pain. EXAM: CT ABDOMEN AND PELVIS WITH CONTRAST TECHNIQUE: Multidetector CT imaging of the abdomen and pelvis was performed using the standard protocol following bolus administration of intravenous contrast. RADIATION DOSE REDUCTION: This exam was performed according to the  departmental dose-optimization program which includes automated exposure control, adjustment of the mA and/or kV according to patient size and/or use of iterative reconstruction technique. CONTRAST:  OMNIPAQUE IOHEXOL 300 MG/ML  SOLN COMPARISON:  July 18, 2022 FINDINGS: Lower chest: Mild atelectasis is seen within the posterior aspect of the right lung base. Hepatobiliary: A stable 9 mm cystic appearing focus is seen within the posterior aspect of the right lobe of the liver. No gallstones, gallbladder wall thickening, or biliary dilatation. Pancreas: Unremarkable. No pancreatic ductal dilatation or surrounding inflammatory changes. Spleen: Normal in size without focal abnormality. Adrenals/Urinary Tract: Adrenal glands are unremarkable. Kidneys are normal, without renal calculi, focal lesion, or hydronephrosis. The urinary bladder is poorly distended and subsequently limited in evaluation. Stomach/Bowel: Stomach is within normal limits. Appendix appears normal. Dilated loops of duodenum and proximal jejunum are seen within the mid and upper left abdomen (maximum small bowel diameter of approximately 3.3 cm). A gradual transition zone is seen within the medial aspect of the mid left abdomen (axial CT images 44 through 51, CT series 2). Vascular/Lymphatic: An inferior vena cava filter is in place. No additional significant vascular findings are present. No  enlarged abdominal or pelvic lymph nodes. Reproductive: The uterus and right adnexa are unremarkable. A 2.0 cm diameter cyst is seen within the anterior aspect of the left adnexa. Other: No abdominal wall hernia or abnormality. No abdominopelvic ascites. Musculoskeletal: Moderate severity degenerative changes are seen at the level of L5-S1. IMPRESSION: 1. Findings consistent with a partial small bowel obstruction at the level of the mid jejunum. 2. 2.0 cm diameter left adnexal cyst, likely ovarian in origin. No follow-up imaging is recommended. 3.  Inferior vena cava filter in place. Electronically Signed   By: Aram Candela M.D.   On: 09/16/2023 03:35      Subjective: No complaints  Discharge Exam: Vitals:   09/16/23 2200 09/17/23 0526  BP: (!) 104/55 101/61  Pulse: 79 63  Resp:    Temp: 97.8 F (36.6 C) 98.3 F (36.8 C)  SpO2:  98%   Vitals:   09/16/23 1544 09/16/23 2056 09/16/23 2200 09/17/23 0526  BP: 117/69 (!) 104/55 (!) 104/55 101/61  Pulse: 60 79 79 63  Resp: 16     Temp: 98 F (36.7 C) 97.8 F (36.6 C) 97.8 F (36.6 C) 98.3 F (36.8 C)  TempSrc: Oral Oral Oral Oral  SpO2: 97% 95%  98%  Weight:      Height:        General: Pt is alert, awake, not in acute distress Cardiovascular: RRR, S1/S2 +, no rubs, no gallops Respiratory: CTA bilaterally, no wheezing, no rhonchi Abdominal: Soft, NT, ND, bowel sounds + Extremities: no edema, no cyanosis    The results of significant diagnostics from this hospitalization (including imaging, microbiology, ancillary and laboratory) are listed below for reference.     Microbiology: Recent Results (from the past 240 hours)  Resp panel by RT-PCR (RSV, Flu A&B, Covid) Anterior Nasal Swab     Status: None   Collection Time: 09/16/23  2:58 AM   Specimen: Anterior Nasal Swab  Result Value Ref Range Status   SARS Coronavirus 2 by RT PCR NEGATIVE NEGATIVE Final    Comment: (NOTE) SARS-CoV-2 target nucleic acids are NOT DETECTED.  The SARS-CoV-2 RNA is generally detectable in upper respiratory specimens during the acute phase of infection. The lowest concentration of SARS-CoV-2 viral copies this assay can detect is 138 copies/mL. A negative result does not preclude SARS-Cov-2 infection and should not be used as the sole basis for treatment or other patient management decisions. A negative result may occur with  improper specimen collection/handling, submission of specimen other than nasopharyngeal swab, presence of viral mutation(s) within the areas targeted by  this assay, and inadequate number of viral copies(<138 copies/mL). A negative result must be combined with clinical observations, patient history, and epidemiological information. The expected result is Negative.  Fact Sheet for Patients:  BloggerCourse.com  Fact Sheet for Healthcare Providers:  SeriousBroker.it  This test is no t yet approved or cleared by the [place] FDA and  has been authorized for detection and/or diagnosis of SARS-CoV-2 by FDA under an Emergency Use Authorization (EUA). This EUA will remain  in effect (meaning this test can be used) for the duration of the COVID-19 declaration under Section 564(b)(1) of the Act, 21 U.S.C.section 360bbb-3(b)(1), unless the authorization is terminated  or revoked sooner.       Influenza A by PCR NEGATIVE NEGATIVE Final   Influenza B by PCR NEGATIVE NEGATIVE Final    Comment: (NOTE) The Xpert Xpress SARS-CoV-2/FLU/RSV plus assay is intended as an aid in the diagnosis of influenza  from Nasopharyngeal swab specimens and should not be used as a sole basis for treatment. Nasal washings and aspirates are unacceptable for Xpert Xpress SARS-CoV-2/FLU/RSV testing.  Fact Sheet for Patients: BloggerCourse.com  Fact Sheet for Healthcare Providers: SeriousBroker.it  This test is not yet approved or cleared by the [place] FDA and has been authorized for detection and/or diagnosis of SARS-CoV-2 by FDA under an Emergency Use Authorization (EUA). This EUA will remain in effect (meaning this test can be used) for the duration of the COVID-19 declaration under Section 564(b)(1) of the Act, 21 U.S.C. section 360bbb-3(b)(1), unless the authorization is terminated or revoked.     Resp Syncytial Virus by PCR NEGATIVE NEGATIVE Final    Comment: (NOTE) Fact Sheet for Patients: BloggerCourse.com  Fact Sheet  for Healthcare Providers: SeriousBroker.it  This test is not yet approved or cleared by the [place] FDA and has been authorized for detection and/or diagnosis of SARS-CoV-2 by FDA under an Emergency Use Authorization (EUA). This EUA will remain in effect (meaning this test can be used) for the duration of the COVID-19 declaration under Section 564(b)(1) of the Act, 21 U.S.C. section 360bbb-3(b)(1), unless the authorization is terminated or revoked.  Performed at [facility], [address], [place], [place] [address]   Respiratory (~20 pathogens) panel by PCR     Status: None   Collection Time: 09/16/23 12:03 PM  Result Value Ref Range Status   Adenovirus NOT DETECTED NOT DETECTED Final   Coronavirus 229E NOT DETECTED NOT DETECTED Final    Comment: (NOTE) The Coronavirus on the Respiratory Panel, DOES NOT test for the novel  Coronavirus (2019 nCoV)    Coronavirus HKU1 NOT DETECTED NOT DETECTED Final   Coronavirus NL63 NOT DETECTED NOT DETECTED Final   Coronavirus OC43 NOT DETECTED NOT DETECTED Final   Metapneumovirus NOT DETECTED NOT DETECTED Final   Rhinovirus / Enterovirus NOT DETECTED NOT DETECTED Final   Influenza A NOT DETECTED NOT DETECTED Final   Influenza B NOT DETECTED NOT DETECTED Final   Parainfluenza Virus 1 NOT DETECTED NOT DETECTED Final   Parainfluenza Virus 2 NOT DETECTED NOT DETECTED Final   Parainfluenza Virus 3 NOT DETECTED NOT DETECTED Final   Parainfluenza Virus 4 NOT DETECTED NOT DETECTED Final   Respiratory Syncytial Virus NOT DETECTED NOT DETECTED Final   Bordetella pertussis NOT DETECTED NOT DETECTED Final   Bordetella Parapertussis NOT DETECTED NOT DETECTED Final   Chlamydophila pneumoniae NOT DETECTED NOT DETECTED Final   Mycoplasma pneumoniae NOT DETECTED NOT DETECTED Final    Comment: Performed at [facility], [address] [place], [place] [address]     Labs: BNP (last 3 results) Recent Labs     01/16/23 1052  BNP 20.0   Basic Metabolic Panel: Recent Labs  Lab 09/16/23 0210 09/17/23 0504  NA 138 137  K 3.9 4.3  CL 106 106  CO2 25 26  GLUCOSE 173* 91  BUN 12 8  CREATININE 0.89 0.65  CALCIUM 8.4* 8.6*  MG  --  2.0  PHOS  --  3.1   Liver Function Tests: Recent Labs  Lab 09/16/23 0210  AST 14*  ALT 13  ALKPHOS 84  BILITOT <0.2  PROT 6.8  ALBUMIN 3.3*   Recent Labs  Lab 09/16/23 0210  LIPASE 37   No results for input(s): "AMMONIA" in the last 168 hours. CBC: Recent Labs  Lab 09/16/23 0210 09/17/23 0504  WBC 7.5 5.2  HGB 9.4* 8.8*  HCT 33.4* 31.6*  MCV 70.2* 71.0*  PLT 445* 401*   Cardiac Enzymes: No results for input(s): "CKTOTAL", "CKMB", "CKMBINDEX", "TROPONINI" in the last 168 hours. BNP: Invalid input(s): "POCBNP" CBG: Recent Labs  Lab 09/16/23 0732 09/16/23 1152 09/16/23 1741 09/16/23 2054  GLUCAP 139* 104* 121* 94   D-Dimer No results for input(s): "DDIMER" in the last 72 hours. Hgb A1c No results for input(s): "HGBA1C" in the last 72 hours. Lipid Profile No results for input(s): "CHOL", "HDL", "LDLCALC", "TRIG", "CHOLHDL", "LDLDIRECT" in the last 72 hours. Thyroid function studies No results for input(s): "TSH", "T4TOTAL", "T3FREE", "THYROIDAB" in the last 72 hours.  Invalid input(s): "FREET3" Anemia work up Recent Labs    09/17/23 0504  FERRITIN 3*  TIBC 401  IRON 20*   Urinalysis    Component Value Date/Time   COLORURINE YELLOW 09/16/2023 0258   APPEARANCEUR CLOUDY (A) 09/16/2023 0258   LABSPEC 1.040 (H) 09/16/2023 0258   PHURINE 5.0 09/16/2023 0258   GLUCOSEU NEGATIVE 09/16/2023 0258   HGBUR NEGATIVE 09/16/2023 0258   BILIRUBINUR NEGATIVE 09/16/2023 0258   KETONESUR NEGATIVE 09/16/2023 0258   PROTEINUR 30 (A) 09/16/2023 0258   NITRITE NEGATIVE 09/16/2023 0258   LEUKOCYTESUR MODERATE (A) 09/16/2023 0258   Sepsis Labs Recent Labs  Lab 09/16/23 0210 09/17/23 0504  WBC 7.5 5.2   Microbiology Recent Results  (from the past 240 hours)  Resp panel by RT-PCR (RSV, Flu A&B, Covid) Anterior Nasal Swab     Status: None   Collection Time: 09/16/23  2:58 AM   Specimen: Anterior Nasal Swab  Result Value Ref Range Status   SARS Coronavirus 2 by RT PCR NEGATIVE NEGATIVE Final    Comment: (NOTE) SARS-CoV-2 target nucleic acids are NOT DETECTED.  The SARS-CoV-2 RNA is generally detectable in upper respiratory specimens during the acute phase of infection. The lowest concentration of SARS-CoV-2 viral copies this assay can detect is 138 copies/mL. A negative result does not preclude SARS-Cov-2 infection and should not be used as the sole basis for treatment or other patient management decisions. A negative result may occur with  improper specimen collection/handling, submission of specimen other than nasopharyngeal swab, presence of viral mutation(s) within the areas targeted by this assay, and inadequate number of viral copies(<138 copies/mL). A negative result must be combined with clinical observations, patient history, and epidemiological information. The expected result is Negative.  Fact Sheet for Patients:  BloggerCourse.com  Fact Sheet for Healthcare Providers:  SeriousBroker.it  This test is no t yet approved or cleared by the [place] FDA and  has been authorized for detection and/or diagnosis of SARS-CoV-2 by FDA under an Emergency Use Authorization (EUA). This EUA will remain  in effect (meaning this test can be used) for the duration of the COVID-19 declaration under Section 564(b)(1) of the Act, 21 U.S.C.section 360bbb-3(b)(1), unless the authorization is terminated  or revoked sooner.       Influenza A by PCR NEGATIVE NEGATIVE Final   Influenza B by PCR NEGATIVE NEGATIVE Final    Comment: (NOTE) The Xpert Xpress SARS-CoV-2/FLU/RSV plus assay is intended as an aid in the diagnosis of influenza from Nasopharyngeal swab  specimens and should not be used as a sole basis for treatment. Nasal washings and aspirates are unacceptable for Xpert Xpress SARS-CoV-2/FLU/RSV testing.  Fact Sheet for Patients: BloggerCourse.com  Fact Sheet for Healthcare Providers: SeriousBroker.it  This test is not yet approved or cleared by the [place] FDA and has been authorized for detection and/or diagnosis of SARS-CoV-2 by FDA  under an Emergency Use Authorization (EUA). This EUA will remain in effect (meaning this test can be used) for the duration of the COVID-19 declaration under Section 564(b)(1) of the Act, 21 U.S.C. section 360bbb-3(b)(1), unless the authorization is terminated or revoked.     Resp Syncytial Virus by PCR NEGATIVE NEGATIVE Final    Comment: (NOTE) Fact Sheet for Patients: BloggerCourse.com  Fact Sheet for Healthcare Providers: SeriousBroker.it  This test is not yet approved or cleared by the [place] FDA and has been authorized for detection and/or diagnosis of SARS-CoV-2 by FDA under an Emergency Use Authorization (EUA). This EUA will remain in effect (meaning this test can be used) for the duration of the COVID-19 declaration under Section 564(b)(1) of the Act, 21 U.S.C. section 360bbb-3(b)(1), unless the authorization is terminated or revoked.  Performed at [facility] [address], [place], [place] [address]   Respiratory (~20 pathogens) panel by PCR     Status: None   Collection Time: 09/16/23 12:03 PM  Result Value Ref Range Status   Adenovirus NOT DETECTED NOT DETECTED Final   Coronavirus 229E NOT DETECTED NOT DETECTED Final    Comment: (NOTE) The Coronavirus on the Respiratory Panel, DOES NOT test for the novel  Coronavirus (2019 nCoV)    Coronavirus HKU1 NOT DETECTED NOT DETECTED Final   Coronavirus NL63 NOT DETECTED NOT DETECTED Final   Coronavirus OC43 NOT DETECTED  NOT DETECTED Final   Metapneumovirus NOT DETECTED NOT DETECTED Final   Rhinovirus / Enterovirus NOT DETECTED NOT DETECTED Final   Influenza A NOT DETECTED NOT DETECTED Final   Influenza B NOT DETECTED NOT DETECTED Final   Parainfluenza Virus 1 NOT DETECTED NOT DETECTED Final   Parainfluenza Virus 2 NOT DETECTED NOT DETECTED Final   Parainfluenza Virus 3 NOT DETECTED NOT DETECTED Final   Parainfluenza Virus 4 NOT DETECTED NOT DETECTED Final   Respiratory Syncytial Virus NOT DETECTED NOT DETECTED Final   Bordetella pertussis NOT DETECTED NOT DETECTED Final   Bordetella Parapertussis NOT DETECTED NOT DETECTED Final   Chlamydophila pneumoniae NOT DETECTED NOT DETECTED Final   Mycoplasma pneumoniae NOT DETECTED NOT DETECTED Final    Comment: Performed at [facility], [address], [place], [place] [address]     Time coordinating discharge: Over 35 minutes  SIGNED:   Marinda Elk, MD  [facility] 09/17/2023, 7:17 AM Pager   If 7PM-7AM, please contact night-coverage [email or web address] Password [email or web address]

## 2023-09-19 ENCOUNTER — Emergency Department (HOSPITAL_COMMUNITY): Payer: Medicaid Other

## 2023-09-19 ENCOUNTER — Other Ambulatory Visit: Payer: Self-pay

## 2023-09-19 ENCOUNTER — Emergency Department (HOSPITAL_COMMUNITY)
Admission: EM | Admit: 2023-09-19 | Discharge: 2023-09-19 | Disposition: A | Discharge status: Home / Self Care | Payer: Medicaid Other | Attending: Emergency Medicine | Admitting: Emergency Medicine

## 2023-09-19 DIAGNOSIS — Z7984 Long term (current) use of oral hypoglycemic drugs: Secondary | ICD-10-CM | POA: Insufficient documentation

## 2023-09-19 DIAGNOSIS — J45909 Unspecified asthma, uncomplicated: Secondary | ICD-10-CM | POA: Diagnosis not present

## 2023-09-19 DIAGNOSIS — Z7901 Long term (current) use of anticoagulants: Secondary | ICD-10-CM | POA: Diagnosis not present

## 2023-09-19 DIAGNOSIS — R1084 Generalized abdominal pain: Secondary | ICD-10-CM | POA: Diagnosis not present

## 2023-09-19 DIAGNOSIS — R350 Frequency of micturition: Secondary | ICD-10-CM | POA: Diagnosis not present

## 2023-09-19 DIAGNOSIS — E119 Type 2 diabetes mellitus without complications: Secondary | ICD-10-CM | POA: Diagnosis not present

## 2023-09-19 DIAGNOSIS — R109 Unspecified abdominal pain: Secondary | ICD-10-CM | POA: Diagnosis present

## 2023-09-19 LAB — COMPREHENSIVE METABOLIC PANEL
ALT: 17 U/L (ref 0–44)
AST: 19 U/L (ref 15–41)
Albumin: 3.2 g/dL — ABNORMAL LOW (ref 3.5–5.0)
Alkaline Phosphatase: 74 U/L (ref 38–126)
Anion gap: 3 — ABNORMAL LOW (ref 5–15)
BUN: 17 mg/dL (ref 6–20)
CO2: 25 mmol/L (ref 22–32)
Calcium: 8.5 mg/dL — ABNORMAL LOW (ref 8.9–10.3)
Chloride: 111 mmol/L (ref 98–111)
Creatinine, Ser: 0.81 mg/dL (ref 0.44–1.00)
GFR, Estimated: >60 mL/min (ref >60)
Glucose, Bld: 125 mg/dL — ABNORMAL HIGH (ref 70–99)
Potassium: 4 mmol/L (ref 3.5–5.1)
Sodium: 139 mmol/L (ref 135–145)
Total Bilirubin: 0.4 mg/dL (ref 0.0–1.2)
Total Protein: 6.8 g/dL (ref 6.5–8.1)

## 2023-09-19 LAB — URINALYSIS, ROUTINE W REFLEX MICROSCOPIC
Bacteria, UA: NONE SEEN
Bilirubin Urine: NEGATIVE
Glucose, UA: NEGATIVE mg/dL
Hgb urine dipstick: NEGATIVE
Ketones, ur: NEGATIVE mg/dL
Nitrite: NEGATIVE
Protein, ur: NEGATIVE mg/dL
Specific Gravity, Urine: 1.029 (ref 1.005–1.030)
pH: 6 (ref 5.0–8.0)

## 2023-09-19 LAB — CBC WITH DIFFERENTIAL/PLATELET
Abs Immature Granulocytes: 0.02 10*3/uL (ref 0.00–0.07)
Basophils Absolute: 0 10*3/uL (ref 0.0–0.1)
Basophils Relative: 0 %
Eosinophils Absolute: 0.1 10*3/uL (ref 0.0–0.5)
Eosinophils Relative: 1 %
HCT: 31.8 % — ABNORMAL LOW (ref 36.0–46.0)
Hemoglobin: 9.3 g/dL — ABNORMAL LOW (ref 12.0–15.0)
Immature Granulocytes: 0 %
Lymphocytes Relative: 36 %
Lymphs Abs: 2.7 10*3/uL (ref 0.7–4.0)
MCH: 20.4 pg — ABNORMAL LOW (ref 26.0–34.0)
MCHC: 29.2 g/dL — ABNORMAL LOW (ref 30.0–36.0)
MCV: 69.6 fL — ABNORMAL LOW (ref 80.0–100.0)
Monocytes Absolute: 0.6 10*3/uL (ref 0.1–1.0)
Monocytes Relative: 7 %
Neutro Abs: 4.2 10*3/uL (ref 1.7–7.7)
Neutrophils Relative %: 56 %
Platelets: 447 10*3/uL — ABNORMAL HIGH (ref 150–400)
RBC: 4.57 MIL/uL (ref 3.87–5.11)
RDW: 19.3 % — ABNORMAL HIGH (ref 11.5–15.5)
WBC: 7.6 10*3/uL (ref 4.0–10.5)
nRBC: 0 % (ref 0.0–0.2)

## 2023-09-19 MED ORDER — ONDANSETRON HCL 4 MG/2ML IJ SOLN
4.0000 mg | Freq: Once | INTRAMUSCULAR | Status: AC
  Administered 2023-09-19: 4 mg via INTRAVENOUS
  Filled 2023-09-19: qty 2

## 2023-09-19 MED ORDER — MORPHINE SULFATE (PF) 4 MG/ML IV SOLN
4.0000 mg | Freq: Once | INTRAVENOUS | Status: AC
  Administered 2023-09-19: 4 mg via INTRAVENOUS
  Filled 2023-09-19: qty 1

## 2023-09-19 MED ORDER — KETOROLAC TROMETHAMINE 30 MG/ML IJ SOLN
30.0000 mg | Freq: Once | INTRAMUSCULAR | Status: AC
  Administered 2023-09-19: 30 mg via INTRAVENOUS
  Filled 2023-09-19: qty 1

## 2023-09-19 MED ORDER — HYDROXYZINE HCL 25 MG PO TABS
50.0000 mg | ORAL_TABLET | Freq: Once | ORAL | Status: AC
  Administered 2023-09-19: 50 mg via ORAL
  Filled 2023-09-19: qty 2

## 2023-09-19 MED ORDER — IOHEXOL 300 MG/ML  SOLN
100.0000 mL | Freq: Once | INTRAMUSCULAR | Status: AC | PRN
  Administered 2023-09-19: 100 mL via INTRAVENOUS

## 2023-09-19 MED ORDER — DICYCLOMINE HCL 10 MG PO CAPS
10.0000 mg | ORAL_CAPSULE | Freq: Once | ORAL | Status: AC
  Administered 2023-09-19: 10 mg via ORAL
  Filled 2023-09-19: qty 1

## 2023-09-19 MED ORDER — CEPHALEXIN 500 MG PO CAPS: 500.0000 mg | ORAL_CAPSULE | Freq: Three times a day (TID) | ORAL | 0 refills | Status: AC

## 2023-09-19 NOTE — ED Notes (Signed)
Patient transported to CT 

## 2023-09-19 NOTE — ED Notes (Signed)
Upon entering patient's room for discharge, patient states that Toradol is not going to help her pain and is requesting something to "help her sleep". When this nurse explained that Toradol is all the doctor has prescribed, patient states "but I just need something to help me sleep because I just cannot get comfortable". MD made aware of patient request

## 2023-09-19 NOTE — ED Triage Notes (Signed)
Patient from home for lower abd pain that started Wednesday. Was admitted to the hospital Wednesday & was discharged on Friday. Also reports increased urination. Denies nausea, vomiting, diarrhea, or constipation. Upon arrival to ER, patient is alert and oriented, ambu

## 2023-09-19 NOTE — ED Notes (Signed)
While reviewing discharge instructions, patient requests "the doctor to send pain medication to her pharmacy". This nurse explained that the doctor has already talked to her about these requests and the only medication sent to her pharmacy was an antibiotic.

## 2023-09-19 NOTE — Discharge Instructions (Signed)
You were seen today for ongoing abdominal pain.  Your workup is largely reassuring including repeat CT imaging.  Given your urinary symptoms, we will treat you for urinary tract infection.  Urine culture is pending.  Take ibuprofen or Tylenol as needed for pain.

## 2023-09-19 NOTE — ED Provider Notes (Signed)
[place] [facility] AT [facility] Provider Note   CSN: [id number] Arrival date & time: 09/19/23  0037     History  Chief Complaint  Patient presents with   Abdominal Pain    Donna Douglas is a 43 y.o. female.  HPI     This is a 42-year female who presents with ongoing abdominal pain.  Patient reports she has had ongoing abdominal discomfort and nausea.  She describes the discomfort as pressure in her lower abdomen.  No dysuria but does report urinary frequency.  She was just admitted and discharged on Friday with likely gastroenteritis.  There was some concern for potential SBO but patient clinically more consistent with gastroenteritis.  No fevers.  Denies any ongoing nausea or vomiting.  Home Medications Prior to Admission medications   Medication Sig Start Date End Date Taking? Authorizing Provider  cephALEXin (KEFLEX) 500 MG capsule Take 1 capsule (500 mg total) by mouth 3 (three) times daily. 09/19/23  Yes Courage Biglow, Mayer Masker, MD  acetaminophen (TYLENOL) 500 MG tablet Take 2 tablets (1,000 mg total) by mouth every 6 (six) hours as needed for mild pain (or Fever >/= 101). Patient not taking: Reported on 09/16/2023 12/29/22   Melton Alar R, PA-C  albuterol (VENTOLIN HFA) 108 (90 Base) MCG/ACT inhaler Inhale 2 puffs into the lungs every 6 (six) hours as needed for wheezing or shortness of breath. 10/06/20   Johnson, Clanford L, MD  albuterol (VENTOLIN HFA) 108 (90 Base) MCG/ACT inhaler Inhale 2 puffs into the lungs every 4 (four) hours as needed for wheezing or shortness of breath. 12/26/22   Bero, Elmer Sow, MD  apixaban (ELIQUIS) 5 MG TABS tablet Take 1 tablet (5 mg total) by mouth 2 (two) times daily. 09/17/23   Marinda Elk, MD  blood glucose meter kit and supplies Dispense based on patient and insurance preference. Use up to four times daily as directed. (FOR ICD-10 E10.9, E11.9). 10/06/20   Johnson, Clanford L, MD  ferrous sulfate 325 (65 FE) MG EC  tablet Take 1 tablet (325 mg total) by mouth 2 (two) times daily. 09/17/23 09/16/24  Marinda Elk, MD  ipratropium-albuterol (DUONEB) 0.5-2.5 (3) MG/3ML SOLN Take 3 mLs by nebulization daily. 09/11/20   [provider]  metFORMIN (GLUCOPHAGE XR) 500 MG 24 hr tablet Take 1 daily with supper x 5 days, then 1 po BID with meals Patient not taking: Reported on 09/16/2023 10/06/20   Cleora Fleet, MD  oxyCODONE (ROXICODONE) 5 MG immediate release tablet Take 1 tablet (5 mg total) by mouth every 6 (six) hours as needed for severe pain. Patient not taking: Reported on 09/16/2023 12/29/22   Lenard Simmer, PA-C      Allergies    Patient has no known allergies.    Review of Systems   Review of Systems  Constitutional:  Negative for fever.  Respiratory:  Negative for shortness of breath.   Cardiovascular:  Negative for chest pain.  Gastrointestinal:  Positive for abdominal pain and diarrhea. Negative for nausea and vomiting.  All other systems reviewed and are negative.   Physical Exam Updated Vital Signs BP 117/74   Pulse 81   Temp 98.1 F (36.7 C) (Oral)   Resp 17   Ht 1.651 m (5\' 5" )   Wt 113.4 kg   LMP 09/05/2023 (Approximate)   SpO2 99%   BMI 41.60 kg/m  Physical Exam Vitals and nursing note reviewed.  Constitutional:      Appearance: She  is well-developed. She is obese. She is not ill-appearing.  HENT:     Head: Normocephalic and atraumatic.  Eyes:     Pupils: Pupils are equal, round, and reactive to light.  Cardiovascular:     Rate and Rhythm: Normal rate and regular rhythm.     Heart sounds: Normal heart sounds.  Pulmonary:     Effort: Pulmonary effort is normal. No respiratory distress.     Breath sounds: No wheezing.  Abdominal:     General: Bowel sounds are normal.     Palpations: Abdomen is soft.     Tenderness: There is no abdominal tenderness. There is no guarding or rebound.  Musculoskeletal:     Cervical back: Neck supple.  Skin:    General:  Skin is warm and dry.  Neurological:     General: No focal deficit present.     Mental Status: She is alert and oriented to person, place, and time.     ED Results / Procedures / Treatments   Labs (all labs ordered are listed, but only abnormal results are displayed) Labs Reviewed  CBC WITH DIFFERENTIAL/PLATELET - Abnormal; Notable for the following components:      Result Value   Hemoglobin 9.3 (*)    HCT 31.8 (*)    MCV 69.6 (*)    MCH 20.4 (*)    MCHC 29.2 (*)    RDW 19.3 (*)    Platelets 447 (*)    All other components within normal limits  COMPREHENSIVE METABOLIC PANEL - Abnormal; Notable for the following components:   Glucose, Bld 125 (*)    Calcium 8.5 (*)    Albumin 3.2 (*)    Anion gap 3 (*)    All other components within normal limits  URINALYSIS, ROUTINE W REFLEX MICROSCOPIC - Abnormal; Notable for the following components:   APPearance HAZY (*)    Leukocytes,Ua LARGE (*)    All other components within normal limits  URINE CULTURE    EKG None  Radiology CT ABDOMEN PELVIS W CONTRAST Result Date: 09/19/2023 CLINICAL DATA:  43 year old female with history of acute onset of nonlocalized abdominal pain. EXAM: CT ABDOMEN AND PELVIS WITH CONTRAST TECHNIQUE: Multidetector CT imaging of the abdomen and pelvis was performed using the standard protocol following bolus administration of intravenous contrast. RADIATION DOSE REDUCTION: This exam was performed according to the departmental dose-optimization program which includes automated exposure control, adjustment of the mA and/or kV according to patient size and/or use of iterative reconstruction technique. CONTRAST:  OMNIPAQUE IOHEXOL 300 MG/ML  SOLN COMPARISON:  CT of the abdomen and pelvis 09/16/2023. FINDINGS: Lower chest: Mild scarring in the right lung base again incidentally noted. Hepatobiliary: Subcentimeter low-attenuation lesion in the right lobe of the liver between segments 5 and 6, too small to  definitively characterize, but stable compared to prior studies and statistically likely to represent a tiny cyst (no imaging follow-up recommended). No other aggressive appearing hepatic lesions. No intra or extrahepatic biliary ductal dilatation. Gallbladder is unremarkable in appearance. Pancreas: No pancreatic mass. No pancreatic ductal dilatation. No pancreatic or peripancreatic fluid collections or inflammatory changes. Spleen: Unremarkable. Adrenals/Urinary Tract: Bilateral kidneys and bilateral adrenal glands are normal in appearance. No hydroureteronephrosis. Urinary bladder is unremarkable in appearance. Stomach/Bowel: The appearance of the stomach is normal. No pathologic dilatation of small bowel or colon. Normal appendix. Vascular/Lymphatic: No significant atherosclerotic disease, aneurysm or dissection noted in the abdominal or pelvic vasculature. IVC filter in position with tip terminating at the level  of the renal veins. No lymphadenopathy noted in the abdomen or pelvis. Reproductive: Uterus and right ovary are unremarkable in appearance. 2.9 x 1.8 cm simple appearing low-attenuation lesion in the left adnexa, presumably a dominant follicle, incidentally noted. Other: No significant volume of ascites.  No pneumoperitoneum. Musculoskeletal: There are no aggressive appearing lytic or blastic lesions noted in the visualized portions of the skeleton. IMPRESSION: 1. No acute findings are noted in the abdomen or pelvis to account for the patient's symptoms. 2. 2.9 x 1.8 cm simple appearing low-attenuation lesion in the left adnexa, presumably a dominant follicle. No imaging follow-up recommended. Electronically Signed   By: Trudie Reed M.D.   On: 09/19/2023 06:09    Procedures Procedures    Medications Ordered in ED Medications  ketorolac (TORADOL) 30 MG/ML injection 30 mg (has no administration in time range)  morphine (PF) 4 MG/ML injection 4 mg (4 mg Intravenous Given 09/19/23 0241)   ondansetron (ZOFRAN) injection 4 mg (4 mg Intravenous Given 09/19/23 0240)  dicyclomine (BENTYL) capsule 10 mg (10 mg Oral Given 09/19/23 0442)  iohexol (OMNIPAQUE) 300 MG/ML solution 100 mL (100 mLs Intravenous Contrast Given 09/19/23 0544)    ED Course/ Medical Decision Making/ A&P Clinical Course as of 09/19/23 2536  Wynelle Link Sep 19, 2023  0631 Patient continuing to complain of abdominal pain.  She is not to my knowledge had an STD workup.  She reports that she is not concerned about STDs and does not have any new sexual partners or vaginal discharge.  Defers pelvic exam.  She thinks she may have a UTI.  She is only having urinary frequency.  Urinalysis is not the most clean specimen and only has 6-10 white cells but no bacteria.  Will send for culture.  Will treat [place] given that she says this is consistent with her prior UTIs. [CH]    Clinical Course User Index [CH] Micheala Morissette, Mayer Masker, MD                                 Medical Decision Making Amount and/or Complexity of Data Reviewed Labs: ordered. Radiology: ordered.  Risk Prescription drug management.   This patient presents to the ED for concern of abdominal pain, this involves an extensive number of treatment options, and is a complaint that carries with it a high risk of complications and morbidity.  I considered the following differential and admission for this acute, potentially life threatening condition.  The differential diagnosis includes gastritis, gastroenteritis, cholecystitis, appendicitis, urinary tract infection, ovarian pathology  MDM:    This is a 43 year old female who presents with recurrent and ongoing abdominal pain.  Was just discharged from the hospital on Friday for partial SBO versus gastroenteritis.  She reports ongoing discomfort.  She is nontoxic.  Vital signs are reassuring.  She has a fairly benign abdominal exam.  Labs obtained and reviewed.  Largely reassuring.  No metabolic derangements.  No  leukocytosis.  Urinalysis is not necessarily consistent with UTI.  Patient reports ongoing pain and discomfort despite medications.  Repeat CT imaging was obtained.  No significant intra-abdominal process.  She has a dominant follicle on her ovary.  I have low suspicion for ovarian torsion.  I did offer a pelvic ultrasound; however she declines.  Patient believes she has a UTI and that her symptoms are consistent with UTI.  I have sent a urine culture.  Given her concerns, reasonable to start antibiotics  until culture results.  Discussed ongoing supportive measures at home.  (Labs, imaging, consults)  Labs: I Ordered, and personally interpreted labs.  The pertinent results include: CBC, CMP, urinalysis  Imaging Studies ordered: I ordered imaging studies including CT I independently visualized and interpreted imaging. I agree with the radiologist interpretation  Additional history obtained from chart review.  External records from outside source obtained and reviewed including prior evaluations  Cardiac Monitoring: The patient was maintained on a cardiac monitor.  If on the cardiac monitor, I personally viewed and interpreted the cardiac monitored which showed an underlying rhythm of: Sinus  Reevaluation: After the interventions noted above, I reevaluated the patient and found that they have :stayed the same  Social Determinants of Health:  lives independently  Disposition: Discharge  Co morbidities that complicate the patient evaluation  Past Medical History:  Diagnosis Date   Asthma    Diabetes mellitus without complication (HCC)    DVT (deep venous thrombosis) (HCC)    Pulmonary emboli (HCC)    Sleep apnea    Thyroid disease      Medicines Meds ordered this encounter  Medications   morphine (PF) 4 MG/ML injection 4 mg   ondansetron (ZOFRAN) injection 4 mg   dicyclomine (BENTYL) capsule 10 mg   iohexol (OMNIPAQUE) 300 MG/ML solution 100 mL   ketorolac (TORADOL) 30 MG/ML  injection 30 mg   cephALEXin (KEFLEX) 500 MG capsule    Sig: Take 1 capsule (500 mg total) by mouth 3 (three) times daily.    Dispense:  21 capsule    Refill:  0    I have reviewed the patients home medicines and have made adjustments as needed  Problem List / ED Course: Problem List Items Addressed This Visit   None Visit Diagnoses       Generalized abdominal pain    -  Primary     Urinary frequency                       Final Clinical Impression(s) / ED Diagnoses Final diagnoses:  Generalized abdominal pain  Urinary frequency    Rx / DC Orders ED Discharge Orders          Ordered    cephALEXin (KEFLEX) 500 MG capsule  3 times daily        09/19/23 0628              Shon Baton, MD 09/19/23 [id number]

## 2023-09-19 NOTE — ED Notes (Signed)
Upon administering Hydroxyzine to patient, patient dropped 1 of the 2 pills on the floor. Dropped pill discarded of in appropriate waste bin and replaced with new pill witnessed by Tobi Bastos, Charity fundraiser.

## 2023-09-19 NOTE — ED Notes (Addendum)
Nurse called into patient's room. Patient is found lying in bed in no apparent distress or discomfort. Patient states "Can I have some dilaudid through my IV? I need something strong". Nurse notified provider of patient's request

## 2023-09-20 LAB — URINE CULTURE
